# Patient Record
Sex: Female | Born: 1945
Health system: Southern US, Community
[De-identification: ages and names within clinical notes are randomized; demographics above are authoritative.]

## PROBLEM LIST (undated history)

## (undated) DIAGNOSIS — R0600 Dyspnea, unspecified: Secondary | ICD-10-CM

## (undated) DIAGNOSIS — F419 Anxiety disorder, unspecified: Secondary | ICD-10-CM

## (undated) DIAGNOSIS — D680 Von Willebrand's disease: Secondary | ICD-10-CM

## (undated) DIAGNOSIS — J449 Chronic obstructive pulmonary disease, unspecified: Secondary | ICD-10-CM

## (undated) DIAGNOSIS — E7870 Disorder of bile acid and cholesterol metabolism, unspecified: Secondary | ICD-10-CM

## (undated) DIAGNOSIS — J189 Pneumonia, unspecified organism: Secondary | ICD-10-CM

## (undated) DIAGNOSIS — M1990 Unspecified osteoarthritis, unspecified site: Secondary | ICD-10-CM

## (undated) DIAGNOSIS — E559 Vitamin D deficiency, unspecified: Secondary | ICD-10-CM

## (undated) DIAGNOSIS — J45909 Unspecified asthma, uncomplicated: Secondary | ICD-10-CM

## (undated) DIAGNOSIS — E785 Hyperlipidemia, unspecified: Secondary | ICD-10-CM

## (undated) DIAGNOSIS — I10 Essential (primary) hypertension: Secondary | ICD-10-CM

## (undated) DIAGNOSIS — E039 Hypothyroidism, unspecified: Secondary | ICD-10-CM

## (undated) HISTORY — DX: Vitamin D deficiency, unspecified: E55.9

## (undated) HISTORY — DX: Hyperlipidemia, unspecified: E78.5

## (undated) HISTORY — PX: BIOPSY ENDOMETRIAL: PRO11

## (undated) HISTORY — PX: TUBAL LIGATION: SHX77

## (undated) HISTORY — DX: Hypothyroidism, unspecified: E03.9

## (undated) HISTORY — DX: Disorder of bile acid and cholesterol metabolism, unspecified: E78.70

## (undated) HISTORY — DX: Chronic obstructive pulmonary disease, unspecified: J44.9

## (undated) HISTORY — DX: Essential (primary) hypertension: I10

## (undated) HISTORY — DX: Von Willebrand's disease: D68.0

---

## 1976-09-08 HISTORY — PX: APPENDECTOMY: SHX54

## 2016-09-18 DIAGNOSIS — R7989 Other specified abnormal findings of blood chemistry: Secondary | ICD-10-CM

## 2016-09-18 DIAGNOSIS — R778 Other specified abnormalities of plasma proteins: Secondary | ICD-10-CM | POA: Insufficient documentation

## 2017-02-27 LAB — TSH: TSH: 9.45 — AB (ref 0.41–5.90)

## 2017-06-11 ENCOUNTER — Encounter: Payer: Self-pay | Admitting: Physician Assistant

## 2018-06-18 LAB — TSH: TSH: 4.5 (ref 0.41–5.90)

## 2018-06-18 LAB — CBC AND DIFFERENTIAL
HEMATOCRIT: 38 (ref 36–46)
Hemoglobin: 12.6 (ref 12.0–16.0)
PLATELETS: 222 (ref 150–399)
WBC: 7.5

## 2018-06-18 LAB — HEPATIC FUNCTION PANEL
ALT: 11 (ref 7–35)
AST: 19 (ref 13–35)
Alkaline Phosphatase: 78 (ref 25–125)
Bilirubin, Total: 0.5

## 2018-06-18 LAB — BASIC METABOLIC PANEL
BUN: 21 (ref 4–21)
Creatinine: 0.9 (ref 0.5–1.1)
Glucose: 80
Potassium: 4.4 (ref 3.4–5.3)
Sodium: 141 (ref 137–147)

## 2018-06-18 LAB — LIPID PANEL
HDL: 48 (ref 35–70)
LDL Cholesterol: 129
Triglycerides: 127 (ref 40–160)

## 2018-06-18 LAB — VITAMIN D 25 HYDROXY (VIT D DEFICIENCY, FRACTURES): Vit D, 25-Hydroxy: 36.9

## 2018-11-03 ENCOUNTER — Encounter: Payer: Self-pay | Admitting: Family Medicine

## 2018-11-03 ENCOUNTER — Ambulatory Visit (INDEPENDENT_AMBULATORY_CARE_PROVIDER_SITE_OTHER): Payer: Medicare HMO

## 2018-11-03 ENCOUNTER — Ambulatory Visit (INDEPENDENT_AMBULATORY_CARE_PROVIDER_SITE_OTHER): Payer: Medicare HMO | Admitting: Family Medicine

## 2018-11-03 ENCOUNTER — Other Ambulatory Visit: Payer: Self-pay | Admitting: Family Medicine

## 2018-11-03 VITALS — BP 115/80 | HR 90 | Wt 199.0 lb

## 2018-11-03 DIAGNOSIS — E039 Hypothyroidism, unspecified: Secondary | ICD-10-CM | POA: Diagnosis not present

## 2018-11-03 DIAGNOSIS — G8929 Other chronic pain: Secondary | ICD-10-CM

## 2018-11-03 DIAGNOSIS — D68318 Other hemorrhagic disorder due to intrinsic circulating anticoagulants, antibodies, or inhibitors: Secondary | ICD-10-CM | POA: Insufficient documentation

## 2018-11-03 DIAGNOSIS — E7870 Disorder of bile acid and cholesterol metabolism, unspecified: Secondary | ICD-10-CM | POA: Diagnosis not present

## 2018-11-03 DIAGNOSIS — J432 Centrilobular emphysema: Secondary | ICD-10-CM | POA: Insufficient documentation

## 2018-11-03 DIAGNOSIS — M160 Bilateral primary osteoarthritis of hip: Secondary | ICD-10-CM | POA: Diagnosis not present

## 2018-11-03 DIAGNOSIS — I10 Essential (primary) hypertension: Secondary | ICD-10-CM | POA: Diagnosis not present

## 2018-11-03 DIAGNOSIS — J449 Chronic obstructive pulmonary disease, unspecified: Secondary | ICD-10-CM | POA: Diagnosis not present

## 2018-11-03 DIAGNOSIS — Z1159 Encounter for screening for other viral diseases: Secondary | ICD-10-CM

## 2018-11-03 DIAGNOSIS — E790 Hyperuricemia without signs of inflammatory arthritis and tophaceous disease: Secondary | ICD-10-CM | POA: Insufficient documentation

## 2018-11-03 DIAGNOSIS — D6800 Von Willebrand disease, unspecified: Secondary | ICD-10-CM

## 2018-11-03 DIAGNOSIS — E785 Hyperlipidemia, unspecified: Secondary | ICD-10-CM

## 2018-11-03 DIAGNOSIS — M25551 Pain in right hip: Secondary | ICD-10-CM | POA: Diagnosis not present

## 2018-11-03 DIAGNOSIS — R0902 Hypoxemia: Secondary | ICD-10-CM | POA: Insufficient documentation

## 2018-11-03 DIAGNOSIS — E559 Vitamin D deficiency, unspecified: Secondary | ICD-10-CM | POA: Diagnosis not present

## 2018-11-03 DIAGNOSIS — M25552 Pain in left hip: Principal | ICD-10-CM

## 2018-11-03 DIAGNOSIS — E7879 Other disorders of bile acid and cholesterol metabolism: Secondary | ICD-10-CM

## 2018-11-03 HISTORY — DX: Hyperlipidemia, unspecified: E78.5

## 2018-11-03 HISTORY — DX: Hypothyroidism, unspecified: E03.9

## 2018-11-03 HISTORY — DX: Von Willebrand's disease: D68.0

## 2018-11-03 HISTORY — DX: Vitamin D deficiency, unspecified: E55.9

## 2018-11-03 HISTORY — DX: Essential (primary) hypertension: I10

## 2018-11-03 HISTORY — DX: Von Willebrand disease, unspecified: D68.00

## 2018-11-03 HISTORY — DX: Other disorders of bile acid and cholesterol metabolism: E78.79

## 2018-11-03 HISTORY — DX: Chronic obstructive pulmonary disease, unspecified: J44.9

## 2018-11-03 LAB — URIC ACID: Uric Acid: 8.5

## 2018-11-03 MED ORDER — COLCHICINE 0.6 MG PO TABS: 0.6000 mg | ORAL_TABLET | Freq: Every day | ORAL | 1 refills | Status: DC

## 2018-11-03 NOTE — Progress Notes (Signed)
Subjective:    CC: Bilateral hip pain  HPI: Jennifer [name] presents to clinic for first visit for hip pain.  She recently moved to the area from the [place] area.  She has an extensive medical history significant for COPD hypertension and hypothyroidism.  She has been having hip pain since about November.  She is not had much evaluation for it and has tried using Tylenol ibuprofen and Aleve which helps only a little.  She notes anterior hip pain worse when she stands from a seated position.  She notes the pain is mostly in the anterior hip.  She denies any radiating pain below the level of her knee or distal weakness or numbness.  She is not had evaluation for this problem yet.  She has scheduled her first visit with my partner Rosita Kea PA on March 6. Jennifer [name] brings a printout of the last several years of her primary care office visits.  Past medical history, Surgical history, Family history not pertinant except as noted below, Social history, Allergies, and medications have been entered into the medical record, reviewed, and no changes needed.   Review of Systems: No headache, visual changes, nausea, vomiting, diarrhea, constipation, dizziness, abdominal pain, skin rash, fevers, chills, night sweats, weight loss, swollen lymph nodes, body aches, joint swelling, muscle aches, chest pain, shortness of breath, mood changes, visual or auditory hallucinations.   Objective:    Vitals:   11/03/18 1056 11/03/18 1129  BP: 115/80   Pulse: (!) 139 90   General: Well Developed, well nourished, and in no acute distress.  Neuro/Psych: Alert and oriented x3, extra-ocular muscles intact, able to move all 4 extremities, sensation grossly intact. Skin: Warm and dry, no rashes noted.  Respiratory: Not using accessory muscles, speaking in full sentences, trachea midline.  Cardiovascular: Pulses palpable, no extremity edema. Abdomen: Does not appear distended. MSK:  Hips bilaterally  normal-appearing. Right hip: Significant limited rotational range of motion with mild. Decreased flexion range of motion with mild pain.  Left hip: Significant limited rotational range of motion and decreased hip flexion range of motion.  Motion produces pain as well.  Pain with strength testing.  Patient ambulates with a cane with a significant antalgic gait.  Lab and Radiology Results X-ray images hips bilaterally personally independently reviewed  Right hip: Degenerative changes with dysplasia appearance.  Possible AVN with partial collapse appearance as well.  Left hip: Degenerative changes without significant dysplasia or AVN appearance.  No acute fractures.  Await formal radiology review  Impression and Recommendations:    Assessment and Plan: 73 y.o. female with  Hip pain bilateral: Based on physical exam and history DJD is very likely.  She does have some evidence of hip dysplasia on x-ray per my read and possible AVN per my read.  Await formal radiology review.  Additionally she has history of elevated uric acid and this could be gout.  Gout is less likely in the hip however.  Reasonable to try treatment with colchicine.  Likely will proceed with physical therapy as well.  Recheck in 1 month.  Additionally will check basic fasting labs listed below to follow-up hypothyroidism gout hyperlipidemia etc.  She has a follow-up appointment with her new primary care provider sixth..   Orders Placed This Encounter  Procedures  . DG HIPS BILAT WITH PELVIS 3-4 VIEWS    Standing Status:   Future    Number of Occurrences:   1    Standing Expiration Date:   01/02/2020  Order Specific Question:   Reason for Exam (SYMPTOM  OR DIAGNOSIS REQUIRED)    Answer:   eval pain hips bilateral    Order Specific Question:   Preferred imaging location?    Answer:   [facility]    Order Specific Question:   Radiology Contrast Protocol - do NOT remove file path    Answer:    [email or web address]  . CBC and differential    This external order was created through the Results Console.  Marland Kitchen VITAMIN D 25 Hydroxy (Vit-D Deficiency, Fractures)    This external order was created through the Results Console.  . Basic metabolic panel    This external order was created through the Results Console.  . Lipid panel    This external order was created through the Results Console.  . Hepatic function panel    This external order was created through the Results Console.  . TSH    This external order was created through the Results Console.  . TSH    This external order was created through the Results Console.  Marland Kitchen Uric acid    This order was created through External Result Entry  . CBC  . COMPLETE METABOLIC PANEL WITH GFR  . Lipid Panel w/reflex Direct LDL  . TSH  . T4, free  . T3, free  . Hepatitis C antibody  . Uric acid   Meds ordered this encounter  Medications  . colchicine 0.6 MG tablet    Sig: Take 1 tablet (0.6 mg total) by mouth daily.    Dispense:  30 tablet    Refill:  1    Discussed warning signs or symptoms. Please see discharge instructions. Patient expresses understanding.

## 2018-11-03 NOTE — Patient Instructions (Addendum)
Thank you for coming in today. Start colchicine daily for pain,  I will get xray results to you ASAP.  If not controlled we can do an injection in the hip here.   Get labs and follow up with [facility] as scheduled.   Recheck with me in 4 weeks or sooner if needed.    Avascular Necrosis Avascular necrosis is death of bone tissue due to lack of blood supply. This condition may also be called:  Osteonecrosis.  Aseptic necrosis.  Ischemic bone necrosis. Without proper blood supply, the internal layer of the affected bone dies. Over time, small breaks form in the outer layer of the bone. If this process affects a bone near a joint, the joint may collapse or move out of place (become dislocated). Joints that may be affected include the jaw, wrist, fingers, knee, and foot. Avascular necrosis most commonly affects:  The hip joint, especially the top of the thigh bone (femoral head).  The top of the upper arm bone (humeral head). What are the causes? This condition may be caused by:  Damage or injury to a bone or joint.  Using steroid medicine such as prednisone for a long time.  Changes in the body's disease-fighting system (immune system).  Changes in the body's system of chemicals that regulate body processes (hormones).  A lot of exposure to radiation. What increases the risk? The following factors may make you more likely to develop this condition:  Alcohol abuse.  A history of joint injury.  Long-term or frequent use of steroid medicines.  Having a medical condition such as: ? HIV or AIDS. ? Diabetes. ? Sickle cell disease. ? A disease in which your body's immune system attacks your body's own healthy tissues (autoimmune disease). What are the signs or symptoms? The main symptoms of avascular necrosis are:  Pain. If an affected joint collapses, the pain may suddenly get severe.  Decreased ability to move the affected bone or joint. How is this diagnosed? Avascular  necrosis may be diagnosed based on:  Your symptoms and medical history.  A physical exam.  Imaging tests, such as X-rays, bone scans, and an MRI. How is this treated? Treatment for this condition may include:  Pain medicine, such as NSAIDs.  Medicine to improve bone growth.  Avoiding placing any pressure or weight on the affected area. If avascular necrosis occurs in your hip, ankle, or foot, you may need to use a device to help you move around (assistive device), such as crutches or a rolling scooter.  Physical therapy to help regain strength and motion in the affected area.  Surgery, such as: ? Core decompression. In this surgery, one or more holes are placed in the bone for new blood vessels to grow into. This provides a renewed blood supply to the bone. This surgery may reduce pain and pressure in the affected bone and slow the destruction of bones and joints. ? Osteotomy. In this surgery, the bone is reshaped to reduce stress on the affected area of the joint. ? Bone grafting. In this surgery, healthy bone from a different part of your body is used to replace damaged bone. ? Total joint replacement (arthroplasty). In this surgery, the affected surfaces of bone on one or both sides of a joint are replaced with artificial parts (prostheses).  Electrical stimulation (also called e-stim). During this procedure, a probe over the skin sends shock waves into the body. This may help to encourage new bone growth.  High-pressure oxygen therapy (hyperbaric  oxygen). This is rarely used. Follow these instructions at home: Activity  Ask your health care provider what activities are safe for you.  If physical therapy was prescribed, do exercises as told by your health care provider.  Avoid placing any pressure or weight on the affected area, as told by your health care provider. Use assistive devices as instructed. Managing pain, stiffness, and swelling  If directed, apply heat to the  affected area as often as told by your health care provider. Heat can reduce the stiffness of your muscles and joints. Use the heat source that your health care provider recommends, such as a moist heat pack or a heating pad. ? Place a towel between your skin and the heat source. ? Leave the heat on for 20-30 minutes. ? Remove the heat if your skin turns bright red. This is especially important if you are unable to feel pain, heat, or cold. You may have a greater risk of getting burned.  If directed, put ice on affected areas. Icing can help to relieve joint pain and swelling. ? Put ice in a plastic bag. ? Place a towel between your skin and the bag. ? Leave the ice on for 20 minutes, 2-3 times a day. General instructions   Take over-the-counter and prescription medicines only as told by your health care provider.  Do not drive or use heavy machinery while taking prescription pain medicine.  If a bone in your arm or leg is affected, ask your health care provider if it is safe for you to drive.  Do not use any products that contain nicotine or tobacco, such as cigarettes and e-cigarettes. These can delay bone healing. If you need help quitting, ask your health care provider.  Do not drink alcohol.  Keep all follow-up visits as told by your health care provider. This is important. Contact a health care provider if:  You have pain that gets worse or does not get better with medicine.  Your ability to move your joint gets worse. Get help right away if:  Your pain suddenly becomes severe. Summary  Avascular necrosis is death of bone tissue due to a lack of blood supply.  Without proper blood supply, the internal layer of the affected bone dies. Over time, small breaks form in the outer layer of the bone.  Avoid putting any pressure or weight on the affected area. You may need to use a device to help you move around (assistive device), such as crutches or a rolling scooter. This  information is not intended to replace advice given to you by your health care provider. Make sure you discuss any questions you have with your health care provider. Document Released: 02/14/2002 Document Revised: 08/04/2017 Document Reviewed: 08/04/2017 Elsevier Interactive Patient Education  2019 [employer]  Gout  Gout is a condition that causes painful swelling of the joints. Gout is a type of inflammation of the joints (arthritis). This condition is caused by having too much uric acid in the body. Uric acid is a chemical that forms when the body breaks down substances called purines. Purines are important for building body proteins. When the body has too much uric acid, sharp crystals can form and build up inside the joints. This causes pain and swelling. Gout attacks can happen quickly and may be very painful (acute gout). Over time, the attacks can affect more joints and become more frequent (chronic gout). Gout can also cause uric acid to build up under the skin and inside  the kidneys. What are the causes? This condition is caused by too much uric acid in your blood. This can happen because:  Your kidneys do not remove enough uric acid from your blood. This is the most common cause.  Your body makes too much uric acid. This can happen with some cancers and cancer treatments. It can also occur if your body is breaking down too many red blood cells (hemolytic anemia).  You eat too many foods that are high in purines. These foods include organ meats and some seafood. Alcohol, especially beer, is also high in purines. A gout attack may be triggered by trauma or stress. What increases the risk? You are more likely to develop this condition if you:  Have a family history of gout.  Are female and middle-aged.  Are female and have gone through menopause.  Are obese.  Frequently drink alcohol, especially beer.  Are dehydrated.  Lose weight too quickly.  Have an organ  transplant.  Have lead poisoning.  Take certain medicines, including aspirin, cyclosporine, diuretics, levodopa, and niacin.  Have kidney disease.  Have a skin condition called psoriasis. What are the signs or symptoms? An attack of acute gout happens quickly. It usually occurs in just one joint. The most common place is the big toe. Attacks often start at night. Other joints that may be affected include joints of the feet, ankle, knee, fingers, wrist, or elbow. Symptoms of this condition may include:  Severe pain.  Warmth.  Swelling.  Stiffness.  Tenderness. The affected joint may be very painful to touch.  Shiny, red, or purple skin.  Chills and fever. Chronic gout may cause symptoms more frequently. More joints may be involved. You may also have white or yellow lumps (tophi) on your hands or feet or in other areas near your joints. How is this diagnosed? This condition is diagnosed based on your symptoms, medical history, and physical exam. You may have tests, such as:  Blood tests to measure uric acid levels.  Removal of joint fluid with a thin needle (aspiration) to look for uric acid crystals.  X-rays to look for joint damage. How is this treated? Treatment for this condition has two phases: treating an acute attack and preventing future attacks. Acute gout treatment may include medicines to reduce pain and swelling, including:  NSAIDs.  Steroids. These are strong anti-inflammatory medicines that can be taken by mouth (orally) or injected into a joint.  Colchicine. This medicine relieves pain and swelling when it is taken soon after an attack. It can be given by mouth or through an IV. Preventive treatment may include:  Daily use of smaller doses of NSAIDs or colchicine.  Use of a medicine that reduces uric acid levels in your blood.  Changes to your diet. You may need to see a dietitian about what to eat and drink to prevent gout. Follow these instructions at  home: During a gout attack   If directed, put ice on the affected area: ? Put ice in a plastic bag. ? Place a towel between your skin and the bag. ? Leave the ice on for 20 minutes, 2-3 times a day.  Raise (elevate) the affected joint above the level of your heart as often as possible.  Rest the joint as much as possible. If the affected joint is in your leg, you may be given crutches to use.  Follow instructions from your health care provider about eating or drinking restrictions. Avoiding future gout attacks  Follow  a low-purine diet as told by your dietitian or health care provider. Avoid foods and drinks that are high in purines, including liver, kidney, anchovies, asparagus, herring, mushrooms, mussels, and beer.  Maintain a healthy weight or lose weight if you are overweight. If you want to lose weight, talk with your health care provider. It is important that you do not lose weight too quickly.  Start or maintain an exercise program as told by your health care provider. Eating and drinking  Drink enough fluids to keep your urine pale yellow.  If you drink alcohol: ? Limit how much you use to:  0-1 drink a day for women.  0-2 drinks a day for men. ? Be aware of how much alcohol is in your drink. In the [place] one drink equals one 12 oz bottle of beer (355 mL) one 5 oz glass of wine (148 mL), or one 1 oz glass of hard liquor (44 mL). General instructions  Take over-the-counter and prescription medicines only as told by your health care provider.  Do not drive or use heavy machinery while taking prescription pain medicine.  Return to your normal activities as told by your health care provider. Ask your health care provider what activities are safe for you.  Keep all follow-up visits as told by your health care provider. This is important. Contact a health care provider if you have:  Another gout attack.  Continuing symptoms of a gout attack after 10 days of  treatment.  Side effects from your medicines.  Chills or a fever.  Burning pain when you urinate.  Pain in your lower back or belly. Get help right away if you:  Have severe or uncontrolled pain.  Cannot urinate. Summary  Gout is painful swelling of the joints caused by inflammation.  The most common site of pain is the big toe, but it can affect other joints in the body.  Medicines and dietary changes can help to prevent and treat gout attacks. This information is not intended to replace advice given to you by your health care provider. Make sure you discuss any questions you have with your health care provider. Document Released: 08/22/2000 Document Revised: 03/17/2018 Document Reviewed: 03/17/2018 Elsevier Interactive Patient Education  2019 [employer].

## 2018-11-04 ENCOUNTER — Telehealth: Payer: Self-pay | Admitting: Family Medicine

## 2018-11-04 DIAGNOSIS — M25551 Pain in right hip: Secondary | ICD-10-CM

## 2018-11-04 DIAGNOSIS — M25552 Pain in left hip: Principal | ICD-10-CM

## 2018-11-04 DIAGNOSIS — M160 Bilateral primary osteoarthritis of hip: Secondary | ICD-10-CM

## 2018-11-04 LAB — COMPLETE METABOLIC PANEL WITH GFR
AG RATIO: 1.6 (calc) (ref 1.0–2.5)
ALT: 15 U/L (ref 6–29)
AST: 16 U/L (ref 10–35)
Albumin: 4.4 g/dL (ref 3.6–5.1)
Alkaline phosphatase (APISO): 95 U/L (ref 37–153)
BUN/Creatinine Ratio: 30 (calc) — ABNORMAL HIGH (ref 6–22)
BUN: 27 mg/dL — ABNORMAL HIGH (ref 7–25)
CO2: 25 mmol/L (ref 20–32)
Calcium: 10.7 mg/dL — ABNORMAL HIGH (ref 8.6–10.4)
Chloride: 100 mmol/L (ref 98–110)
Creat: 0.9 mg/dL (ref 0.60–0.93)
GFR, Est African American: 75 mL/min/{1.73_m2} (ref >=60)
GFR, Est Non African American: 65 mL/min/{1.73_m2} (ref >=60)
Globulin: 2.8 g/dL (calc) (ref 1.9–3.7)
Glucose, Bld: 91 mg/dL (ref 65–99)
POTASSIUM: 3.9 mmol/L (ref 3.5–5.3)
Sodium: 138 mmol/L (ref 135–146)
Total Bilirubin: 0.7 mg/dL (ref 0.2–1.2)
Total Protein: 7.2 g/dL (ref 6.1–8.1)

## 2018-11-04 LAB — T4, FREE: Free T4: 1.4 ng/dL (ref 0.8–1.8)

## 2018-11-04 LAB — CBC
HCT: 37.3 % (ref 35.0–45.0)
Hemoglobin: 13.1 g/dL (ref 11.7–15.5)
MCH: 32.2 pg (ref 27.0–33.0)
MCHC: 35.1 g/dL (ref 32.0–36.0)
MCV: 91.6 fL (ref 80.0–100.0)
MPV: 9.8 fL (ref 7.5–12.5)
Platelets: 241 10*3/uL (ref 140–400)
RBC: 4.07 10*6/uL (ref 3.80–5.10)
RDW: 12 % (ref 11.0–15.0)
WBC: 9.8 10*3/uL (ref 3.8–10.8)

## 2018-11-04 LAB — LIPID PANEL W/REFLEX DIRECT LDL
Cholesterol: 213 mg/dL — ABNORMAL HIGH (ref <200)
HDL: 53 mg/dL (ref >=50)
LDL Cholesterol (Calc): 137 mg/dL (calc) — ABNORMAL HIGH
Non-HDL Cholesterol (Calc): 160 mg/dL (calc) — ABNORMAL HIGH (ref <130)
Total CHOL/HDL Ratio: 4 (calc) (ref <5.0)
Triglycerides: 113 mg/dL (ref <150)

## 2018-11-04 LAB — TSH: TSH: 4.2 mIU/L (ref 0.40–4.50)

## 2018-11-04 LAB — URIC ACID: Uric Acid, Serum: 6.3 mg/dL (ref 2.5–7.0)

## 2018-11-04 LAB — HEPATITIS C ANTIBODY
Hepatitis C Ab: NONREACTIVE
SIGNAL TO CUT-OFF: 0.01 (ref <1.00)

## 2018-11-04 LAB — T3, FREE: T3, Free: 2.9 pg/mL (ref 2.3–4.2)

## 2018-11-04 NOTE — Telephone Encounter (Signed)
Referral to physical therapy placed today.

## 2018-11-12 ENCOUNTER — Encounter: Payer: Self-pay | Admitting: Physician Assistant

## 2018-11-12 ENCOUNTER — Ambulatory Visit (INDEPENDENT_AMBULATORY_CARE_PROVIDER_SITE_OTHER): Payer: Medicare HMO | Admitting: Physician Assistant

## 2018-11-12 VITALS — BP 129/82 | HR 92 | Resp 14 | Ht 66.0 in | Wt 198.0 lb

## 2018-11-12 DIAGNOSIS — Z9981 Dependence on supplemental oxygen: Secondary | ICD-10-CM

## 2018-11-12 DIAGNOSIS — Z7689 Persons encountering health services in other specified circumstances: Secondary | ICD-10-CM

## 2018-11-12 DIAGNOSIS — J432 Centrilobular emphysema: Secondary | ICD-10-CM | POA: Diagnosis not present

## 2018-11-12 DIAGNOSIS — I10 Essential (primary) hypertension: Secondary | ICD-10-CM | POA: Diagnosis not present

## 2018-11-12 DIAGNOSIS — Z9109 Other allergy status, other than to drugs and biological substances: Secondary | ICD-10-CM | POA: Diagnosis not present

## 2018-11-12 MED ORDER — LISINOPRIL-HYDROCHLOROTHIAZIDE 20-25 MG PO TABS: 1.0000 | ORAL_TABLET | Freq: Every day | ORAL | 1 refills | Status: DC

## 2018-11-12 MED ORDER — MONTELUKAST SODIUM 10 MG PO TABS: 10.0000 mg | ORAL_TABLET | Freq: Every day | ORAL | 2 refills | Status: DC

## 2018-11-12 MED ORDER — ALBUTEROL SULFATE HFA 108 (90 BASE) MCG/ACT IN AERS: 1.0000 | INHALATION_SPRAY | RESPIRATORY_TRACT | 5 refills | Status: DC | PRN

## 2018-11-12 MED ORDER — SYMBICORT 160-4.5 MCG/ACT IN AERO: 2.0000 | INHALATION_SPRAY | Freq: Two times a day (BID) | RESPIRATORY_TRACT | 6 refills | Status: DC

## 2018-11-12 NOTE — Progress Notes (Signed)
HPI:                                                                Jennifer [name] is a 73 y.o. female who presents to [facility]: Primary Care Sports Medicine today to establish care   History of Thyroid Disease: reports she has taken Levothyroxine in the past, but has not taken thyroid replacement since 2018 when TSH was 9.45. Denies chest pain, heart palpitations, tremor, GI symptoms, or skin changes. No family hx of thyroid disease.  COPD Reports last exacerbation in January 2018 - she was admitted for 17 days CT chest showed moderate centrilobular emphysematous change She uses home oxygen as needed, mainly when she is cleaning her house. Usually needs 3L Used to be on Spiriva but she was switched to Symbicort due to cost.  States she does not want a Pulmonologist She does not have any wheezing Needs a refill of Ventolin. On average uses 1 inhaler every 3 months  HTN: taking Lisinopril-HCTZ daily. Compliant with medications. Endorses history of white coat hypertension. Denies vision change, headache, chest pain with exertion, orthopnea, lightheadedness, syncope and edema. Risk factors include: postmenopausal, obesity, HLD  Depression screen [facility] 2/9 11/12/2018  Decreased Interest 0  Down, Depressed, Hopeless 0  PHQ - 2 Score 0    No flowsheet data found.    Past Medical History:  Diagnosis Date  . COPD (chronic obstructive pulmonary disease) (HCC) 11/03/2018  . Familial hypercholanemia 11/03/2018  . HLD (hyperlipidemia) 11/03/2018  . HTN (hypertension) 11/03/2018  . Hypothyroid 11/03/2018  . Vitamin D deficiency 11/03/2018  . Von Willebrand disease (HCC) possible 11/03/2018   Seen in documentation from prior provider   Past Surgical History:  Procedure Laterality Date  . APPENDECTOMY  1978  . BIOPSY ENDOMETRIAL    . CESAREAN SECTION     triplets  . TUBAL LIGATION     Social History   Tobacco Use  . Smoking status: Former Smoker    Packs/day: 0.50     Years: 15.00    Pack years: 7.50    Types: Cigarettes    Last attempt to quit: 07/22/2014    Years since quitting: 4.3  . Smokeless tobacco: Never Used  Substance Use Topics  . Alcohol use: Not on file   family history includes Asthma in her brother; Cancer in her father; Parkinson's disease in her mother.   Review of Systems  Respiratory: Positive for shortness of breath and wheezing (asthma).   Musculoskeletal: Positive for arthralgias.  All other systems reviewed and are negative.    Medications: Current Outpatient Medications  Medication Sig Dispense Refill  . acetaminophen (TYLENOL) 650 MG CR tablet Take 650 mg by mouth every 8 (eight) hours as needed.    . naproxen sodium (ALEVE) 220 MG tablet Take 220 mg by mouth at bedtime.    Marland Kitchen albuterol (PROVENTIL HFA;VENTOLIN HFA) 108 (90 Base) MCG/ACT inhaler Inhale 1-2 puffs into the lungs every 4 (four) hours as needed for wheezing or shortness of breath. 1 Inhaler 5  . ALPRAZolam (XANAX) 0.5 MG tablet Take by mouth.    . colchicine 0.6 MG tablet Take 1 tablet (0.6 mg total) by mouth daily. 30 tablet 1  . lisinopril-hydrochlorothiazide (PRINZIDE,ZESTORETIC) 20-25 MG tablet Take 1 tablet  by mouth daily. 90 tablet 1  . montelukast (SINGULAIR) 10 MG tablet Take 1 tablet (10 mg total) by mouth at bedtime. 90 tablet 2  . SYMBICORT 160-4.5 MCG/ACT inhaler Inhale 2 puffs into the lungs 2 (two) times daily. 1 Inhaler 6   No current facility-administered medications for this visit.    Allergies  Allergen Reactions  . Prednisone Other (See Comments)    Other reaction(s): psychological reaction, hallucinations  . Statins Other (See Comments)    Other reaction(s): muscle/joint pain Weakness and tiredness       Objective:  BP 129/82   Pulse 92   Resp 14   Ht  (1.676 m)   Wt 198 lb (89.8 kg)   SpO2 95%   BMI 31.96 kg/m  Gen:  alert, not ill-appearing, no distress, appropriate for age, obese female HEENT: head  normocephalic without obvious abnormality, conjunctiva and cornea clear, wearing glasses, trachea midline Pulm: Normal work of breathing, normal phonation, clear to auscultation bilaterally, no wheezes, rales or rhonchi CV: Normal rate, regular rhythm, s1 and s2 distinct, no murmurs, clicks or rubs  Neuro: alert and oriented x 3, no tremor MSK: extremities atraumatic, normal gait and station, no peripheral edema Skin: intact, no rashes on exposed skin, no jaundice, no cyanosis Psych: well-groomed, cooperative, good eye contact, euthymic mood, affect mood-congruent, speech is articulate, and thought processes clear and goal-directed  Lab Results  Component Value Date   CREATININE 0.90 11/03/2018   BUN 27 (H) 11/03/2018   NA 138 11/03/2018   K 3.9 11/03/2018   CL 100 11/03/2018   CO2 25 11/03/2018   Lab Results  Component Value Date   ALT 15 11/03/2018   AST 16 11/03/2018   ALKPHOS 78 06/18/2018   BILITOT 0.7 11/03/2018   Lab Results  Component Value Date   TSH 4.20 11/03/2018   Lab Results  Component Value Date   LABURIC 6.3 11/03/2018     No results found for this or any previous visit (from the past 72 hour(s)). No results found.    Assessment and Plan: 73 y.o. female with   .[name] was seen today for establish care.  Diagnoses and all orders for this visit:  Encounter to establish care  White coat syndrome with diagnosis of hypertension  Centrilobular emphysema (HCC) -     albuterol (PROVENTIL HFA;VENTOLIN HFA) 108 (90 Base) MCG/ACT inhaler; Inhale 1-2 puffs into the lungs every 4 (four) hours as needed for wheezing or shortness of breath. -     SYMBICORT 160-4.5 MCG/ACT inhaler; Inhale 2 puffs into the lungs 2 (two) times daily.  Hypercalcemia -     PTH, Intact and Calcium  Hypertension goal BP (blood pressure) < 140/90 -     lisinopril-hydrochlorothiazide (PRINZIDE,ZESTORETIC) 20-25 MG tablet; Take 1 tablet by mouth daily.  Environmental allergies -      montelukast (SINGULAIR) 10 MG tablet; Take 1 tablet (10 mg total) by mouth at bedtime.  On home oxygen therapy Comments: 3L prn   - Personally reviewed PMH, PSH, PFH, medications, allergies, HM - Age-appropriate cancer screening: overdue for mammogram and colon cancer screening; patient states she does not wish to do any cancer screening because she is very aware of her body. Explained that many early cancers, which are treatable, are asymptomatic. Risks and benefits of screening explained - Tdap UTD - Declines influenza and pneumonia vaccines - PHQ2 negative - Personally reviewed lab results from 11/03/2018. Explained mild elevation in calcium and recommended f/u PTH and  intact calcium test. Patient deferred today - Refills provided as above    Patient education and anticipatory guidance given Patient agrees with treatment plan Follow-up in 6 months for med mgmt or sooner as needed if symptoms worsen or fail to improve  Levonne Hubert PA-C

## 2018-11-12 NOTE — Patient Instructions (Addendum)
For your blood pressure: - Goal <140/90 (Ideally 120's/70's) - take your blood pressure medication in the morning (unless instructed differently) - monitor and log blood pressures at home - check around the same time each day in a relaxed setting - Limit salt to <2500 mg/day - Follow DASH (Dietary Approach to Stopping Hypertension) eating plan - Try to get at least 150 minutes of aerobic exercise per week - Aim to go on a brisk walk 30 minutes per day at least 5 days per week. If you're not active, gradually increase how long you walk by 5 minutes each week - limit alcohol: 2 standard drinks per day for men and 1 per day for women - avoid tobacco/nicotine products. Consider smoking cessation if you smoke - weight loss: 7% of current body weight can reduce your blood pressure by 5-10 points - follow-up at least every 6 months for your blood pressure. Follow-up sooner if your BP is not controlled   DASH Eating Plan DASH stands for "Dietary Approaches to Stop Hypertension." The DASH eating plan is a healthy eating plan that has been shown to reduce high blood pressure (hypertension). It may also reduce your risk for type 2 diabetes, heart disease, and stroke. The DASH eating plan may also help with weight loss. What are tips for following this plan?  General guidelines  Avoid eating more than 2,300 mg (milligrams) of salt (sodium) a day. If you have hypertension, you may need to reduce your sodium intake to 1,500 mg a day.  Limit alcohol intake to no more than 1 drink a day for nonpregnant women and 2 drinks a day for men. One drink equals 12 oz of beer, 5 oz of wine, or 1 oz of hard liquor.  Work with your health care provider to maintain a healthy body weight or to lose weight. Ask what an ideal weight is for you.  Get at least 30 minutes of exercise that causes your heart to beat faster (aerobic exercise) most days of the week. Activities may include walking, swimming, or biking.  Work  with your health care provider or diet and nutrition specialist (dietitian) to adjust your eating plan to your individual calorie needs. Reading food labels   Check food labels for the amount of sodium per serving. Choose foods with less than 5 percent of the Daily Value of sodium. Generally, foods with less than 300 mg of sodium per serving fit into this eating plan.  To find whole grains, look for the word "whole" as the first word in the ingredient list. Shopping  Buy products labeled as "low-sodium" or "no salt added."  Buy fresh foods. Avoid canned foods and premade or frozen meals. Cooking  Avoid adding salt when cooking. Use salt-free seasonings or herbs instead of table salt or sea salt. Check with your health care provider or pharmacist before using salt substitutes.  Do not fry foods. Cook foods using healthy methods such as baking, boiling, grilling, and broiling instead.  Cook with heart-healthy oils, such as olive, canola, soybean, or sunflower oil. Meal planning  Eat a balanced diet that includes: ? 5 or more servings of fruits and vegetables each day. At each meal, try to fill half of your plate with fruits and vegetables. ? Up to 6-8 servings of whole grains each day. ? Less than 6 oz of lean meat, poultry, or fish each day. A 3-oz serving of meat is about the same size as a deck of cards. One egg equals 1  oz. ? 2 servings of low-fat dairy each day. ? A serving of nuts, seeds, or beans 5 times each week. ? Heart-healthy fats. Healthy fats called Omega-3 fatty acids are found in foods such as flaxseeds and coldwater fish, like sardines, salmon, and mackerel.  Limit how much you eat of the following: ? Canned or prepackaged foods. ? Food that is high in trans fat, such as fried foods. ? Food that is high in saturated fat, such as fatty meat. ? Sweets, desserts, sugary drinks, and other foods with added sugar. ? Full-fat dairy products.  Do not salt foods before  eating.  Try to eat at least 2 vegetarian meals each week.  Eat more home-cooked food and less restaurant, buffet, and fast food.  When eating at a restaurant, ask that your food be prepared with less salt or no salt, if possible. What foods are recommended? The items listed may not be a complete list. Talk with your dietitian about what dietary choices are best for you. Grains Whole-grain or whole-wheat bread. Whole-grain or whole-wheat pasta. Brown rice. [place]row. Bulgur. Whole-grain and low-sodium cereals. Pita bread. Low-fat, low-sodium crackers. Whole-wheat flour tortillas. Vegetables Fresh or frozen vegetables (raw, steamed, roasted, or grilled). Low-sodium or reduced-sodium tomato and vegetable juice. Low-sodium or reduced-sodium tomato sauce and tomato paste. Low-sodium or reduced-sodium canned vegetables. Fruits All fresh, dried, or frozen fruit. Canned fruit in natural juice (without added sugar). Meat and other protein foods Skinless chicken or [place]. Ground chicken or [place]. Pork with fat trimmed off. Fish and seafood. Egg whites. Dried beans, peas, or lentils. Unsalted nuts, nut butters, and seeds. Unsalted canned beans. Lean cuts of beef with fat trimmed off. Low-sodium, lean deli meat. Dairy Low-fat (1%) or fat-free (skim) milk. Fat-free, low-fat, or reduced-fat cheeses. Nonfat, low-sodium ricotta or cottage cheese. Low-fat or nonfat yogurt. Low-fat, low-sodium cheese. Fats and oils Soft margarine without trans fats. Vegetable oil. Low-fat, reduced-fat, or light mayonnaise and salad dressings (reduced-sodium). Canola, safflower, olive, soybean, and sunflower oils. Avocado. Seasoning and other foods Herbs. Spices. Seasoning mixes without salt. Unsalted popcorn and pretzels. Fat-free sweets. What foods are not recommended? The items listed may not be a complete list. Talk with your dietitian about what dietary choices are best for you. Grains Baked goods made with fat,  such as croissants, muffins, or some breads. Dry pasta or rice meal packs. Vegetables Creamed or fried vegetables. Vegetables in a cheese sauce. Regular canned vegetables (not low-sodium or reduced-sodium). Regular canned tomato sauce and paste (not low-sodium or reduced-sodium). Regular tomato and vegetable juice (not low-sodium or reduced-sodium). Angie Fava. Olives. Fruits Canned fruit in a light or heavy syrup. Fried fruit. Fruit in cream or butter sauce. Meat and other protein foods Fatty cuts of meat. Ribs. Fried meat. Berniece Salines. Sausage. Bologna and other processed lunch meats. Salami. Fatback. Hotdogs. Bratwurst. Salted nuts and seeds. Canned beans with added salt. Canned or smoked fish. Whole eggs or egg yolks. Chicken or [place] with skin. Dairy Whole or 2% milk, cream, and half-and-half. Whole or full-fat cream cheese. Whole-fat or sweetened yogurt. Full-fat cheese. Nondairy creamers. Whipped toppings. Processed cheese and cheese spreads. Fats and oils Butter. Stick margarine. Lard. Shortening. Ghee. Bacon fat. Tropical oils, such as coconut, palm kernel, or palm oil. Seasoning and other foods Salted popcorn and pretzels. Onion salt, garlic salt, seasoned salt, table salt, and sea salt. Worcestershire sauce. Tartar sauce. Barbecue sauce. Teriyaki sauce. Soy sauce, including reduced-sodium. Steak sauce. Canned and packaged gravies. Fish sauce. Oyster sauce.  Cocktail sauce. Horseradish that you find on the shelf. Ketchup. Mustard. Meat flavorings and tenderizers. Bouillon cubes. Hot sauce and Tabasco sauce. Premade or packaged marinades. Premade or packaged taco seasonings. Relishes. Regular salad dressings. Where to find more information:  National Heart, Lung, and Blood Institute: PopSteam.is  American Heart Association: www.heart.org Summary  The DASH eating plan is a healthy eating plan that has been shown to reduce high blood pressure (hypertension). It may also reduce your risk for  type 2 diabetes, heart disease, and stroke.  With the DASH eating plan, you should limit salt (sodium) intake to 2,300 mg a day. If you have hypertension, you may need to reduce your sodium intake to 1,500 mg a day.  When on the DASH eating plan, aim to eat more fresh fruits and vegetables, whole grains, lean proteins, low-fat dairy, and heart-healthy fats.  Work with your health care provider or diet and nutrition specialist (dietitian) to adjust your eating plan to your individual calorie needs. This information is not intended to replace advice given to you by your health care provider. Make sure you discuss any questions you have with your health care provider. Document Released: 08/14/2011 Document Revised: 08/18/2016 Document Reviewed: 08/18/2016 Elsevier Interactive Patient Education  2019 [employer].

## 2018-11-18 ENCOUNTER — Encounter: Payer: Self-pay | Admitting: Rehabilitative and Restorative Service Providers"

## 2018-11-18 ENCOUNTER — Other Ambulatory Visit: Payer: Self-pay

## 2018-11-18 ENCOUNTER — Ambulatory Visit: Payer: Medicare HMO | Admitting: Rehabilitative and Restorative Service Providers"

## 2018-11-18 DIAGNOSIS — M25552 Pain in left hip: Secondary | ICD-10-CM

## 2018-11-18 DIAGNOSIS — R29898 Other symptoms and signs involving the musculoskeletal system: Secondary | ICD-10-CM

## 2018-11-18 DIAGNOSIS — R2689 Other abnormalities of gait and mobility: Secondary | ICD-10-CM

## 2018-11-18 DIAGNOSIS — M25551 Pain in right hip: Secondary | ICD-10-CM | POA: Diagnosis not present

## 2018-11-18 NOTE — Patient Instructions (Signed)

## 2018-11-18 NOTE — Therapy (Signed)
[facility] [address] [place], [place], [address] Phone: [phone]   Fax:  [phone]  Physical Therapy Evaluation  Patient Details  Name: Jennifer [name] MRN: [id number] Date of Birth: 10-15-1945 Referring Provider (PT): Dr Denyse Amass    Encounter Date: 11/18/2018  PT End of Session - 11/18/18 1246    Visit Number  1    Number of Visits  12    Date for PT Re-Evaluation  12/30/18    PT Start Time  1145    PT Stop Time  1248    PT Time Calculation (min)  63 min    Activity Tolerance  Patient tolerated treatment well       Past Medical History:  Diagnosis Date  . COPD (chronic obstructive pulmonary disease) (HCC) 11/03/2018  . Familial hypercholanemia 11/03/2018  . HLD (hyperlipidemia) 11/03/2018  . HTN (hypertension) 11/03/2018  . Hypothyroid 11/03/2018  . Vitamin D deficiency 11/03/2018  . Von Willebrand disease (HCC) possible 11/03/2018   Seen in documentation from prior provider    Past Surgical History:  Procedure Laterality Date  . APPENDECTOMY  1978  . BIOPSY ENDOMETRIAL    . CESAREAN SECTION     triplets  . TUBAL LIGATION      There were no vitals filed for this visit.   Subjective Assessment - 11/18/18 1152    Subjective  Gradual onset of bilat hip pain over the past 6 months with improvement in the past three wekks with medications.     Pertinent History  asthma; copd; HTN    Patient Stated Goals  get off the cane; get in and out of the bed more easily; get up and down easily     Currently in Pain?  Yes    Pain Score  3     Pain Location  Hip    Pain Orientation  Left    Pain Descriptors / Indicators  Dull;Burning;Aching    Pain Type  Chronic pain    Pain Radiating Towards  buring down the front of the leg     Pain Onset  More than a month ago    Pain Frequency  Intermittent    Aggravating Factors   sitting on bar stool; sit to standing; getting in and out of the bed     Pain Relieving Factors   sitting     Pain Score  2    Pain Location  Hip    Pain Orientation  Right    Pain Descriptors / Indicators  Aching;Burning;Dull    Pain Type  Chronic pain    Pain Radiating Towards  burning down the front of the thigh     Aggravating Factors   see above     Pain Relieving Factors  see above          [facility] PT Assessment - 11/18/18 0001      Assessment   Medical Diagnosis  Bilat hip pain Lt > Rt     Referring Provider (PT)  Dr Denyse Amass     Onset Date/Surgical Date  06/08/18    Hand Dominance  Right    Next MD Visit  12/01/2018    Prior Therapy  none       Precautions   Precautions  None      Restrictions   Weight Bearing Restrictions  No      Balance Screen   Has the patient fallen in the past 6 months  No    Has  the patient had a decrease in activity level because of a fear of falling?   No    Is the patient reluctant to leave their home because of a fear of falling?   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 worked until ~ 1 yr ago     Leisure  household chores; some stretching       Observation/Other Assessments   Focus on Therapeutic Outcomes (FOTO)   63% limitation       Sensation   Additional Comments  WFL's per pt report       Posture/Postural Control   Posture Comments  head forward; shoulders rounded; flexed forward at hips       AROM   Right/Left Hip  --   limited ROM throughout Lt > Rt    Right Hip Extension  -10    Left Hip Extension  -10    Right/Left Knee  --   WFL's bilat    Right/Left Ankle  --   WFL's bilat      Strength   Overall Strength Comments  moves LE's well against gravity - not tested resistively due to pain and pt limitations       Flexibility   Hamstrings  tight   Rt ~ 80 deg; Lt 75 deg      ITB  tight bilat Lt > Rt     Piriformis  extremely tight Lt > Rt       Palpation   Palpation comment  muscular tightness Lt > Rt hips anterior/lateral hips       Special Tests   Other special tests  pain and limitation with FABER and FADIR Lt > Rt       Transfers   Comments  difficulty with sit to stand - requiring time to reach extended position; difficulty with sit to supine pt sitting straight back lifting LE with hand       Ambulation/Gait   Ambulation/Gait  Yes    Ambulation/Gait Assistance  5: Supervision    Ambulation Distance (Feet)  40 Feet    Assistive device  Small based quad cane    Gait Pattern  Step-to pattern;Decreased step length - left    Ambulation Surface  Level    Gait Comments  ambulates with flip-flops and cane today                 Objective measurements completed on examination: See above findings.      [facility] Adult PT Treatment/Exercise - 11/18/18 0001      Knee/Hip Exercises: Stretches   Hip Flexor Stretch  Right;Left;3 reps;30 seconds   seated      Moist Heat Therapy   Number Minutes Moist Heat  15 Minutes    Moist Heat Location  Hip   anterior hips bilat      Electrical Stimulation   Electrical Stimulation Location  anterior hip to proximal quad     Electrical Stimulation Action  pre-mod    Electrical Stimulation Parameters  to tolerance    Electrical Stimulation Goals  Pain;Tone  PT Education - 11/18/18 1225    Education Details  HEP TENS - recommended patient avoid open toe shoes or flip flops     Person(s) Educated  Patient    Methods  Explanation;Demonstration;Tactile cues;Verbal cues;Handout    Comprehension  Verbalized understanding;Returned demonstration;Verbal cues required;Tactile cues required          PT Long Term Goals - 11/18/18 1259      PT LONG TERM GOAL #1   Title  Improve standing posture and alignment with patient to demonstrate neutral hip extension bilat  12/29/17    Time  6    Period  Weeks    Status  New      PT LONG TERM GOAL #2   Title  Patient demonstrate improved gait pattern with least assistive device and safe gait pattern and minimal to no pain 12/30/2018    Time  6    Period  Weeks    Status  New      PT LONG TERM GOAL #3   Title  Independent in all transfers with good body mechanics 12/30/2018    Time  6    Period  Weeks    Status  New      PT LONG TERM GOAL #4   Title  Independent in HEP 12/30/2018    Time  6    Status  New      PT LONG TERM GOAL #5   Title  Improve FOTO to </= 37% limitation 12/30/2018    Time  6    Period  Weeks    Status  New             Plan - 11/18/18 1246    Clinical Impression Statement  Patient presents with ~ 6 month history of bilat hip pain Lt > Rt. She reports increase uric acid levels which may have contributed to the hip pain. Uric acid levels are now normal and patient has been taking medication from Dr Denyse Amass with some improvement with meds. Patient has poor posture and alignment; limited hip ROM; tenderness to palpation bilat hips; limited functional activities including transfers; abnormal gait pattern; limited endurance. She will benefit from PT to address problems identified.     Examination-Activity Limitations  Squat;Bed Mobility;Stand;Locomotion Level    Examination-Participation Restrictions  Community Activity    Clinical Decision Making  Low    Rehab Potential  Good    PT Frequency  2x / week    PT Duration  6 weeks    PT Treatment/Interventions  Patient/family education;ADLs/Self Care Home Management;Cryotherapy;Electrical Stimulation;Iontophoresis /ml Dexamethasone;Moist Heat;Ultrasound;Therapeutic activities;Therapeutic exercise;Balance training;Neuromuscular re-education;Gait training;Manual techniques;Dry needling    PT Next Visit Plan  review HEP; progress with stretching and ROM; trial of manual traction through LE's; build the pump; ROM; gait; manual work and  modalities as indicated     Financial planner with Plan of Care  Patient       Patient will benefit from skilled therapeutic intervention in order to improve the following deficits and impairments:  Abnormal gait, Improper body mechanics, Postural dysfunction, Pain, Increased fascial restricitons, Increased muscle spasms, Hypomobility, Decreased mobility, Decreased range of motion, Decreased activity tolerance, Decreased endurance  Visit Diagnosis: Pain in left hip - Plan: PT plan of care cert/re-cert  Pain in right hip - Plan: PT plan of care cert/re-cert  Other symptoms and signs involving the musculoskeletal system - Plan: PT plan of care cert/re-cert  Other abnormalities of gait and mobility - Plan: PT plan of  care cert/re-cert     Problem List Patient Active Problem List   Diagnosis Date Noted  . COPD (chronic obstructive pulmonary disease) (HCC) 11/03/2018  . HTN (hypertension) 11/03/2018  . Familial hypercholanemia 11/03/2018  . HLD (hyperlipidemia) 11/03/2018  . Vitamin D deficiency 11/03/2018  . Hypothyroid 11/03/2018  . Von Willebrand disease (HCC) possible 11/03/2018  . Elevated uric acid in blood 11/03/2018  . Centrilobular emphysema (HCC) 11/03/2018  . Hypoxia 11/03/2018  . Elevated troponin 09/18/2016    Saide Lanuza Rober Minion PT, MPH  11/18/2018, 1:05 PM  [facility] [address] [place], [place], [address] Phone: [phone]   Fax:  [phone]  Name: Jennifer [name] MRN: [id number] Date of Birth: 1946/06/05

## 2018-11-22 ENCOUNTER — Encounter: Payer: Medicare HMO | Admitting: Physical Therapy

## 2018-11-23 ENCOUNTER — Encounter: Payer: Self-pay | Admitting: Physician Assistant

## 2018-11-23 DIAGNOSIS — Z9109 Other allergy status, other than to drugs and biological substances: Secondary | ICD-10-CM | POA: Insufficient documentation

## 2018-11-23 DIAGNOSIS — Z9981 Dependence on supplemental oxygen: Secondary | ICD-10-CM | POA: Insufficient documentation

## 2018-11-23 DIAGNOSIS — I10 Essential (primary) hypertension: Secondary | ICD-10-CM | POA: Insufficient documentation

## 2018-11-24 ENCOUNTER — Encounter: Payer: Medicare HMO | Admitting: Physical Therapy

## 2018-11-26 ENCOUNTER — Telehealth (INDEPENDENT_AMBULATORY_CARE_PROVIDER_SITE_OTHER): Payer: Medicare HMO | Admitting: Physician Assistant

## 2018-11-26 ENCOUNTER — Telehealth: Payer: Self-pay | Admitting: Rehabilitative and Restorative Service Providers"

## 2018-11-26 DIAGNOSIS — M25552 Pain in left hip: Secondary | ICD-10-CM | POA: Diagnosis not present

## 2018-11-26 DIAGNOSIS — M25551 Pain in right hip: Secondary | ICD-10-CM | POA: Diagnosis not present

## 2018-11-26 NOTE — Telephone Encounter (Signed)
Contacted Cher to inform her that all [facility] outpatient rehab clinics will be closed until December 13, 2018 due to COVID19 virus. We will followup with patient's HEP and answer questions in the next two weeks by phone and/or email. We are working on evisits if patient is interested in this type visit. Thank you for your referral. Please let us know if we can be of further assistance. Thank you, Kaylah Chiasson P. Leonor Liv PT, MPH 11/26/18 12:13 PM

## 2018-11-26 NOTE — Telephone Encounter (Signed)
Patient was calling in stating that she only has 3 days left of exercise per her Physical therapist. States that she is really tight. Her appointments have been moved to April 7th due to physical therapy closing their office. Would like to know if she can get muscle relaxers called in to see if this will help or she wanted to know what Dr.Corey thought. IF she can get it called it, please call in to [facility] [phone] - [place], [place] - [address]. Please advise.

## 2018-11-29 MED ORDER — CYCLOBENZAPRINE HCL 5 MG PO TABS: 5.0000 mg | ORAL_TABLET | Freq: Three times a day (TID) | ORAL | 0 refills | Status: DC | PRN

## 2018-11-29 NOTE — Telephone Encounter (Signed)
Advised patient of information below. Patient states that she will be very careful and if she has any symptoms she will call us immediately. Patient only have about 4 pills left of the colchicine 0.6 MG tablet [[id number]]. Would like to know if she needs another refill on this and if she needed to even come in on 12/01/2018 to see Dr.Corey or if she can push the appointment out. Please advise.

## 2018-11-29 NOTE — Telephone Encounter (Signed)
Called patient and notified of MD instructions. Asked to call back with what she would like to do. KG LPN

## 2018-11-29 NOTE — Telephone Encounter (Signed)
We will send in low-dose Flexeril for muscle relaxer.  I am not confident this will help very much.  I called and left a message to warn patient against taking it with Xanax which she does not take frequently.  Warned additionally about fatigue confusion and increased risk of falls. Recheck as needed.  Total time 5 mins.

## 2018-11-29 NOTE — Telephone Encounter (Signed)
If patient thinks that the colchicine helped I will refill it if she does not think it helps then there is no point keeping taking it.

## 2018-11-30 ENCOUNTER — Telehealth: Payer: Self-pay | Admitting: Physician Assistant

## 2018-11-30 ENCOUNTER — Encounter: Payer: Medicare HMO | Admitting: Rehabilitative and Restorative Service Providers"

## 2018-11-30 NOTE — Telephone Encounter (Signed)
Pt called and left message to see if Dr.Corey wanted pt to come in for her appt. Wednesday. I called patient and advised I would check with her provider to see if telephone visit would be appropriate.

## 2018-11-30 NOTE — Telephone Encounter (Signed)
Spoke with patient again, and patient decided to try the muscle relaxer first and give it some more time. She cancelled her Wednesday appt. And will call if there is anything she needs.

## 2018-12-01 ENCOUNTER — Ambulatory Visit: Payer: Medicare HMO | Admitting: Family Medicine

## 2018-12-02 ENCOUNTER — Encounter: Payer: Medicare HMO | Admitting: Rehabilitative and Restorative Service Providers"

## 2018-12-02 ENCOUNTER — Telehealth: Payer: Self-pay | Admitting: Rehabilitative and Restorative Service Providers"

## 2018-12-02 NOTE — Telephone Encounter (Signed)
Spoke with patient re- status and progress with rehab for hip pain. Discussed exercise program and encouraged patient to stand and walk frequently to work on hip extension. Patient will call with questions. She does not want to schedule at this time but would like to continue rehab as covid19 virus precautions allow. Tanvir Hipple P. Leonor Liv PT, MPH 12/02/18 11:10 AM

## 2018-12-07 ENCOUNTER — Encounter: Payer: Medicare HMO | Admitting: Physical Therapy

## 2018-12-09 ENCOUNTER — Encounter: Payer: Medicare HMO | Admitting: Rehabilitative and Restorative Service Providers"

## 2018-12-14 ENCOUNTER — Encounter: Payer: Medicare HMO | Admitting: Rehabilitative and Restorative Service Providers"

## 2018-12-14 ENCOUNTER — Telehealth: Payer: Self-pay | Admitting: Rehabilitative and Restorative Service Providers"

## 2018-12-14 NOTE — Telephone Encounter (Signed)
TC to patient to check on progress. She has started using a muscle relaxer which has helped with the muscle pain. She continues to have arthritic pain. She is working on her exercises. Offered suggestions for continued exercise - patient will start a walking program 2-5 min 1-2 times/day gradually increasing time to tolerance working toward 15-20 min. Level surfaces. Patient does want to continue PT when safe for her to get out. PT will call to check on patient in ~ 1 week.  Val Riles, PT, MPH  Acute Rehab [phone]

## 2018-12-16 ENCOUNTER — Encounter: Payer: Medicare HMO | Admitting: Physical Therapy

## 2018-12-21 ENCOUNTER — Encounter: Payer: Medicare HMO | Admitting: Physical Therapy

## 2018-12-22 ENCOUNTER — Telehealth: Payer: Self-pay | Admitting: Rehabilitative and Restorative Service Providers"

## 2018-12-22 NOTE — Telephone Encounter (Signed)
TC to patient to discuss status.She reports that she has continued severe pain in the hip and at times into the anterior thigh. She states that she plans to call Dr Denyse Amass and discuss the continued pain. She did not want to schedule PT visit due to her concerns about COVID19. We will be happy to schedule return visits as indicated and [facility] will call to schedule if she decides to return to PT after her call with Dr Denyse Amass.   Jarae Panas P. Leonor Liv PT, MPH 12/22/18 9:26 AM

## 2018-12-23 ENCOUNTER — Encounter: Payer: Medicare HMO | Admitting: Physical Therapy

## 2018-12-28 ENCOUNTER — Encounter: Payer: Medicare HMO | Admitting: Physical Therapy

## 2018-12-30 ENCOUNTER — Encounter: Payer: Medicare HMO | Admitting: Physical Therapy

## 2019-01-03 ENCOUNTER — Encounter: Payer: Self-pay | Admitting: Family Medicine

## 2019-01-03 ENCOUNTER — Ambulatory Visit (INDEPENDENT_AMBULATORY_CARE_PROVIDER_SITE_OTHER): Payer: Medicare HMO | Admitting: Family Medicine

## 2019-01-03 VITALS — BP 101/74 | HR 74 | Temp 97.3°F | Wt 193.0 lb

## 2019-01-03 DIAGNOSIS — M25552 Pain in left hip: Secondary | ICD-10-CM | POA: Diagnosis not present

## 2019-01-03 DIAGNOSIS — M25551 Pain in right hip: Secondary | ICD-10-CM | POA: Diagnosis not present

## 2019-01-03 MED ORDER — CELECOXIB 100 MG PO CAPS: 100.0000 mg | ORAL_CAPSULE | Freq: Two times a day (BID) | ORAL | 3 refills | Status: DC | PRN

## 2019-01-03 NOTE — Patient Instructions (Addendum)
Thank you for coming in today. STOP aleve Start celebrex. Ok to take 1 pill twice daily.  Ok to take with tylenol arthritis.   Check back in about 3 months. We will want to recheck kidney function then.   Let me know if things change.   Celecoxib capsules What is this medicine? CELECOXIB (sell a KOX ib) is a non-steroidal anti-inflammatory drug (NSAID). This medicine is used to treat arthritis and ankylosing spondylitis. It may be also used for pain or painful monthly periods. This medicine may be used for other purposes; ask your health care provider or pharmacist if you have questions. COMMON BRAND NAME(S): Celebrex What should I tell my health care provider before I take this medicine? They need to know if you have any of these conditions: -asthma -coronary artery bypass graft (CABG) surgery within the past 2 weeks -drink more than 3 alcohol-containing drinks a day -heart disease or circulation problems like heart failure or leg edema (fluid retention) -high blood pressure -kidney disease -liver disease -stomach bleeding or ulcers -an unusual or allergic reaction to celecoxib, sulfa drugs, aspirin, other NSAIDs, other medicines, foods, dyes, or preservatives -pregnant or trying to get pregnant -breast-feeding How should I use this medicine? Take this medicine by mouth with a full glass of water. Follow the directions on the prescription label. Take it with food if it upsets your stomach or if you take 400 mg at one time. Try to not lie down for at least 10 minutes after you take the medicine. Take the medicine at the same time each day. Do not take more medicine than you are told to take. Long-term, continuous use may increase the risk of heart attack or stroke. A special MedGuide will be given to you by the pharmacist with each prescription and refill. Be sure to read this information carefully each time. Talk to your pediatrician regarding the use of this medicine in children.  Special care may be needed. Overdosage: If you think you have taken too much of this medicine contact a poison control center or emergency room at once. NOTE: This medicine is only for you. Do not share this medicine with others. What if I miss a dose? If you miss a dose, take it as soon as you can. If it is almost time for your next dose, take only that dose. Do not take double or extra doses. What may interact with this medicine? Do not take this medicine with any of the following medications: -cidofovir -methotrexate -other NSAIDs, medicines for pain and inflammation, like ibuprofen or naproxen -pemetrexed This medicine may also interact with the following medications: -alcohol -aspirin and aspirin-like drugs -diuretics -fluconazole -lithium -medicines for high blood pressure -steroid medicines like prednisone or cortisone -warfarin This list may not describe all possible interactions. Give your health care provider a list of all the medicines, herbs, non-prescription drugs, or dietary supplements you use. Also tell them if you smoke, drink alcohol, or use illegal drugs. Some items may interact with your medicine. What should I watch for while using this medicine? Tell your doctor or health care professional if your pain does not get better. Talk to your doctor before taking another medicine for pain. Do not treat yourself. This medicine does not prevent heart attack or stroke. In fact, this medicine may increase the chance of a heart attack or stroke. The chance may increase with longer use of this medicine and in people who have heart disease. If you take aspirin to prevent heart  attack or stroke, talk with your doctor or health care professional. Do not take medicines such as ibuprofen and naproxen with this medicine. Side effects such as stomach upset, nausea, or ulcers may be more likely to occur. Many medicines available without a prescription should not be taken with this  medicine. This medicine can cause ulcers and bleeding in the stomach and intestines at any time during treatment. Ulcers and bleeding can happen without warning symptoms and can cause death. What side effects may I notice from receiving this medicine? Side effects that you should report to your doctor or health care professional as soon as possible: -allergic reactions like skin rash, itching or hives, swelling of the face, lips, or tongue -black or bloody stools, blood in the urine or vomit -blurred vision -breathing problems -chest pain -nausea, vomiting -problems with balance, talking, walking -redness, blistering, peeling or loosening of the skin, including inside the mouth -unexplained weight gain or swelling -unusually weak or tired -yellowing of eyes, skin Side effects that usually do not require medical attention (report to your doctor or health care professional if they continue or are bothersome): -constipation or diarrhea -dizziness -gas or heartburn -upset stomach This list may not describe all possible side effects. Call your doctor for medical advice about side effects. You may report side effects to FDA at 1-800-FDA-1088. Where should I keep my medicine? Keep out of the reach of children. Store at room temperature between 15 and 30 degrees C (59 and 86 degrees F). Keep container tightly closed. Throw away any unused medicine after the expiration date. NOTE: This sheet is a summary. It may not cover all possible information. If you have questions about this medicine, talk to your doctor, pharmacist, or health care provider.  2019 Elsevier/Gold Standard (2009-10-24 10:54:17)

## 2019-01-03 NOTE — Progress Notes (Signed)
Virtual Visit  via Video Note  I connected with      Jennifer [name] by a video enabled telemedicine application and verified that I am speaking with the correct person using two identifiers.   I discussed the limitations of evaluation and management by telemedicine and the availability of in person appointments. The patient expressed understanding and agreed to proceed.  History of Present Illness: Jennifer [name] is a 73 y.o. female who would like to discuss hip pain.   Jennifer [name] originally saw me in February for hip pain.  At that time her PIP pain was thought to be mostly due to DJD seen on x-ray but also tendinitis and muscle dysfunction.  She is had some physical therapy with moderate to mild benefit.  She continues to have some pain and would like other options of possible if she would like to delay or put off hip replacement into the future.  She notes that she has had some benefit with naproxen but is worried about developing a stomach ulcer and is interested in trying to switch to Celebrex.  She also notes that prescription of cyclobenzaprine that she takes at bedtime has been somewhat helpful as well and she like to continue that additionally.   Observations/Objective: BP 101/74   Pulse 74   Temp (!) 97.3 F (36.3 C) (Oral)   Wt 193 lb (87.5 kg)   SpO2 96%   BMI 31.15 kg/m  Wt Readings from Last 5 Encounters:  01/03/19 193 lb (87.5 kg)  11/12/18 198 lb (89.8 kg)  11/03/18 199 lb (90.3 kg)   Exam: Appearance nontoxic no acute distress Normal Speech.    Lab and Radiology Results Dg Hips Bilat With Pelvis 3-4 Views  Result Date: 11/03/2018 CLINICAL DATA:  Bilateral hip and groin pain. EXAM: DG HIP (WITH OR WITHOUT PELVIS) 3-4V BILAT COMPARISON:  None. FINDINGS: Hips are located. No evidence of pelvic fracture or sacral fracture. Dedicated view of the LEFT and RIGHT hips demonstrates no femoral neck fracture. There is joint space narrowing superiorly in the LEFT andRIGHT  hip joint. Mild sclerosis of the femoral heads and mild remodeling. IMPRESSION: 1. No pelvic fracture. 2. No femoral neck fracture. 3. Bilateral moderate osteoarthritis of the hip joints with joint space narrowing and mild remodeling of the femoral heads. Electronically Signed   By: Genevive BiStewart  Edmunds M.D.   On: 11/03/2018 17:06   I personally (independently) visualized and performed the interpretation of the images attached in this note.   Assessment and Plan: 73 y.o. female with hip pain: Due to DJD hips bilaterally as well as some tendinitis.  Plan to continue home exercise program and remote physical therapy.  We will switch from naproxen to Celebrex as that may help a little bit better.  Consider recheck kidney function in about 3 months.  Return sooner if needed.  PDMP reviewed during this encounter. No orders of the defined types were placed in this encounter.  Meds ordered this encounter  Medications  . celecoxib (CELEBREX) 100 MG capsule    Sig: Take 1 capsule (100 mg total) by mouth 2 (two) times daily as needed for moderate pain.    Dispense:  60 capsule    Refill:  3    Follow Up Instructions:    I discussed the assessment and treatment plan with the patient. The patient was provided an opportunity to ask questions and all were answered. The patient agreed with the plan and demonstrated an understanding of the instructions.  The patient was advised to call back or seek an in-person evaluation if the symptoms worsen or if the condition fails to improve as anticipated.  Time: 15 minutes of intraservice time, with >22 minutes of total time during today's visit.      Historical information moved to improve visibility of documentation.  Past Medical History:  Diagnosis Date  . COPD (chronic obstructive pulmonary disease) (HCC) 11/03/2018  . Familial hypercholanemia 11/03/2018  . HLD (hyperlipidemia) 11/03/2018  . HTN (hypertension) 11/03/2018  . Hypothyroid 11/03/2018  .  Vitamin D deficiency 11/03/2018  . Von Willebrand disease (HCC) possible 11/03/2018   Seen in documentation from prior provider   Past Surgical History:  Procedure Laterality Date  . APPENDECTOMY  1978  . BIOPSY ENDOMETRIAL    . CESAREAN SECTION     triplets  . TUBAL LIGATION     Social History   Tobacco Use  . Smoking status: Former Smoker    Packs/day: 0.50    Years: 15.00    Pack years: 7.50    Types: Cigarettes    Last attempt to quit: 07/22/2014    Years since quitting: 4.4  . Smokeless tobacco: Never Used  Substance Use Topics  . Alcohol use: Not on file   family history includes Asthma in her brother; Cancer in her father; Parkinson's disease in her mother.  Medications: Current Outpatient Medications  Medication Sig Dispense Refill  . acetaminophen (TYLENOL) 650 MG CR tablet Take 650 mg by mouth every 8 (eight) hours as needed.    Marland Kitchen albuterol (PROVENTIL HFA;VENTOLIN HFA) 108 (90 Base) MCG/ACT inhaler Inhale 1-2 puffs into the lungs every 4 (four) hours as needed for wheezing or shortness of breath. 1 Inhaler 5  . ALPRAZolam (XANAX) 0.5 MG tablet Take by mouth.    . colchicine 0.6 MG tablet Take 1 tablet (0.6 mg total) by mouth daily. 30 tablet 1  . cyclobenzaprine (FLEXERIL) 5 MG tablet Take 1 tablet (5 mg total) by mouth 3 (three) times daily as needed for muscle spasms. 30 tablet 0  . lisinopril-hydrochlorothiazide (PRINZIDE,ZESTORETIC) 20-25 MG tablet Take 1 tablet by mouth daily. 90 tablet 1  . montelukast (SINGULAIR) 10 MG tablet Take 1 tablet (10 mg total) by mouth at bedtime. 90 tablet 2  . SYMBICORT 160-4.5 MCG/ACT inhaler Inhale 2 puffs into the lungs 2 (two) times daily. 1 Inhaler 6  . celecoxib (CELEBREX) 100 MG capsule Take 1 capsule (100 mg total) by mouth 2 (two) times daily as needed for moderate pain. 60 capsule 3   No current facility-administered medications for this visit.    Allergies  Allergen Reactions  . Prednisone Other (See Comments)     Other reaction(s): psychological reaction, hallucinations  . Statins Other (See Comments)    Other reaction(s): muscle/joint pain Weakness and tiredness

## 2019-01-13 ENCOUNTER — Telehealth: Payer: Self-pay | Admitting: Rehabilitative and Restorative Service Providers"

## 2019-01-13 NOTE — Telephone Encounter (Signed)
TC to patient to check on her status. She reports that Dr Denyse Amass started her on celebrex which has helped some. Patient is interested in returning to PT in the next few weeks. She will call to schedule.   Ari Bernabei P. Leonor Liv PT, MPH 01/13/19 12:52 PM

## 2019-02-08 ENCOUNTER — Ambulatory Visit (INDEPENDENT_AMBULATORY_CARE_PROVIDER_SITE_OTHER): Payer: Medicare HMO | Admitting: Rehabilitative and Restorative Service Providers"

## 2019-02-08 ENCOUNTER — Encounter: Payer: Self-pay | Admitting: Rehabilitative and Restorative Service Providers"

## 2019-02-08 ENCOUNTER — Other Ambulatory Visit: Payer: Self-pay

## 2019-02-08 DIAGNOSIS — M25552 Pain in left hip: Secondary | ICD-10-CM

## 2019-02-08 DIAGNOSIS — R2689 Other abnormalities of gait and mobility: Secondary | ICD-10-CM

## 2019-02-08 DIAGNOSIS — R29898 Other symptoms and signs involving the musculoskeletal system: Secondary | ICD-10-CM | POA: Diagnosis not present

## 2019-02-08 DIAGNOSIS — M25551 Pain in right hip: Secondary | ICD-10-CM

## 2019-02-08 NOTE — Patient Instructions (Signed)
Trigger Point Dry Needling  . What is Trigger Point Dry Needling (DN)? o DN is a physical therapy technique used to treat muscle pain and dysfunction. Specifically, DN helps deactivate muscle trigger points (muscle knots).  o A thin filiform needle is used to penetrate the skin and stimulate the underlying trigger point. The goal is for a local twitch response (LTR) to occur and for the trigger point to relax. No medication of any kind is injected during the procedure.   . What Does Trigger Point Dry Needling Feel Like?  o The procedure feels different for each individual patient. Some patients report that they do not actually feel the needle enter the skin and overall the process is not painful. Very mild bleeding may occur. However, many patients feel a deep cramping in the muscle in which the needle was inserted. This is the local twitch response.   Marland Kitchen How Will I feel after the treatment? o Soreness is normal, and the onset of soreness may not occur for a few hours. Typically this soreness does not last longer than two days.  o Bruising is uncommon, however; ice can be used to decrease any possible bruising.  o In rare cases feeling tired or nauseous after the treatment is normal. In addition, your symptoms may get worse before they get better, this period will typically not last longer than 24 hours.   . What Can I do After My Treatment? o Increase your hydration by drinking more water for the next 24 hours. o You may place ice or heat on the areas treated that have become sore, however, do not use heat on inflamed or bruised areas. Heat often brings more relief post needling. o You can continue your regular activities, but vigorous activity is not recommended initially after the treatment for 24 hours. o DN is best combined with other physical therapy such as strengthening, stretching, and other therapies.  Access Code: [email or web address]  URL: [email or web address]  Date: 02/08/2019   Prepared by: Mayer Camel   Exercises  Supine Bridge - 5 reps - 3 sets - 1x daily - 7x weekly  Supine Quad Set - 5 reps - 2 sets - 5-10 hold - 1x daily - 7x weekly  Hooklying Clamshell with Resistance - 5 reps - 3 sets - 1x daily - 7x weekly

## 2019-02-08 NOTE — Therapy (Signed)
[facility] [address] [place], [place], [address] Phone: [phone]   Fax:  [phone]  Physical Therapy Treatment  Patient Details  Name: Jennifer [name] MRN: [id number] Date of Birth: Feb 02, 1946 Referring Provider (PT): Dr Denyse Amass    Encounter Date: 02/08/2019  PT End of Session - 02/08/19 1105    Visit Number  2    Number of Visits  12    Date for PT Re-Evaluation  03/22/19    PT Start Time  1105    PT Stop Time  1205   Simultaneous filing. User may not have seen previous data.   PT Time Calculation (min)  60 min   Simultaneous filing. User may not have seen previous data.   Activity Tolerance  Patient tolerated treatment well       Past Medical History:  Diagnosis Date  . COPD (chronic obstructive pulmonary disease) (HCC) 11/03/2018  . Familial hypercholanemia 11/03/2018  . HLD (hyperlipidemia) 11/03/2018  . HTN (hypertension) 11/03/2018  . Hypothyroid 11/03/2018  . Vitamin D deficiency 11/03/2018  . Von Willebrand disease (HCC) possible 11/03/2018   Seen in documentation from prior provider    Past Surgical History:  Procedure Laterality Date  . APPENDECTOMY  1978  . BIOPSY ENDOMETRIAL    . CESAREAN SECTION     triplets  . TUBAL LIGATION      There were no vitals filed for this visit.  Subjective Assessment - 02/08/19 1112    Subjective  Continued pain in th bilat hips Lt > Rt. She has added celebrex with some improvement. She is now moving without as much pain. She has notices some swelling in her thighs for knees which she feels is related to the celebrex. She does not feel confident with walking without her cane. She feels weak in her hips. She knows she needs to stretch but it hurts too much. She does not want to stretch in the clinic. Wants to work on strength.     Currently in Pain?  Yes    Pain Score  2     Pain Location  Hip    Pain Orientation  Left    Pain Descriptors / Indicators   Dull;Aching;Burning    Pain Type  Chronic pain    Pain Radiating Towards  no radiating pain     Pain Onset  More than a month ago    Pain Frequency  Intermittent    Aggravating Factors   worse in the mornings - sleeping in the recliner     Pain Relieving Factors  meds     Multiple Pain Sites  No    Pain Score  0    Pain Location  Hip    Pain Orientation  Right         [facility] PT Assessment - 02/08/19 0001      Assessment   Medical Diagnosis  Bilat hip pain Lt > Rt     Referring Provider (PT)  Dr Denyse Amass     Onset Date/Surgical Date  06/08/18    Hand Dominance  Right    Next MD Visit  12/01/2018    Prior Therapy  none       Observation/Other Assessments   Focus on Therapeutic Outcomes (FOTO)   67% limitation       Sensation   Additional Comments  WFL's per pt report       AROM   Right/Left Hip  --   limited ROM bilat Lt >  Rt hip    Right Hip Extension  --   ~ (-) 10 degrees hip extension at most    Left Hip Extension  --   ~(-)15 hip extensin at most    Right/Left Knee  --   WFL's bilat    Right/Left Ankle  --   WFL's bilat      Strength   Overall Strength Comments  moves LE's to stand and transfer; ambulates with antalgic gait pattern     Right/Left Hip  --   pt unable to assume positions for MMT d/t pain/limited mvt    Right Hip Flexion  4-/5   painful - gives d/t pain    Left Hip Flexion  3+/5   painful - gives d/t pain    Right Knee Flexion  5/5    Right Knee Extension  5/5    Left Knee Flexion  5/5    Left Knee Extension  5/5      Flexibility   Hamstrings  tight bilat Lt > Rt     ITB  tight bilat Lt > Rt     Piriformis  extremely tight Lt > Rt       Palpation   Palpation comment  significant muscular tightness Lt > Rt hips anterior/lateral hips       Special Tests   Other special tests  pain and limitation with FABER and FADIR Lt > Rt       Transfers   Comments  difficulty with sit to stand - requiring time to reach extended position; difficulty with  sit to supine pt sitting straight back lifting LE with hand       Ambulation/Gait   Ambulation/Gait  Yes    Ambulation/Gait Assistance  5: Supervision    Ambulation Distance (Feet)  40 Feet    Assistive device  Small based quad cane    Gait Pattern  Step-to pattern;Decreased step length - left    Ambulation Surface  Level    Gait Comments  ambulates with wide based gait pattern; hips and knees flexed; LE's in ER Lt > Rt                    [facility] Adult PT Treatment/Exercise - 02/08/19 0001      Knee/Hip Exercises: Supine   Quad Sets  Strengthening;Right;Left;1 set;5 reps   5 sec holds, with glute set and DF   Bridges  2 sets;5 reps    Other Supine Knee/Hip Exercises  resisted clams with yellow band x 5 reps, 3 sets      Moist Heat Therapy   Number Minutes Moist Heat  12 Minutes    Moist Heat Location  Hip   anterior hips bilat             PT Education - 02/08/19 1256    Education Details  HEP POC     Person(s) Educated  Patient    Methods  Explanation;Demonstration;Tactile cues;Verbal cues;Handout    Comprehension  Verbalized understanding;Returned demonstration;Verbal cues required;Tactile cues required          PT Long Term Goals - 02/08/19 1148      PT LONG TERM GOAL #1   Title  Improve standing posture and alignment with patient to demonstrate neutral hip extension bilat 03/22/2019    Time  6    Period  Weeks    Status  Revised      PT LONG TERM GOAL #2   Title  Patient demonstrate improved  gait pattern with least assistive device and safe gait pattern and minimal to no pain 03/22/2019    Time  6    Period  Weeks    Status  Revised      PT LONG TERM GOAL #3   Title  Independent in all transfers with good body mechanics 03/22/2019    Time  6    Period  Weeks    Status  Revised      PT LONG TERM GOAL #4   Title  Independent in HEP 03/22/2019    Time  6    Period  Weeks    Status  Revised      PT LONG TERM GOAL #5   Title  Improve FOTO to  </= 37% limitation 03/22/2019    Time  6    Period  Weeks    Status  Revised            Plan - 02/08/19 1106    Clinical Impression Statement  Jennifer [name] returns after one previous PT appointment 11/2018. She has less pain with symptoms responding well to medication. Patient has increased hip flexor tightness with significant limitatins in all ranges of hip motion Lt > Rt. She has decreased strength functionally and per her report She is unable to achieve test positins for manual muscle testing. Jennifer [name] has antalgic gait pattern with narrow based quad cane Rt UE. She has poor posture and alignment and reports that she sleeps in her recliner which is easier to get out of than her bed. patient does not want to work on stretching her hip flexors even though she knows they need to be stretched. She states that the stretches hurt too much. Jennifer [name] wants to work on strengthening so she can walk with more confidence and walk without a cane. She will benefit from PT to address problems identified. We will try dry needling and manual work through hips to decrease tightness and gradually encourage patient to stretch as we work on Artist.     Examination-Activity Limitations  Squat;Bed Mobility;Stand;Locomotion Level    Examination-Participation Restrictions  Community Activity    Rehab Potential  Good    PT Frequency  2x / week    PT Duration  6 weeks    PT Treatment/Interventions  Patient/family education;ADLs/Self Care Home Management;Cryotherapy;Electrical Stimulation;Iontophoresis /ml Dexamethasone;Moist Heat;Ultrasound;Therapeutic activities;Therapeutic exercise;Balance training;Neuromuscular re-education;Gait training;Manual techniques;Dry needling    PT Next Visit Plan  review HEP; progress with stretching and ROM as patient allows/tolerates; strengthening as possible; trial of manual traction through LE's; ROM; gait; manual work and modalities as indicated; trial of DN     Consulted  and Agree with Plan of Care  Patient       Patient will benefit from skilled therapeutic intervention in order to improve the following deficits and impairments:  Abnormal gait, Improper body mechanics, Postural dysfunction, Pain, Increased fascial restricitons, Increased muscle spasms, Hypomobility, Decreased mobility, Decreased range of motion, Decreased activity tolerance, Decreased endurance  Visit Diagnosis: Pain in left hip - Plan: PT plan of care cert/re-cert  Pain in right hip - Plan: PT plan of care cert/re-cert  Other symptoms and signs involving the musculoskeletal system - Plan: PT plan of care cert/re-cert  Other abnormalities of gait and mobility - Plan: PT plan of care cert/re-cert     Problem List Patient Active Problem List   Diagnosis Date Noted  . On home oxygen therapy 11/23/2018  . Environmental allergies 11/23/2018  . White coat syndrome  with diagnosis of hypertension 11/23/2018  . COPD (chronic obstructive pulmonary disease) (HCC) 11/03/2018  . Hypertension goal BP (blood pressure) < 140/90 11/03/2018  . Familial hypercholanemia 11/03/2018  . HLD (hyperlipidemia) 11/03/2018  . Vitamin D deficiency 11/03/2018  . Hypothyroid 11/03/2018  . Von Willebrand disease (HCC) possible 11/03/2018  . Elevated uric acid in blood 11/03/2018  . Centrilobular emphysema (HCC) 11/03/2018  . Hypoxia 11/03/2018  . Elevated troponin 09/18/2016    Jennifer [name] Rober Minion PT, MPH  02/08/2019, 1:03 PM  [facility] [address] [place] [address] [place], [place], [address] Phone: [phone]   Fax:  [phone]  Name: Jennifer [name] MRN: [id number] Date of Birth: 05/19/1946

## 2019-02-10 ENCOUNTER — Ambulatory Visit (INDEPENDENT_AMBULATORY_CARE_PROVIDER_SITE_OTHER): Payer: Medicare HMO | Admitting: Physical Therapy

## 2019-02-10 ENCOUNTER — Other Ambulatory Visit: Payer: Self-pay

## 2019-02-10 DIAGNOSIS — R2689 Other abnormalities of gait and mobility: Secondary | ICD-10-CM

## 2019-02-10 DIAGNOSIS — M25551 Pain in right hip: Secondary | ICD-10-CM | POA: Diagnosis not present

## 2019-02-10 DIAGNOSIS — R29898 Other symptoms and signs involving the musculoskeletal system: Secondary | ICD-10-CM | POA: Diagnosis not present

## 2019-02-10 DIAGNOSIS — M25552 Pain in left hip: Secondary | ICD-10-CM | POA: Diagnosis not present

## 2019-02-10 NOTE — Therapy (Signed)
[facility] [address] [place], [place], [address] Phone: [phone]   Fax:  [phone]  Physical Therapy Treatment  Patient Details  Name: Jennifer KernsSheryl [name] MRN: [id number] Date of Birth: [birth year] Referring Provider (PT): Dr Denyse Amassorey    Encounter Date: 02/10/2019  PT End of Session - 02/10/19 1413    Visit Number  3    Number of Visits  12    Date for PT Re-Evaluation  03/22/19    PT Start Time  1407    PT Stop Time  1455    PT Time Calculation (min)  48 min    Behavior During Therapy  [facility] assessed/performed       Past Medical History:  Diagnosis Date  . COPD (chronic obstructive pulmonary disease) (HCC) 11/03/2018  . Familial hypercholanemia 11/03/2018  . HLD (hyperlipidemia) 11/03/2018  . HTN (hypertension) 11/03/2018  . Hypothyroid 11/03/2018  . Vitamin D deficiency 11/03/2018  . Von Willebrand disease (HCC) possible 11/03/2018   Seen in documentation from prior provider    Past Surgical History:  Procedure Laterality Date  . APPENDECTOMY  1978  . BIOPSY ENDOMETRIAL    . CESAREAN SECTION     triplets  . TUBAL LIGATION      There were no vitals filed for this visit.  Subjective Assessment - 02/10/19 1413    Subjective  Pt reports she brought her family with to drive her home, as she would like to have dry needling done today.  She did much better after last session than she did the time before.       Pertinent History  asthma; copd; HTN    Patient Stated Goals  get off the cane; get in and out of the bed more easily; get up and down easily     Currently in Pain?  Yes    Pain Score  3    pt took 3 tylenol prior to session.    Pain Location  Hip    Pain Orientation  Right;Left    Pain Descriptors / Indicators  Tightness;Aching         [facility] - 02/10/19 0001      Assessment   Medical Diagnosis  Bilat hip pain Lt > Rt     Referring Provider (PT)  Dr Denyse Amassorey     Onset  Date/Surgical Date  06/08/18    Hand Dominance  Right    Next MD Visit  not scheduled yet.     Prior Therapy  none                    [facility] Adult PT Treatment/Exercise - 02/10/19 0001      Knee/Hip Exercises: Standing   Gait Training  cues for posture and placement of cane (closer to body) x 50 ft (min improvement/ carry over)    Other Standing Knee Exercises  standing trunk ext with hands on counter (back facing counter) x 3 sec x 5 reps;  then pt performed her own hip ext / countertop push up x 5 reps       Knee/Hip Exercises: Supine   Quad Sets  Strengthening;Right;Left;1 set;5 reps   5 sec holds, with glute set and DF   Short Arc Quad Sets  Right;Left;1 set;5 reps   with initiation of SLR (very challenging on LLE)   Bridges  2 sets;5 reps    Bridges Limitations  initially cramped in BLE, after rest, was able to complete  sets.       Modalities   Modalities  Moist Heat;Electrical Stimulation      Moist Heat Therapy   Number Minutes Moist Heat  10 Minutes    Moist Heat Location  Hip   ant hips bilat     Electrical Stimulation   Electrical Stimulation Location  anterior hip to proximal quad     Electrical Stimulation Action  pre-mod to each area    Electrical Stimulation Parameters  intensity to tolerance     Electrical Stimulation Goals  Pain;Tone      Manual Therapy   Manual Therapy  Passive ROM;Soft tissue mobilization    Manual therapy comments  pt in supine     Soft tissue mobilization  Lt hip flexors and TFL    Passive ROM  Hip flexion (with knee flexion) into outward circumduction of LE x 5-8 reps each leg (very guarded)       Trigger Point Dry Needling - 02/10/19 0001    Consent Given?  Yes    Education Handout Provided  Yes    Muscles Treated Lower Quadrant  Rectus femoris    Muscles Treated Back/Hip  Tensor fascia lata    Dry Needling Comments  Lt side only    Rectus femoris Response  Twitch response elicited;Palpable increased muscle length     Tensor Fascia Lata Response  Twitch response elicited;Palpable increased muscle length                PT Long Term Goals - 02/08/19 1148      PT LONG TERM GOAL #1   Title  Improve standing posture and alignment with patient to demonstrate neutral hip extension bilat 03/22/2019    Time  6    Period  Weeks    Status  Revised      PT LONG TERM GOAL #2   Title  Patient demonstrate improved gait pattern with least assistive device and safe gait pattern and minimal to no pain 03/22/2019    Time  6    Period  Weeks    Status  Revised      PT LONG TERM GOAL #3   Title  Independent in all transfers with good body mechanics 03/22/2019    Time  6    Period  Weeks    Status  Revised      PT LONG TERM GOAL #4   Title  Independent in HEP 03/22/2019    Time  6    Period  Weeks    Status  Revised      PT LONG TERM GOAL #5   Title  Improve FOTO to </= 37% limitation 03/22/2019    Time  6    Period  Weeks    Status  Revised            Plan - 02/10/19 1433    Clinical Impression Statement  Pt reported good tolerance to exercises from HEP. Pt requires increased time transitioning in between exercises due to shortness of breath and cramping in LEs.  Supervising PT, Moshe Cipro, performed DN to LLE for pt today; pt reported improved movement in LLE in gait afterwards. Pt only able to tolerate 10 min of MHP/estim; requested to end session.  Goals are ongoing at this time.     Rehab Potential  Good    PT Frequency  2x / week    PT Duration  6 weeks    PT Treatment/Interventions  Patient/family education;ADLs/Self Care Home Management;Cryotherapy;Lobbyist  Stimulation;Iontophoresis /ml Dexamethasone;Moist Heat;Ultrasound;Therapeutic activities;Therapeutic exercise;Balance training;Neuromuscular re-education;Gait training;Manual techniques;Dry needling    PT Next Visit Plan  assess response to DN.  continue progressing ROM, flexibility and strengthening as tolerated.      Consulted and Agree with Plan of Care  Patient       Patient will benefit from skilled therapeutic intervention in order to improve the following deficits and impairments:  Abnormal gait, Improper body mechanics, Postural dysfunction, Pain, Increased fascial restricitons, Increased muscle spasms, Hypomobility, Decreased mobility, Decreased range of motion, Decreased activity tolerance, Decreased endurance  Visit Diagnosis: Pain in left hip  Pain in right hip  Other symptoms and signs involving the musculoskeletal system  Other abnormalities of gait and mobility     Problem List Patient Active Problem List   Diagnosis Date Noted  . On home oxygen therapy 11/23/2018  . Environmental allergies 11/23/2018  . White coat syndrome with diagnosis of hypertension 11/23/2018  . COPD (chronic obstructive pulmonary disease) (HCC) 11/03/2018  . Hypertension goal BP (blood pressure) < 140/90 11/03/2018  . Familial hypercholanemia 11/03/2018  . HLD (hyperlipidemia) 11/03/2018  . Vitamin D deficiency 11/03/2018  . Hypothyroid 11/03/2018  . Von Willebrand disease (HCC) possible 11/03/2018  . Elevated uric acid in blood 11/03/2018  . Centrilobular emphysema (HCC) 11/03/2018  . Hypoxia 11/03/2018  . Elevated troponin 09/18/2016   Mayer Camel, PTA 02/10/19 3:08 PM   Clarita Crane, PT, DPT 02/10/19 3:08 PM    [facility] [place] [address] [place], [place], [address] Phone: [phone]   Fax:  [phone]  Name: Jennifer [name] MRN: [id number] Date of Birth: 05-Dec-1945

## 2019-02-14 ENCOUNTER — Ambulatory Visit (INDEPENDENT_AMBULATORY_CARE_PROVIDER_SITE_OTHER): Payer: Medicare HMO | Admitting: Physical Therapy

## 2019-02-14 ENCOUNTER — Other Ambulatory Visit: Payer: Self-pay

## 2019-02-14 ENCOUNTER — Encounter: Payer: Self-pay | Admitting: Physical Therapy

## 2019-02-14 DIAGNOSIS — M25551 Pain in right hip: Secondary | ICD-10-CM | POA: Diagnosis not present

## 2019-02-14 DIAGNOSIS — M25552 Pain in left hip: Secondary | ICD-10-CM | POA: Diagnosis not present

## 2019-02-14 DIAGNOSIS — R29898 Other symptoms and signs involving the musculoskeletal system: Secondary | ICD-10-CM

## 2019-02-14 NOTE — Therapy (Signed)
[facility] [address] [place], [place], [address] Phone: [phone]   Fax:  [phone]  Physical Therapy Treatment  Patient Details  Name: Jennifer KernsSheryl Sargent MRN: [id number] Date of Birth: 02-27-1946 Referring Provider (PT): Dr Denyse Amassorey    Encounter Date: 02/14/2019  PT End of Session - 02/14/19 1251    Visit Number  4    Number of Visits  12    Date for PT Re-Evaluation  03/22/19    PT Start Time  1200    PT Stop Time  1252   MHP last 12 min    PT Time Calculation (min)  52 min       Past Medical History:  Diagnosis Date  . COPD (chronic obstructive pulmonary disease) (HCC) 11/03/2018  . Familial hypercholanemia 11/03/2018  . HLD (hyperlipidemia) 11/03/2018  . HTN (hypertension) 11/03/2018  . Hypothyroid 11/03/2018  . Vitamin D deficiency 11/03/2018  . Von Willebrand disease (HCC) possible 11/03/2018   Seen in documentation from prior provider    Past Surgical History:  Procedure Laterality Date  . APPENDECTOMY  1978  . BIOPSY ENDOMETRIAL    . CESAREAN SECTION     triplets  . TUBAL LIGATION      There were no vitals filed for this visit.  Subjective Assessment - 02/14/19 1201    Subjective  "It was a rough couple of days." (re: after last session).  Pt reports she was able to lift Left leg in middle of night, better than she was able to before DN.     Pertinent History  asthma; copd; HTN    Patient Stated Goals  get off the cane; get in and out of the bed more easily; get up and down easily     Currently in Pain?  Yes    Pain Score  2     Pain Location  Hip         [facility] PT Assessment - 02/14/19 0001      Assessment   Medical Diagnosis  Bilat hip pain Lt > Rt     Referring Provider (PT)  Dr Denyse Amassorey     Onset Date/Surgical Date  06/08/18    Hand Dominance  Right    Next MD Visit  not scheduled yet.     Prior Therapy  none        [facility] Adult PT Treatment/Exercise - 02/14/19 0001      Knee/Hip  Exercises: Standing   Side Lunges  Right;Left;1 set;10 reps   gentle rocking back and forth into lunge; UE on counter   Hip Abduction  Stengthening;Right;Left;1 set;5 reps   cues for parallel feet   Other Standing Knee Exercises  standing trunk ext, leaning hips into counter x 8 reps      Knee/Hip Exercises: Seated   Sit to Sand  5 reps;with UE support;2 sets   cues for hip hinge, and control     Moist Heat Therapy   Number Minutes Moist Heat  12 Minutes    Moist Heat Location  Hip   Lt     Manual Therapy   Manual Therapy  Passive ROM;Myofascial release    Manual therapy comments  pt in supported supine     Soft tissue mobilization  TPR to Lt iliopsoas    Myofascial Release  Lt adductors, Lt lateral quad/ITB mid to proximal    Passive ROM  Hip flexion (with knee flexion) into outward circumduction of LE x 5-8 reps each leg (  very guarded)           PT Long Term Goals - 02/08/19 1148      PT LONG TERM GOAL #1   Title  Improve standing posture and alignment with patient to demonstrate neutral hip extension bilat 03/22/2019    Time  6    Period  Weeks    Status  Revised      PT LONG TERM GOAL #2   Title  Patient demonstrate improved gait pattern with least assistive device and safe gait pattern and minimal to no pain 03/22/2019    Time  6    Period  Weeks    Status  Revised      PT LONG TERM GOAL #3   Title  Independent in all transfers with good body mechanics 03/22/2019    Time  6    Period  Weeks    Status  Revised      PT LONG TERM GOAL #4   Title  Independent in HEP 03/22/2019    Time  6    Period  Weeks    Status  Revised      PT LONG TERM GOAL #5   Title  Improve FOTO to </= 37% limitation 03/22/2019    Time  6    Period  Weeks    Status  Revised            Plan - 02/14/19 1256    Clinical Impression Statement  Pt had positive response to DN last session. Pt tolerated gentle standing LE exercises, without report of increase in pain.  She tolerated  MFR to Lt thigh/hip and TPR to Lt iliopsoas with good release of tissue.  Pt demonstrated improved ability to move from supine to sitting (without assistance from therapist).  Pt progressing towards goals.       Rehab Potential  Good    PT Frequency  2x / week    PT Duration  6 weeks    PT Treatment/Interventions  Patient/family education;ADLs/Self Care Home Management;Cryotherapy;Electrical Stimulation;Iontophoresis 4mg /ml Dexamethasone;Moist Heat;Ultrasound;Therapeutic activities;Therapeutic exercise;Balance training;Neuromuscular re-education;Gait training;Manual techniques;Dry needling    PT Next Visit Plan  continue progressing ROM, flexibility and strengthening as tolerated.     Consulted and Agree with Plan of Care  Patient       Patient will benefit from skilled therapeutic intervention in order to improve the following deficits and impairments:  Abnormal gait, Improper body mechanics, Postural dysfunction, Pain, Increased fascial restricitons, Increased muscle spasms, Hypomobility, Decreased mobility, Decreased range of motion, Decreased activity tolerance, Decreased endurance  Visit Diagnosis: Pain in left hip  Pain in right hip  Other symptoms and signs involving the musculoskeletal system     Problem List Patient Active Problem List   Diagnosis Date Noted  . On home oxygen therapy 11/23/2018  . Environmental allergies 11/23/2018  . White coat syndrome with diagnosis of hypertension 11/23/2018  . COPD (chronic obstructive pulmonary disease) ([place]) 11/03/2018  . Hypertension goal BP (blood pressure) < 140/90 11/03/2018  . Familial hypercholanemia 11/03/2018  . HLD (hyperlipidemia) 11/03/2018  . Vitamin D deficiency 11/03/2018  . Hypothyroid 11/03/2018  . Von Willebrand disease ([place]) possible 11/03/2018  . Elevated uric acid in blood 11/03/2018  . Centrilobular emphysema ([place]) 11/03/2018  . Hypoxia 11/03/2018  . Elevated troponin 09/18/2016    Kerin Perna,  PTA 02/14/19 1:17 PM  [facility] [place], [place], [address] Phone: [phone]   Fax:  [phone]  Name: Jennifer KernsSheryl Arenas MRN: [id number] Date of Birth: 1945-12-28

## 2019-02-15 ENCOUNTER — Encounter: Payer: Medicare HMO | Admitting: Physical Therapy

## 2019-02-17 ENCOUNTER — Encounter: Payer: Medicare HMO | Admitting: Physical Therapy

## 2019-02-18 ENCOUNTER — Encounter: Payer: Medicare HMO | Admitting: Physical Therapy

## 2019-02-21 ENCOUNTER — Ambulatory Visit (INDEPENDENT_AMBULATORY_CARE_PROVIDER_SITE_OTHER): Payer: Medicare HMO | Admitting: Physical Therapy

## 2019-02-21 ENCOUNTER — Encounter: Payer: Self-pay | Admitting: Physical Therapy

## 2019-02-21 ENCOUNTER — Other Ambulatory Visit: Payer: Self-pay

## 2019-02-21 DIAGNOSIS — R2689 Other abnormalities of gait and mobility: Secondary | ICD-10-CM | POA: Diagnosis not present

## 2019-02-21 DIAGNOSIS — R29898 Other symptoms and signs involving the musculoskeletal system: Secondary | ICD-10-CM

## 2019-02-21 DIAGNOSIS — M25552 Pain in left hip: Secondary | ICD-10-CM

## 2019-02-21 DIAGNOSIS — M25551 Pain in right hip: Secondary | ICD-10-CM | POA: Diagnosis not present

## 2019-02-21 NOTE — Therapy (Signed)
[place], [place], [address] Phone: [phone]   Fax:  [phone]  Physical Therapy Treatment  Patient Details  Name: Jennifer [name] MRN: [id number] Date of Birth: April 04, 1946 Referring Provider (PT): Dr Georgina Snell    Encounter Date: 02/21/2019  PT End of Session - 02/21/19 1514    Visit Number  5    Number of Visits  12    Date for PT Re-Evaluation  03/22/19    PT Start Time  1430    PT Stop Time  1522    PT Time Calculation (min)  52 min    Activity Tolerance  Patient tolerated treatment well    Behavior During Therapy  [facility] for tasks assessed/performed       Past Medical History:  Diagnosis Date  . COPD (chronic obstructive pulmonary disease) ([place]) 11/03/2018  . Familial hypercholanemia 11/03/2018  . HLD (hyperlipidemia) 11/03/2018  . HTN (hypertension) 11/03/2018  . Hypothyroid 11/03/2018  . Vitamin D deficiency 11/03/2018  . Von Willebrand disease ([place]) possible 11/03/2018   Seen in documentation from prior provider    Past Surgical History:  Procedure Laterality Date  . APPENDECTOMY  1978  . BIOPSY ENDOMETRIAL    . CESAREAN SECTION     triplets  . TUBAL LIGATION      There were no vitals filed for this visit.  Subjective Assessment - 02/21/19 1430    Subjective  doing "okay" compliant with stretches then reports taking tylenol following    Pertinent History  asthma; copd; HTN    Patient Stated Goals  get off the cane; get in and out of the bed more easily; get up and down easily     Currently in Pain?  Yes    Pain Score  2     Pain Location  Hip    Pain Orientation  Left    Pain Descriptors / Indicators  Aching;Tightness    Pain Type  Chronic pain    Pain Onset  More than a month ago    Pain Frequency  Intermittent    Aggravating Factors   worse in the mornings - sleeping in the recliner    Pain Relieving Factors  meds                       [facility] Adult PT  Treatment/Exercise - 02/21/19 1434      Knee/Hip Exercises: Aerobic   Nustep  L5 x 5 min      Knee/Hip Exercises: Standing   Heel Raises  Both;10 reps    Hip Abduction  Stengthening;Right;Left;10 reps;Knee straight    Hip Extension  Right;Left;10 reps      Moist Heat Therapy   Number Minutes Moist Heat  10 Minutes    Moist Heat Location  Hip      Manual Therapy   Manual Therapy  Joint mobilization    Manual therapy comments  pt in supported supine     Soft tissue mobilization  TPR to Lt iliopsoas, STM to lateral quad and TFL on Lt    Passive ROM  Lt hip LAD 3x15 sec       Trigger Point Dry Needling - 02/21/19 1459    Consent Given?  Yes    Education Handout Provided  Previously provided    Muscles Treated Lower Quadrant  Vastus lateralis    Muscles Treated Back/Hip  Tensor fascia lata    Electrical Stimulation Performed with Dry Needling  Yes    E-stim with Dry Needling Details  lateral quad to tolerance (2 sets of leads)    Vastus lateralis Response  Twitch response elicited;Palpable increased muscle length    Tensor Fascia Lata Response  Twitch response elicited;Palpable increased muscle length                PT Long Term Goals - 02/08/19 1148      PT LONG TERM GOAL #1   Title  Improve standing posture and alignment with patient to demonstrate neutral hip extension bilat 03/22/2019    Time  6    Period  Weeks    Status  Revised      PT LONG TERM GOAL #2   Title  Patient demonstrate improved gait pattern with least assistive device and safe gait pattern and minimal to no pain 03/22/2019    Time  6    Period  Weeks    Status  Revised      PT LONG TERM GOAL #3   Title  Independent in all transfers with good body mechanics 03/22/2019    Time  6    Period  Weeks    Status  Revised      PT LONG TERM GOAL #4   Title  Independent in HEP 03/22/2019    Time  6    Period  Weeks    Status  Revised      PT LONG TERM GOAL #5   Title  Improve FOTO to </= 37%  limitation 03/22/2019    Time  6    Period  Weeks    Status  Revised            Plan - 02/21/19 1514    Clinical Impression Statement  Pt with positive response to DN today with twitch responses in all muscles groups, and all needle sticks.  Overall slowly progressing with PT, needs mod cues for technique with exercises.    Rehab Potential  Good    PT Frequency  2x / week    PT Duration  6 weeks    PT Treatment/Interventions  Patient/family education;ADLs/Self Care Home Management;Cryotherapy;Electrical Stimulation;Iontophoresis 4mg /ml Dexamethasone;Moist Heat;Ultrasound;Therapeutic activities;Therapeutic exercise;Balance training;Neuromuscular re-education;Gait training;Manual techniques;Dry needling    PT Next Visit Plan  continue progressing ROM, flexibility and strengthening as tolerated.     Consulted and Agree with Plan of Care  Patient       Patient will benefit from skilled therapeutic intervention in order to improve the following deficits and impairments:  Abnormal gait, Improper body mechanics, Postural dysfunction, Pain, Increased fascial restricitons, Increased muscle spasms, Hypomobility, Decreased mobility, Decreased range of motion, Decreased activity tolerance, Decreased endurance  Visit Diagnosis: Pain in left hip  Pain in right hip  Other symptoms and signs involving the musculoskeletal system  Other abnormalities of gait and mobility     Problem List Patient Active Problem List   Diagnosis Date Noted  . On home oxygen therapy 11/23/2018  . Environmental allergies 11/23/2018  . White coat syndrome with diagnosis of hypertension 11/23/2018  . COPD (chronic obstructive pulmonary disease) (HCC) 11/03/2018  . Hypertension goal BP (blood pressure) < 140/90 11/03/2018  . Familial hypercholanemia 11/03/2018  . HLD (hyperlipidemia) 11/03/2018  . Vitamin D deficiency 11/03/2018  . Hypothyroid 11/03/2018  . Von Willebrand disease (HCC) possible 11/03/2018  .  Elevated uric acid in blood 11/03/2018  . Centrilobular emphysema (HCC) 11/03/2018  . Hypoxia 11/03/2018  . Elevated troponin 09/18/2016      Judeth CornfieldStephanie  Maudry MayhewF Alrick Cubbage, PT, DPT 02/21/19 3:17 PM     [facility] [address] [place], [place], [address] Phone: [phone]   Fax:  [phone]  Name: Jennifer KernsSheryl [name] MRN: [id number] Date of Birth: 15-Oct-1945

## 2019-02-24 ENCOUNTER — Encounter: Payer: Self-pay | Admitting: Physical Therapy

## 2019-02-24 ENCOUNTER — Ambulatory Visit (INDEPENDENT_AMBULATORY_CARE_PROVIDER_SITE_OTHER): Payer: Medicare HMO | Admitting: Physical Therapy

## 2019-02-24 ENCOUNTER — Other Ambulatory Visit: Payer: Self-pay

## 2019-02-24 DIAGNOSIS — M25551 Pain in right hip: Secondary | ICD-10-CM

## 2019-02-24 DIAGNOSIS — R2689 Other abnormalities of gait and mobility: Secondary | ICD-10-CM | POA: Diagnosis not present

## 2019-02-24 DIAGNOSIS — R29898 Other symptoms and signs involving the musculoskeletal system: Secondary | ICD-10-CM | POA: Diagnosis not present

## 2019-02-24 DIAGNOSIS — M25552 Pain in left hip: Secondary | ICD-10-CM

## 2019-02-24 NOTE — Therapy (Signed)
[facility] [address] [place], [place], [address] Phone: [phone]   Fax:  [phone]  Physical Therapy Treatment  Patient Details  Name: Jennifer KernsSheryl [name] MRN: [id number] Date of Birth: 30-Mar-1946 Referring Provider (PT): Dr Denyse Amassorey    Encounter Date: 02/24/2019  PT End of Session - 02/24/19 1518    Visit Number  6    Number of Visits  12    Date for PT Re-Evaluation  03/22/19    PT Start Time  1432    PT Stop Time  1525    PT Time Calculation (min)  53 min    Activity Tolerance  Patient tolerated treatment well    Behavior During Therapy  [facility] for tasks assessed/performed       Past Medical History:  Diagnosis Date  . COPD (chronic obstructive pulmonary disease) (HCC) 11/03/2018  . Familial hypercholanemia 11/03/2018  . HLD (hyperlipidemia) 11/03/2018  . HTN (hypertension) 11/03/2018  . Hypothyroid 11/03/2018  . Vitamin D deficiency 11/03/2018  . Von Willebrand disease (HCC) possible 11/03/2018   Seen in documentation from prior provider    Past Surgical History:  Procedure Laterality Date  . APPENDECTOMY  1978  . BIOPSY ENDOMETRIAL    . CESAREAN SECTION     triplets  . TUBAL LIGATION      There were no vitals filed for this visit.  Subjective Assessment - 02/24/19 1433    Subjective  "I'm tired of hurting."  Pt tearful today.  After Monday session, woke up on Tuesday doing well and without pain.  Then she did her exercises and pain returned.  Insoles ordered for her shoes.    Pertinent History  asthma; copd; HTN    Patient Stated Goals  get off the cane; get in and out of the bed more easily; get up and down easily     Currently in Pain?  Yes    Pain Score  3    up to 5-6/10   Pain Location  Hip    Pain Orientation  Left    Pain Descriptors / Indicators  Aching;Tightness    Pain Type  Chronic pain    Pain Onset  More than a month ago    Pain Frequency  Intermittent    Aggravating Factors   worse in  the mornings - sleeping in the recliner    Pain Relieving Factors  meds         [facility] - 02/24/19 1439      Assessment   Medical Diagnosis  Bilat hip pain Lt > Rt     Referring Provider (PT)  Dr Denyse Amassorey     Onset Date/Surgical Date  06/08/18    Hand Dominance  Right                   [facility] Adult PT Treatment/Exercise - 02/24/19 1445      Self-Care   Self-Care  Other Self-Care Comments    Other Self-Care Comments   discussed treatment options, possible referral to pain management and offered support as pt expressed frustration with pain      Knee/Hip Exercises: Stretches   Passive Hamstring Stretch  Both;2 reps;30 seconds    Gastroc Stretch  Both;2 reps;30 seconds      Moist Heat Therapy   Number Minutes Moist Heat  10 Minutes    Moist Heat Location  Hip      Manual Therapy   Manual therapy comments  pt in supported  supine     Soft tissue mobilization  TPR to Lt iliopsoas, STM to lateral quad and TFL on Lt       Trigger Point Dry Needling - 02/24/19 1517    Consent Given?  Yes    Education Handout Provided  Previously provided    Muscles Treated Lower Quadrant  Hamstring    Muscles Treated Back/Hip  Tensor fascia lata    Electrical Stimulation Performed with Dry Needling  Yes    E-stim with Dry Needling Details  TFL and lateral hamstring to tolerance (2 sets of leads)    Hamstring Response  Twitch response elicited;Palpable increased muscle length    Tensor Fascia Lata Response  Twitch response elicited;Palpable increased muscle length                PT Long Term Goals - 02/08/19 1148      PT LONG TERM GOAL #1   Title  Improve standing posture and alignment with patient to demonstrate neutral hip extension bilat 03/22/2019    Time  6    Period  Weeks    Status  Revised      PT LONG TERM GOAL #2   Title  Patient demonstrate improved gait pattern with least assistive device and safe gait pattern and minimal to no pain 03/22/2019     Time  6    Period  Weeks    Status  Revised      PT LONG TERM GOAL #3   Title  Independent in all transfers with good body mechanics 03/22/2019    Time  6    Period  Weeks    Status  Revised      PT LONG TERM GOAL #4   Title  Independent in HEP 03/22/2019    Time  6    Period  Weeks    Status  Revised      PT LONG TERM GOAL #5   Title  Improve FOTO to </= 37% limitation 03/22/2019    Time  6    Period  Weeks    Status  Revised            Plan - 02/24/19 1519    Clinical Impression Statement  Session today mainly focued on emotional support and manual therapy as pt expressed frustration with pain.  Overall positive response to DN last session with no pain next day, which returned with activity.  Will continue to benefit from PT to address deficits and maximize function.    Rehab Potential  Good    PT Frequency  2x / week    PT Duration  6 weeks    PT Treatment/Interventions  Patient/family education;ADLs/Self Care Home Management;Cryotherapy;Electrical Stimulation;Iontophoresis 4mg /ml Dexamethasone;Moist Heat;Ultrasound;Therapeutic activities;Therapeutic exercise;Balance training;Neuromuscular re-education;Gait training;Manual techniques;Dry needling    PT Next Visit Plan  continue progressing ROM, flexibility and strengthening as tolerated.     Consulted and Agree with Plan of Care  Patient       Patient will benefit from skilled therapeutic intervention in order to improve the following deficits and impairments:  Abnormal gait, Improper body mechanics, Postural dysfunction, Pain, Increased fascial restricitons, Increased muscle spasms, Hypomobility, Decreased mobility, Decreased range of motion, Decreased activity tolerance, Decreased endurance  Visit Diagnosis: 1. Pain in left hip   2. Pain in right hip   3. Other symptoms and signs involving the musculoskeletal system   4. Other abnormalities of gait and mobility        Problem List Patient Active Problem  List    Diagnosis Date Noted  . On home oxygen therapy 11/23/2018  . Environmental allergies 11/23/2018  . White coat syndrome with diagnosis of hypertension 11/23/2018  . COPD (chronic obstructive pulmonary disease) ([place]) 11/03/2018  . Hypertension goal BP (blood pressure) < 140/90 11/03/2018  . Familial hypercholanemia 11/03/2018  . HLD (hyperlipidemia) 11/03/2018  . Vitamin D deficiency 11/03/2018  . Hypothyroid 11/03/2018  . Von Willebrand disease ([place]) possible 11/03/2018  . Elevated uric acid in blood 11/03/2018  . Centrilobular emphysema ([place]) 11/03/2018  . Hypoxia 11/03/2018  . Elevated troponin 09/18/2016      Laureen Abrahams, PT, DPT 02/24/19 3:21 PM     [facility] [place], [place], [address] Phone: [phone]   Fax:  [phone]  Name: Jennifer [name] MRN: [id number] Date of Birth: February 22, 1946

## 2019-03-02 ENCOUNTER — Encounter: Payer: Self-pay | Admitting: Physical Therapy

## 2019-03-02 ENCOUNTER — Ambulatory Visit (INDEPENDENT_AMBULATORY_CARE_PROVIDER_SITE_OTHER): Payer: Medicare HMO | Admitting: Physical Therapy

## 2019-03-02 ENCOUNTER — Other Ambulatory Visit: Payer: Self-pay

## 2019-03-02 DIAGNOSIS — R29898 Other symptoms and signs involving the musculoskeletal system: Secondary | ICD-10-CM | POA: Diagnosis not present

## 2019-03-02 DIAGNOSIS — R2689 Other abnormalities of gait and mobility: Secondary | ICD-10-CM | POA: Diagnosis not present

## 2019-03-02 DIAGNOSIS — M25552 Pain in left hip: Secondary | ICD-10-CM | POA: Diagnosis not present

## 2019-03-02 DIAGNOSIS — M25551 Pain in right hip: Secondary | ICD-10-CM | POA: Diagnosis not present

## 2019-03-02 NOTE — Therapy (Signed)
[facility] [address] [place], [place], [address] Phone: [phone]   Fax:  [phone]  Physical Therapy Treatment  Patient Details  Name: Jennifer KernsSheryl [name] MRN: [id number] Date of Birth: [birth year] Referring Provider (PT): Dr Denyse Amassorey    Encounter Date: 03/02/2019  PT End of Session - 03/02/19 1208    Visit Number  7    Number of Visits  12    Date for PT Re-Evaluation  03/22/19    PT Start Time  1203    PT Stop Time  1244    PT Time Calculation (min)  41 min    Activity Tolerance  Patient tolerated treatment well    Behavior During Therapy  [facility] for tasks assessed/performed       Past Medical History:  Diagnosis Date  . COPD (chronic obstructive pulmonary disease) (HCC) 11/03/2018  . Familial hypercholanemia 11/03/2018  . HLD (hyperlipidemia) 11/03/2018  . HTN (hypertension) 11/03/2018  . Hypothyroid 11/03/2018  . Vitamin D deficiency 11/03/2018  . Von Willebrand disease (HCC) possible 11/03/2018   Seen in documentation from prior provider    Past Surgical History:  Procedure Laterality Date  . APPENDECTOMY  1978  . BIOPSY ENDOMETRIAL    . CESAREAN SECTION     triplets  . TUBAL LIGATION      There were no vitals filed for this visit.  Subjective Assessment - 03/02/19 1212    Subjective  Pt reports she has had less pain in her legs since last few visits.    Pertinent History  asthma; copd; HTN    Patient Stated Goals  get off the cane; get in and out of the bed more easily; get up and down easily     Currently in Pain?  Yes    Pain Score  2     Pain Location  Hip    Pain Orientation  Right;Left    Pain Descriptors / Indicators  Tightness    Pain Onset  More than a month ago    Aggravating Factors   getting up from recliner    Pain Relieving Factors  massage, medicine         [facility] - 03/02/19 0001      Assessment   Medical Diagnosis  Bilat hip pain Lt > Rt     Referring Provider (PT)   Dr Denyse Amassorey     Onset Date/Surgical Date  06/08/18    Hand Dominance  Right       [facility] Adult PT Treatment/Exercise - 03/02/19 0001      Knee/Hip Exercises: Standing   Gait Training  cues for posture and placement of cane (closer to body) x 80 ft (improvement with increased distance)    Other Standing Knee Exercises  standing trunk ext, leaning hips into counter x 8 reps    Other Standing Knee Exercises  standing hip mobility exercise (painting name on floor with imaginary horse tail)       Knee/Hip Exercises: Seated   Clamshell with TheraBand  Yellow   3 sets of 5 reps   Other Seated Knee/Hip Exercises  seated on dynadisc:  pelvic tilts forward/backward and side to side,  CW/CCw     Sit to Sand  1 set;without UE support;5 reps   sitting onto dynadisc     Knee/Hip Exercises: Supine   Heel Slides  --   1 rep for stretch of LLE   Bridges  1 set;5 reps  Modalities   Modalities  --   pt declined; will do at home.      Manual Therapy   Manual Therapy  Soft tissue mobilization;Myofascial release;Joint mobilization;Passive ROM    Manual therapy comments  pt in supported supine     Joint Mobilization  gentle anterior hip mobs     Soft tissue mobilization  TPR to Rt/ Lt iliopsoas     Myofascial Release  Lt/Rt lateral quad     Passive ROM  Lt/ Rt circumdution of LE with knee flexed (very guarded with LLE); gentle rocking IR/ER with legs in supported hooklying                   PT Long Term Goals - 02/08/19 1148      PT LONG TERM GOAL #1   Title  Improve standing posture and alignment with patient to demonstrate neutral hip extension bilat 03/22/2019    Time  6    Period  Weeks    Status  Revised      PT LONG TERM GOAL #2   Title  Patient demonstrate improved gait pattern with least assistive device and safe gait pattern and minimal to no pain 03/22/2019    Time  6    Period  Weeks    Status  Revised      PT LONG TERM GOAL #3   Title  Independent in all transfers  with good body mechanics 03/22/2019    Time  6    Period  Weeks    Status  Revised      PT LONG TERM GOAL #4   Title  Independent in HEP 03/22/2019    Time  6    Period  Weeks    Status  Revised      PT LONG TERM GOAL #5   Title  Improve FOTO to </= 37% limitation 03/22/2019    Time  6    Period  Weeks    Status  Revised            Plan - 03/02/19 1216    Clinical Impression Statement  Pt reporting improved functional movement - able to get up and down from chair and bed easier. Pt has had positive response to DN last 2 sessions.  Pt tolerated exercises well, with 5 rep sets and short rest  breaks in between.  Pt reported some soreness at end of session following TPR to iliopsoas; pt declined modalities.  PRogressing gradually towards goals.    Rehab Potential  Good    PT Frequency  2x / week    PT Duration  6 weeks    PT Treatment/Interventions  Patient/family education;ADLs/Self Care Home Management;Cryotherapy;Electrical Stimulation;Iontophoresis 4mg /ml Dexamethasone;Moist Heat;Ultrasound;Therapeutic activities;Therapeutic exercise;Balance training;Neuromuscular re-education;Gait training;Manual techniques;Dry needling    PT Next Visit Plan  continue progressing ROM, flexibility and strengthening as tolerated.     Consulted and Agree with Plan of Care  Patient       Patient will benefit from skilled therapeutic intervention in order to improve the following deficits and impairments:  Abnormal gait, Improper body mechanics, Postural dysfunction, Pain, Increased fascial restricitons, Increased muscle spasms, Hypomobility, Decreased mobility, Decreased range of motion, Decreased activity tolerance, Decreased endurance  Visit Diagnosis: 1. Pain in left hip   2. Pain in right hip   3. Other symptoms and signs involving the musculoskeletal system   4. Other abnormalities of gait and mobility        Problem List Patient Active Problem  List   Diagnosis Date Noted  . On home  oxygen therapy 11/23/2018  . Environmental allergies 11/23/2018  . White coat syndrome with diagnosis of hypertension 11/23/2018  . COPD (chronic obstructive pulmonary disease) (HCC) 11/03/2018  . Hypertension goal BP (blood pressure) < 140/90 11/03/2018  . Familial hypercholanemia 11/03/2018  . HLD (hyperlipidemia) 11/03/2018  . Vitamin D deficiency 11/03/2018  . Hypothyroid 11/03/2018  . Von Willebrand disease (HCC) possible 11/03/2018  . Elevated uric acid in blood 11/03/2018  . Centrilobular emphysema (HCC) 11/03/2018  . Hypoxia 11/03/2018  . Elevated troponin 09/18/2016   Mayer CamelJennifer Carlson-Long, PTA 03/02/19 1:02 PM  [facility] [address] [place], [place], [address] Phone: [phone]   Fax:  [phone]  Name: Jennifer KernsSheryl [name] MRN: [id number] Date of Birth: Aug 17, 1946

## 2019-03-04 ENCOUNTER — Ambulatory Visit (INDEPENDENT_AMBULATORY_CARE_PROVIDER_SITE_OTHER): Payer: Medicare HMO | Admitting: Physical Therapy

## 2019-03-04 ENCOUNTER — Other Ambulatory Visit: Payer: Self-pay

## 2019-03-04 ENCOUNTER — Encounter: Payer: Self-pay | Admitting: Physical Therapy

## 2019-03-04 VITALS — HR 110

## 2019-03-04 DIAGNOSIS — R29898 Other symptoms and signs involving the musculoskeletal system: Secondary | ICD-10-CM

## 2019-03-04 DIAGNOSIS — M25552 Pain in left hip: Secondary | ICD-10-CM | POA: Diagnosis not present

## 2019-03-04 DIAGNOSIS — M25551 Pain in right hip: Secondary | ICD-10-CM | POA: Diagnosis not present

## 2019-03-04 DIAGNOSIS — R2689 Other abnormalities of gait and mobility: Secondary | ICD-10-CM | POA: Diagnosis not present

## 2019-03-04 NOTE — Therapy (Signed)
[place], [place], [address] Phone: [phone]   Fax:  [phone]  Physical Therapy Treatment  Patient Details  Name: Jennifer [name] MRN: [id number] Date of Birth: December 19, 1945 Referring Provider (PT): Dr Georgina Snell    Encounter Date: 03/04/2019  PT End of Session - 03/04/19 1116    Visit Number  8    Number of Visits  12    Date for PT Re-Evaluation  03/22/19    PT Start Time  1033    PT Stop Time  1115   hot pack x 3 min   PT Time Calculation (min)  42 min    Activity Tolerance  Patient tolerated treatment well    Behavior During Therapy  [facility] for tasks assessed/performed       Past Medical History:  Diagnosis Date  . COPD (chronic obstructive pulmonary disease) ([place]) 11/03/2018  . Familial hypercholanemia 11/03/2018  . HLD (hyperlipidemia) 11/03/2018  . HTN (hypertension) 11/03/2018  . Hypothyroid 11/03/2018  . Vitamin D deficiency 11/03/2018  . Von Willebrand disease ([place]) possible 11/03/2018   Seen in documentation from prior provider    Past Surgical History:  Procedure Laterality Date  . APPENDECTOMY  1978  . BIOPSY ENDOMETRIAL    . CESAREAN SECTION     triplets  . TUBAL LIGATION      Vitals:   03/04/19 1035 03/04/19 1048  Pulse: (!) 110 (!) 110  SpO2: 93% 95%    Subjective Assessment - 03/04/19 1035    Subjective  pain in hamstrings and quads resolved.  still having pain in bil hips.    Pertinent History  asthma; copd; HTN    Patient Stated Goals  get off the cane; get in and out of the bed more easily; get up and down easily     Currently in Pain?  Yes    Pain Score  3     Pain Location  Hip    Pain Orientation  Left    Pain Descriptors / Indicators  Dull;Aching;Burning    Pain Type  Chronic pain    Pain Onset  More than a month ago    Pain Frequency  Intermittent    Aggravating Factors   getting up from recliner    Pain Relieving Factors  massage, medicine                        [facility] Adult PT Treatment/Exercise - 03/04/19 1036      Knee/Hip Exercises: Stretches   Passive Hamstring Stretch  Both;2 reps;30 seconds    Passive Hamstring Stretch Limitations  seated      Knee/Hip Exercises: Aerobic   Nustep  L5 x 8 min      Moist Heat Therapy   Number Minutes Moist Heat  3 Minutes   pt needed to stop due to cramp in feet   Moist Heat Location  Hip      Manual Therapy   Manual Therapy  Soft tissue mobilization    Manual therapy comments  pt in supported supine     Soft tissue mobilization  TPR to Rt/ Lt iliopsoas        Trigger Point Dry Needling - 03/04/19 1111    Consent Given?  Yes    Education Handout Provided  Previously provided    Muscles Treated Lower Quadrant  Rectus femoris    Muscles Treated Back/Hip  Iliacus;Tensor fascia lata    Dry Needling Comments  Lt side only    Rectus femoris Response  Twitch response elicited;Palpable increased muscle length    Tensor Fascia Lata Response  Twitch response elicited;Palpable increased muscle length    Iliacus Response  Twitch response elicited;Palpable increased muscle length                PT Long Term Goals - 02/08/19 1148      PT LONG TERM GOAL #1   Title  Improve standing posture and alignment with patient to demonstrate neutral hip extension bilat 03/22/2019    Time  6    Period  Weeks    Status  Revised      PT LONG TERM GOAL #2   Title  Patient demonstrate improved gait pattern with least assistive device and safe gait pattern and minimal to no pain 03/22/2019    Time  6    Period  Weeks    Status  Revised      PT LONG TERM GOAL #3   Title  Independent in all transfers with good body mechanics 03/22/2019    Time  6    Period  Weeks    Status  Revised      PT LONG TERM GOAL #4   Title  Independent in HEP 03/22/2019    Time  6    Period  Weeks    Status  Revised      PT LONG TERM GOAL #5   Title  Improve FOTO to </= 37% limitation 03/22/2019     Time  6    Period  Weeks    Status  Revised            Plan - 03/04/19 1117    Clinical Impression Statement  Pt demonstrating improved mobility with pain improving.  No goals met to date.  Progressing well with PT.    Rehab Potential  Good    PT Frequency  2x / week    PT Duration  6 weeks    PT Treatment/Interventions  Patient/family education;ADLs/Self Care Home Management;Cryotherapy;Electrical Stimulation;Iontophoresis 67m/ml Dexamethasone;Moist Heat;Ultrasound;Therapeutic activities;Therapeutic exercise;Balance training;Neuromuscular re-education;Gait training;Manual techniques;Dry needling    PT Next Visit Plan  continue progressing ROM, flexibility and strengthening as tolerated.     Consulted and Agree with Plan of Care  Patient       Patient will benefit from skilled therapeutic intervention in order to improve the following deficits and impairments:  Abnormal gait, Improper body mechanics, Postural dysfunction, Pain, Increased fascial restricitons, Increased muscle spasms, Hypomobility, Decreased mobility, Decreased range of motion, Decreased activity tolerance, Decreased endurance  Visit Diagnosis: 1. Pain in left hip   2. Pain in right hip   3. Other symptoms and signs involving the musculoskeletal system   4. Other abnormalities of gait and mobility        Problem List Patient Active Problem List   Diagnosis Date Noted  . On home oxygen therapy 11/23/2018  . Environmental allergies 11/23/2018  . White coat syndrome with diagnosis of hypertension 11/23/2018  . COPD (chronic obstructive pulmonary disease) ([place] 11/03/2018  . Hypertension goal BP (blood pressure) < 140/90 11/03/2018  . Familial hypercholanemia 11/03/2018  . HLD (hyperlipidemia) 11/03/2018  . Vitamin D deficiency 11/03/2018  . Hypothyroid 11/03/2018  . [place] W[place] disease ([place] possible 11/03/2018  . Elevated uric acid in blood 11/03/2018  . [place]ular [place]sema ([place] 11/03/2018  .  Hypoxia 11/03/2018  . Elevated troponin 09/18/2016      SLaureen Abrahams PT, DPT 03/04/19 11:19  [place], [place], [address] Phone: [phone]   Fax:  [phone]  Name: Kalasia Crafton MRN: [id number] Date of Birth: 02-Dec-1945

## 2019-03-07 ENCOUNTER — Encounter: Payer: Self-pay | Admitting: Rehabilitative and Restorative Service Providers"

## 2019-03-07 ENCOUNTER — Ambulatory Visit (INDEPENDENT_AMBULATORY_CARE_PROVIDER_SITE_OTHER): Payer: Medicare HMO | Admitting: Rehabilitative and Restorative Service Providers"

## 2019-03-07 ENCOUNTER — Other Ambulatory Visit: Payer: Self-pay

## 2019-03-07 DIAGNOSIS — R29898 Other symptoms and signs involving the musculoskeletal system: Secondary | ICD-10-CM | POA: Diagnosis not present

## 2019-03-07 DIAGNOSIS — R2689 Other abnormalities of gait and mobility: Secondary | ICD-10-CM | POA: Diagnosis not present

## 2019-03-07 DIAGNOSIS — M25551 Pain in right hip: Secondary | ICD-10-CM | POA: Diagnosis not present

## 2019-03-07 DIAGNOSIS — M25552 Pain in left hip: Secondary | ICD-10-CM | POA: Diagnosis not present

## 2019-03-07 NOTE — Therapy (Signed)
[place], [place], [address] Phone: [phone]   Fax:  [phone]  Physical Therapy Treatment  Patient Details  Name: Jennifer [name] MRN: [id number] Date of Birth: [birth year] Referring Provider (PT): Dr Georgina Snell    Encounter Date: 03/07/2019  PT End of Session - 03/07/19 1102    Visit Number  9    Number of Visits  12    Date for PT Re-Evaluation  03/22/19    PT Start Time  1101    PT Stop Time  1150   MH end of treatment   PT Time Calculation (min)  49 min    Activity Tolerance  Patient tolerated treatment well       Past Medical History:  Diagnosis Date  . COPD (chronic obstructive pulmonary disease) ([place]) 11/03/2018  . Familial hypercholanemia 11/03/2018  . HLD (hyperlipidemia) 11/03/2018  . HTN (hypertension) 11/03/2018  . Hypothyroid 11/03/2018  . Vitamin D deficiency 11/03/2018  . Von Willebrand disease ([place]) possible 11/03/2018   Seen in documentation from prior provider    Past Surgical History:  Procedure Laterality Date  . APPENDECTOMY  1978  . BIOPSY ENDOMETRIAL    . CESAREAN SECTION     triplets  . TUBAL LIGATION      There were no vitals filed for this visit.  Subjective Assessment - 03/07/19 1104    Subjective  hips are "better"  - needling and exercises have taken the worst of the pain out of the legs - still has the deep pain in the joints    Currently in Pain?  Yes    Pain Score  1     Pain Location  Hip    Pain Orientation  Left;Right    Pain Descriptors / Indicators  Dull;Aching;Burning    Pain Type  Chronic pain                       [facility] Adult PT Treatment/Exercise - 03/07/19 0001      Knee/Hip Exercises: Stretches   Hip Flexor Stretch  Right;Left;3 reps;30 seconds   lying on back one knee bent opposite knee outstretched      Knee/Hip Exercises: Aerobic   Nustep  L5 x 10 min      Knee/Hip Exercises: Standing   Hip Extension   Right;Left;2 sets;5 reps;Knee straight   using cane Rt UE when wt Lt for safety as needed    Extension Limitations  supporting UE on wall       Knee/Hip Exercises: Seated   Sit to Sand  1 set;without UE support;5 reps      Knee/Hip Exercises: Supine   Bridges  Strengthening;2 sets;5 reps   encouraging core activation tightening abdominals      Moist Heat Therapy   Number Minutes Moist Heat  10 Minutes    Moist Heat Location  Hip   anterior hips bilat      Manual Therapy   Manual therapy comments  pt supine one knee flexed opposite knee extended - working on extended hip with manual techniques     Joint Mobilization  posterior glide hip - pressure to pt tolerance    Soft tissue mobilization  deep tissue work Lt /Rt quads; hip flexors; psoas; adductors                   PT Long Term Goals - 02/08/19 1148      PT LONG TERM GOAL #1  Title  Improve standing posture and alignment with patient to demonstrate neutral hip extension bilat 03/22/2019    Time  6    Period  Weeks    Status  Revised      PT LONG TERM GOAL #2   Title  Patient demonstrate improved gait pattern with least assistive device and safe gait pattern and minimal to no pain 03/22/2019    Time  6    Period  Weeks    Status  Revised      PT LONG TERM GOAL #3   Title  Independent in all transfers with good body mechanics 03/22/2019    Time  6    Period  Weeks    Status  Revised      PT LONG TERM GOAL #4   Title  Independent in HEP 03/22/2019    Time  6    Period  Weeks    Status  Revised      PT LONG TERM GOAL #5   Title  Improve FOTO to </= 37% limitation 03/22/2019    Time  6    Period  Weeks    Status  Revised            Plan - 03/07/19 1103    Clinical Impression Statement  Patient reports continued gradual progress. She is working on the exercises at home - even if they hurt. She continues to have muscular tightness through the hip flexors. Responds well toDN/deep tissue work.    Rehab  Potential  Good    PT Frequency  2x / week    PT Duration  6 weeks    PT Treatment/Interventions  Patient/family education;ADLs/Self Care Home Management;Cryotherapy;Electrical Stimulation;Iontophoresis 4mg /ml Dexamethasone;Moist Heat;Ultrasound;Therapeutic activities;Therapeutic exercise;Balance training;Neuromuscular re-education;Gait training;Manual techniques;Dry needling    PT Next Visit Plan  continue progressing ROM, flexibility and strengthening as tolerated; DN.STM anterior chain; FOTO and 10 th visit note at next visit  - anticipate renewal of POC 03/22/2019    Consulted and Agree with Plan of Care  Patient       Patient will benefit from skilled therapeutic intervention in order to improve the following deficits and impairments:  Abnormal gait, Improper body mechanics, Postural dysfunction, Pain, Increased fascial restricitons, Increased muscle spasms, Hypomobility, Decreased mobility, Decreased range of motion, Decreased activity tolerance, Decreased endurance  Visit Diagnosis: 1. Pain in left hip   2. Pain in right hip   3. Other symptoms and signs involving the musculoskeletal system   4. Other abnormalities of gait and mobility        Problem List Patient Active Problem List   Diagnosis Date Noted  . On home oxygen therapy 11/23/2018  . Environmental allergies 11/23/2018  . White coat syndrome with diagnosis of hypertension 11/23/2018  . COPD (chronic obstructive pulmonary disease) ([facility]) 11/03/2018  . Hypertension goal BP (blood pressure) < 140/90 11/03/2018  . Familial hypercholanemia 11/03/2018  . HLD (hyperlipidemia) 11/03/2018  . Vitamin D deficiency 11/03/2018  . Hypothyroid 11/03/2018  . Von Willebrand disease ([facility]) possible 11/03/2018  . Elevated uric acid in blood 11/03/2018  . Centrilobular emphysema ([facility]) 11/03/2018  . Hypoxia 11/03/2018  . Elevated troponin 09/18/2016    Iann Rodier Rober MinionP Issai Werling PT, MPH  03/07/2019, 11:58 AM  [facility] [address] [place], [place], [address] Phone: [phone]   Fax:  [phone]  Name: Jennifer [name] MRN: [id number] Date of Birth: August 18, 1946

## 2019-03-09 ENCOUNTER — Ambulatory Visit (INDEPENDENT_AMBULATORY_CARE_PROVIDER_SITE_OTHER): Payer: Medicare HMO | Admitting: Physical Therapy

## 2019-03-09 ENCOUNTER — Other Ambulatory Visit: Payer: Self-pay

## 2019-03-09 DIAGNOSIS — M25552 Pain in left hip: Secondary | ICD-10-CM | POA: Diagnosis not present

## 2019-03-09 DIAGNOSIS — R29898 Other symptoms and signs involving the musculoskeletal system: Secondary | ICD-10-CM | POA: Diagnosis not present

## 2019-03-09 DIAGNOSIS — M25551 Pain in right hip: Secondary | ICD-10-CM | POA: Diagnosis not present

## 2019-03-09 NOTE — Therapy (Signed)
[place], [place], [address] Phone: [phone]   Fax:  [phone]  Physical Therapy Treatment  Progress Note Reporting Period 11/18/2018 to 03/09/2019  See note below for Objective Data and Assessment of Progress/Goals.       Patient Details  Name: Jennifer [name] MRN: [id number] Date of Birth: 1946/03/31 Referring Provider (PT): Dr Georgina Snell    Encounter Date: 03/09/2019  PT End of Session - 03/09/19 1409    Visit Number  10    Number of Visits  12    Date for PT Re-Evaluation  03/22/19    PT Start Time  1404    PT Stop Time  1445    PT Time Calculation (min)  41 min    Activity Tolerance  Patient tolerated treatment well    Behavior During Therapy  [facility] for tasks assessed/performed       Past Medical History:  Diagnosis Date  . COPD (chronic obstructive pulmonary disease) ([place]) 11/03/2018  . Familial hypercholanemia 11/03/2018  . HLD (hyperlipidemia) 11/03/2018  . HTN (hypertension) 11/03/2018  . Hypothyroid 11/03/2018  . Vitamin D deficiency 11/03/2018  . Von Willebrand disease ([place]) possible 11/03/2018   Seen in documentation from prior provider    Past Surgical History:  Procedure Laterality Date  . APPENDECTOMY  1978  . BIOPSY ENDOMETRIAL    . CESAREAN SECTION     triplets  . TUBAL LIGATION      Subjective Assessment - 03/09/19 1408    Subjective  Pt reports she continues to do exercises at home, even though they hurt. "I'm forcing myself to stand up straight. "    Pertinent History  asthma; copd; HTN    Patient Stated Goals  get off the cane; get in and out of the bed more easily; get up and down easily     Currently in Pain?  Yes    Pain Score  2     Pain Location  Hip    Pain Orientation  Right;Left    Pain Descriptors / Indicators  Aching;Dull    Aggravating Factors   getting up from recliner    Pain Relieving Factors  massage, medicine         [facility] PT Assessment -  03/09/19 0001      Assessment   Medical Diagnosis  Bilat hip pain Lt > Rt     Referring Provider (PT)  Dr Georgina Snell     Onset Date/Surgical Date  06/08/18    Hand Dominance  Right      Observation/Other Assessments   Focus on Therapeutic Outcomes (FOTO)   65% limited; however she is reporting 50% improvement since initiating therapy.         [facility] Adult PT Treatment/Exercise - 03/09/19 0001      Knee/Hip Exercises: Stretches   Hip Flexor Stretch  Right;Left;30 seconds;1 rep   lying on back one knee bent opposite knee outstretched      Knee/Hip Exercises: Aerobic   Nustep  L4: (arms/legs) x 7 min       Knee/Hip Exercises: Standing   Hip Extension  AROM;Right;Left;1 set;5 reps    Extension Limitations  UE support on cane and NuStep handle.     Other Standing Knee Exercises  standing trunk ext, leaning hips into counter x 8 reps    Other Standing Knee Exercises  standing hip mobility exercise (painting name on floor with imaginary horse tail) x 2 min;  side to side gentle  lunges with minimal knee bend x 10;  staggered stance with wt shift front to back (focus on hip ext of back leg- simulating toe off with upright posture) x 5 reps each side       Knee/Hip Exercises: Supine   Bridges  1 set;5 reps      Modalities   Modalities  --   pt declined; will do at home.      Manual Therapy   Manual therapy comments  pt in supported supine     Joint Mobilization  gentle anterior hip mobs     Soft tissue mobilization  TPR to Rt/ Lt iliopsoas     Myofascial Release  Lt/Rt lateral quad     Passive ROM  Lt/ Rt circumdution of LE with knee flexed (very guarded with LLE); gentle rocking IR/ER with legs in supported hooklying        PT Long Term Goals - 03/09/19 1455      PT LONG TERM GOAL #1   Title  Improve standing posture and alignment with patient to demonstrate neutral hip extension bilat 03/22/2019    Baseline  improving - will measure at future visit.    Time  6    Period  Weeks     Status  Revised      PT LONG TERM GOAL #2   Title  Patient demonstrate improved gait pattern with least assistive device and safe gait pattern and minimal to no pain 03/22/2019    Time  6    Period  Weeks    Status  Partially Met      PT LONG TERM GOAL #3   Title  Independent in all transfers with good body mechanics 03/22/2019    Time  6    Period  Weeks    Status  Partially Met      PT LONG TERM GOAL #4   Title  Independent in HEP 03/22/2019    Time  6    Period  Weeks    Status  On-going      PT LONG TERM GOAL #5   Title  Improve FOTO to </= 37% limitation 03/22/2019    Time  6    Period  Weeks    Status  On-going            Plan - 03/09/19 1456    Clinical Impression Statement  Pt's FOTO score remains similar to intake, however pt reporting 50% improvement in mobility and pain since intiating therapy.  Pt continues with tightness through bilat hip flexors (L>R). Able to tolerate short bursts of standing and supine exercises. Pt making gradual gains towards goals.  Pt has partially met her goals and will benefit from continued PT intervention to maximize functional mobility.    Examination-Activity Limitations  Squat;Bed Mobility;Stand;Locomotion Level    Examination-Participation Restrictions  Community Activity    Rehab Potential  Good    PT Frequency  2x / week    PT Duration  6 weeks    PT Treatment/Interventions  Patient/family education;ADLs/Self Care Home Management;Cryotherapy;Electrical Stimulation;Iontophoresis 50m/ml Dexamethasone;Moist Heat;Ultrasound;Therapeutic activities;Therapeutic exercise;Balance training;Neuromuscular re-education;Gait training;Manual techniques;Dry needling    PT Next Visit Plan  continue progressing ROM, flexibility and strengthening as tolerated; DN.STM anterior chain;per supervising PT, anticipate renewal of POC 03/22/2019.       Patient will benefit from skilled therapeutic intervention in order to improve the following deficits and  impairments:  Abnormal gait, Improper body mechanics, Postural dysfunction, Pain, Increased fascial restricitons, Increased muscle  spasms, Hypomobility, Decreased mobility, Decreased range of motion, Decreased activity tolerance, Decreased endurance  Visit Diagnosis: 1. Pain in left hip   2. Pain in right hip   3. Other symptoms and signs involving the musculoskeletal system        Problem List Patient Active Problem List   Diagnosis Date Noted  . On home oxygen therapy 11/23/2018  . Environmental allergies 11/23/2018  . White coat syndrome with diagnosis of hypertension 11/23/2018  . COPD (chronic obstructive pulmonary disease) ([place]) 11/03/2018  . Hypertension goal BP (blood pressure) < 140/90 11/03/2018  . Familial hypercholanemia 11/03/2018  . HLD (hyperlipidemia) 11/03/2018  . Vitamin D deficiency 11/03/2018  . Hypothyroid 11/03/2018  . Von Willebrand disease ([place]) possible 11/03/2018  . Elevated uric acid in blood 11/03/2018  . Centrilobular emphysema ([place]) 11/03/2018  . Hypoxia 11/03/2018  . Elevated troponin 09/18/2016   Kerin Perna, PTA 03/09/19 3:16 PM  Celyn P. Helene Kelp PT, MPH 03/09/19 5:10 PM   [facility] [place], [place], [address] Phone: [phone]   Fax:  [phone]  Name: Jennifer [name] MRN: [id number] Date of Birth: 05/05/1946

## 2019-03-14 ENCOUNTER — Ambulatory Visit (INDEPENDENT_AMBULATORY_CARE_PROVIDER_SITE_OTHER): Payer: Medicare HMO | Admitting: Physical Therapy

## 2019-03-14 ENCOUNTER — Other Ambulatory Visit: Payer: Self-pay

## 2019-03-14 DIAGNOSIS — M25551 Pain in right hip: Secondary | ICD-10-CM

## 2019-03-14 DIAGNOSIS — M25552 Pain in left hip: Secondary | ICD-10-CM | POA: Diagnosis not present

## 2019-03-14 DIAGNOSIS — R29898 Other symptoms and signs involving the musculoskeletal system: Secondary | ICD-10-CM

## 2019-03-14 NOTE — Therapy (Signed)
[place], [place], [address] Phone: [phone]   Fax:  [phone]  Physical Therapy Treatment  Patient Details  Name: Jennifer [name] MRN: [id number] Date of Birth: 03-Aug-1946 Referring Provider (PT): Dr Georgina Snell    Encounter Date: 03/14/2019  PT End of Session - 03/14/19 1014    Visit Number  11    Number of Visits  12    Date for PT Re-Evaluation  03/22/19    PT Start Time  1005    PT Stop Time  1050    PT Time Calculation (min)  45 min    Activity Tolerance  Patient tolerated treatment well    Behavior During Therapy  [facility] for tasks assessed/performed       Past Medical History:  Diagnosis Date  . COPD (chronic obstructive pulmonary disease) ([place]) 11/03/2018  . Familial hypercholanemia 11/03/2018  . HLD (hyperlipidemia) 11/03/2018  . HTN (hypertension) 11/03/2018  . Hypothyroid 11/03/2018  . Vitamin D deficiency 11/03/2018  . Von Willebrand disease ([place]) possible 11/03/2018   Seen in documentation from prior provider    Past Surgical History:  Procedure Laterality Date  . APPENDECTOMY  1978  . BIOPSY ENDOMETRIAL    . CESAREAN SECTION     triplets  . TUBAL LIGATION      There were no vitals filed for this visit.  Subjective Assessment - 03/14/19 1015    Subjective  Pt reports she did well after last session.  She did a lot of standing to cook for her kids trip to beach, but didn't do too bad afterwards. Today she complains of pain in her Lt thigh, "I had parked too close to another car and had to bend my leg in a way I hadn't in a long time, to get it into the car".    Patient Stated Goals  get off the cane; get in and out of the bed more easily; get up and down easily     Currently in Pain?  Yes    Pain Score  2     Pain Location  Hip    Pain Orientation  Left    Pain Descriptors / Indicators  Aching;Tightness    Aggravating Factors   getting up from low surface    Pain Relieving  Factors  massage, medicine         [facility] PT Assessment - 03/14/19 0001      Assessment   Medical Diagnosis  Bilat hip pain Lt > Rt     Referring Provider (PT)  Dr Georgina Snell     Onset Date/Surgical Date  06/08/18    Hand Dominance  Right      Strength   Left Hip Flexion  3+/5   painful - gives d/t pain      [facility] Adult PT Treatment/Exercise - 03/14/19 0001      Knee/Hip Exercises: Stretches   Hip Flexor Stretch  Right;Left;30 seconds;1 rep   lying on back one knee bent opposite knee outstretched    Other Knee/Hip Stretches  trial of seated fig 4; added strap assist with LLE (unable to complete) - will try in supine in future visit.       Knee/Hip Exercises: Aerobic   Nustep  L4: (arms/legs) x 7 min       Knee/Hip Exercises: Standing   Side Lunges  Right;Left;1 set;10 reps   gentle rocking back and forth into lunge; UE on counter   Hip Abduction  Stengthening;1  set;10 reps    Hip Extension  AROM;Right;Left;1 set   8 reps   Extension Limitations  support on cane and counter    Forward Step Up  Right;Left   2 reps; SBA for safety. UE support on counter/cane   Other Standing Knee Exercises  standing trunk ext, leaning hips into counter x 8 reps      Knee/Hip Exercises: Supine   Heel Slides  --      Modalities   Modalities  --   pt declined; will do at home.      Manual Therapy   Manual therapy comments  pt in supported supine     Joint Mobilization  gentle anterior hip mobs     Soft tissue mobilization  STM to Lt / Rt quad, ITB, glute     Myofascial Release  Lt/Rt lateral quad     Passive ROM  Lt leg circumduction (pt resistant/guarded)          PT Long Term Goals - 03/09/19 1455      PT LONG TERM GOAL #1   Title  Improve standing posture and alignment with patient to demonstrate neutral hip extension bilat 03/22/2019    Baseline  improving - will measure at future visit.    Time  6    Period  Weeks    Status  Revised      PT LONG TERM GOAL #2   Title  Patient  demonstrate improved gait pattern with least assistive device and safe gait pattern and minimal to no pain 03/22/2019    Time  6    Period  Weeks    Status  Partially Met      PT LONG TERM GOAL #3   Title  Independent in all transfers with good body mechanics 03/22/2019    Time  6    Period  Weeks    Status  Partially Met      PT LONG TERM GOAL #4   Title  Independent in HEP 03/22/2019    Time  6    Period  Weeks    Status  On-going      PT LONG TERM GOAL #5   Title  Improve FOTO to </= 37% limitation 03/22/2019    Time  6    Period  Weeks    Status  On-going            Plan - 03/14/19 1244    Clinical Impression Statement  Pt's exercise tolerance is improving each visit.  Overall pain report is less. She will benefit from continued PT intervention to improve mobility and safety.    Examination-Activity Limitations  Squat;Bed Mobility;Stand;Locomotion Level    Examination-Participation Restrictions  Community Activity    Rehab Potential  Good    PT Frequency  2x / week    PT Duration  6 weeks    PT Treatment/Interventions  Patient/family education;ADLs/Self Care Home Management;Cryotherapy;Electrical Stimulation;Iontophoresis 71m/ml Dexamethasone;Moist Heat;Ultrasound;Therapeutic activities;Therapeutic exercise;Balance training;Neuromuscular re-education;Gait training;Manual techniques;Dry needling    PT Next Visit Plan  assess goals. renewal vs hold (end of POC); DN to LLE.       Patient will benefit from skilled therapeutic intervention in order to improve the following deficits and impairments:  Abnormal gait, Improper body mechanics, Postural dysfunction, Pain, Increased fascial restricitons, Increased muscle spasms, Hypomobility, Decreased mobility, Decreased range of motion, Decreased activity tolerance, Decreased endurance  Visit Diagnosis: 1. Pain in left hip   2. Pain in right hip   3. Other  symptoms and signs involving the musculoskeletal system         Problem List Patient Active Problem List   Diagnosis Date Noted  . On home oxygen therapy 11/23/2018  . Environmental allergies 11/23/2018  . White coat syndrome with diagnosis of hypertension 11/23/2018  . COPD (chronic obstructive pulmonary disease) ([place]) 11/03/2018  . Hypertension goal BP (blood pressure) < 140/90 11/03/2018  . Familial hypercholanemia 11/03/2018  . HLD (hyperlipidemia) 11/03/2018  . Vitamin D deficiency 11/03/2018  . Hypothyroid 11/03/2018  . Von Willebrand disease ([place]) possible 11/03/2018  . Elevated uric acid in blood 11/03/2018  . Centrilobular emphysema ([place]) 11/03/2018  . Hypoxia 11/03/2018  . Elevated troponin 09/18/2016   Kerin Perna, PTA 03/14/19 12:54 PM   [place], [place], [address] Phone: [phone]   Fax:  [phone]  Name: Arlone Lenhardt MRN: [id number] Date of Birth: 05-12-1946

## 2019-03-17 ENCOUNTER — Encounter: Payer: Medicare HMO | Admitting: Physical Therapy

## 2019-03-21 ENCOUNTER — Other Ambulatory Visit: Payer: Self-pay

## 2019-03-21 ENCOUNTER — Encounter: Payer: Medicare HMO | Admitting: Rehabilitative and Restorative Service Providers"

## 2019-03-21 ENCOUNTER — Ambulatory Visit (INDEPENDENT_AMBULATORY_CARE_PROVIDER_SITE_OTHER): Payer: Medicare HMO | Admitting: Rehabilitative and Restorative Service Providers"

## 2019-03-21 ENCOUNTER — Encounter: Payer: Self-pay | Admitting: Rehabilitative and Restorative Service Providers"

## 2019-03-21 DIAGNOSIS — M25552 Pain in left hip: Secondary | ICD-10-CM

## 2019-03-21 DIAGNOSIS — M25551 Pain in right hip: Secondary | ICD-10-CM | POA: Diagnosis not present

## 2019-03-21 DIAGNOSIS — R29898 Other symptoms and signs involving the musculoskeletal system: Secondary | ICD-10-CM | POA: Diagnosis not present

## 2019-03-21 DIAGNOSIS — R2689 Other abnormalities of gait and mobility: Secondary | ICD-10-CM | POA: Diagnosis not present

## 2019-03-21 NOTE — Therapy (Signed)
[facility] [address] [place], [place], [address] Phone: [phone]   Fax:  [phone]  Physical Therapy Treatment  Patient Details  Name: Jennifer KernsSheryl [name] MRN: [id number] Date of Birth: 1946-03-26 Referring Provider (PT): Dr Denyse Amassorey    Encounter Date: 03/21/2019  PT End of Session - 03/21/19 1416    Visit Number  12    Number of Visits  24    Date for PT Re-Evaluation  05/03/19    PT Start Time  1612   pt 12 min late for appt   PT Stop Time  1650    PT Time Calculation (min)  38 min    Activity Tolerance  Patient tolerated treatment well       Past Medical History:  Diagnosis Date  . COPD (chronic obstructive pulmonary disease) (HCC) 11/03/2018  . Familial hypercholanemia 11/03/2018  . HLD (hyperlipidemia) 11/03/2018  . HTN (hypertension) 11/03/2018  . Hypothyroid 11/03/2018  . Vitamin D deficiency 11/03/2018  . Von Willebrand disease (HCC) possible 11/03/2018   Seen in documentation from prior provider    Past Surgical History:  Procedure Laterality Date  . APPENDECTOMY  1978  . BIOPSY ENDOMETRIAL    . CESAREAN SECTION     triplets  . TUBAL LIGATION      There were no vitals filed for this visit.  Subjective Assessment - 03/21/19 1417    Subjective  Patient reports that she was dog sitting for her son's 4 dogs over the last week. She reports that she has been stretching and doing her exercise. She has some pain after the exercises lasting several hours to days. She is going to schedule an appointment with Dr Denyse Amassorey.    Currently in Pain?  Yes    Pain Score  2     Pain Location  Hip    Pain Orientation  Left    Pain Descriptors / Indicators  Aching;Tightness    Pain Type  Chronic pain    Pain Location  Hip    Pain Orientation  Right    Pain Descriptors / Indicators  Aching;Tightness;Dull;Burning    Pain Type  Chronic pain         [facility] PT Assessment - 03/21/19 0001      Assessment   Medical  Diagnosis  Bilat hip pain Lt > Rt     Referring Provider (PT)  Dr Denyse Amassorey     Onset Date/Surgical Date  06/08/18    Hand Dominance  Right    Next MD Visit  not scheduled yet.     Prior Therapy  none       AROM   Right/Left Hip  --   hip extension estimated in standing    Right Hip Extension  -10    Left Hip Extension  -10      Strength   Right/Left Hip  --   assessed in supine - pt unable to lie prone for testing    Right Hip Flexion  4-/5    Right Hip Extension  4+/5    Right Hip ABduction  4/5    Right Hip ADduction  4/5    Left Hip Flexion  3+/5    Left Hip Extension  4-/5    Left Hip ABduction  4-/5    Left Hip ADduction  4-/5    Right Knee Flexion  5/5    Right Knee Extension  5/5    Left Knee Flexion  5/5    Left  Knee Extension  5/5      Flexibility   Hamstrings  tight bilat Lt > Rt     ITB  tight bilat Lt > Rt     Piriformis  extremely tight Lt > Rt       Palpation   Palpation comment  significant muscular tightness Lt > Rt hips anterior/lateral hips       Ambulation/Gait   Gait Comments  ambulates with wide based gait pattern; hips and knees flexed; LE's in ER Lt > Rt - cane Rt UP - flexed forward at hips                    [facility] Adult PT Treatment/Exercise - 03/21/19 0001      Knee/Hip Exercises: Stretches   Hip Flexor Stretch  Right;Left;30 seconds;1 rep   lying on back one knee bent opposite knee outstretched      Knee/Hip Exercises: Aerobic   Nustep  L5: (legs) x 7 min       Knee/Hip Exercises: Supine   Quad Sets  Strengthening;Right;Left;1 set;5 reps    Other Supine Knee/Hip Exercises  glut set 5 sec x 10 each LE opposite knee bent       Manual Therapy   Manual therapy comments  supine hooklying LE's supported on bolster     Joint Mobilization  anterior-posterior hip mobs Grade II    Soft tissue mobilization  STM to Lt / Rt quad, ITB    Myofascial Release  Lt/Rt lateral quad                   PT Long Term Goals - 03/21/19  1420      PT LONG TERM GOAL #1   Title  Improve standing posture and alignment with patient to demonstrate neutral hip extension bilat 05/03/2019    Time  12    Period  Weeks    Status  Revised      PT LONG TERM GOAL #2   Title  Patient demonstrate improved gait pattern with least assistive device and safe gait pattern and minimal to no pain 8/252020    Time  12    Period  Weeks    Status  Revised      PT LONG TERM GOAL #3   Title  Independent in all transfers with good body mechanics 05/03/2019    Time  12    Period  Weeks    Status  Revised      PT LONG TERM GOAL #4   Title  Independent in HEP 05/03/2019    Time  12    Period  Weeks    Status  Revised      PT LONG TERM GOAL #5   Title  Improve FOTO to </= 37% limitation 05/03/2019    Time  12    Period  Weeks    Status  Revised            Plan - 03/21/19 1417    Clinical Impression Statement  Patient reports increase in functional activity level and ambulation. Pain persists at times worse than others. She has poor posture and alignment in standing; abnormla gait pattern; limited ORM and tissue extensibility. She demonstrates increased strength; mobility and some improvement in gait pattern. Pain continues to limit progress. Patient will benefit from PT to address problems identified and progress to maximum rehab potential.    Examination-Activity Limitations  Squat;Bed Mobility;Stand;Locomotion Level    Examination-Participation Restrictions  Community Activity    Rehab Potential  Good    PT Frequency  2x / week    PT Duration  6 weeks    PT Treatment/Interventions  Patient/family education;ADLs/Self Care Home Management;Cryotherapy;Electrical Stimulation;Iontophoresis 4mg /ml Dexamethasone;Moist Heat;Ultrasound;Therapeutic activities;Therapeutic exercise;Balance training;Neuromuscular re-education;Gait training;Manual techniques;Dry needling    PT Next Visit Plan  continue stretching; postural correction; strengthening;  functional activities;  DN as indicated    Consulted and Agree with Plan of Care  Patient       Patient will benefit from skilled therapeutic intervention in order to improve the following deficits and impairments:  Abnormal gait, Improper body mechanics, Postural dysfunction, Pain, Increased fascial restricitons, Increased muscle spasms, Hypomobility, Decreased mobility, Decreased range of motion, Decreased activity tolerance, Decreased endurance  Visit Diagnosis: 1. Pain in left hip   2. Pain in right hip   3. Other symptoms and signs involving the musculoskeletal system   4. Other abnormalities of gait and mobility        Problem List Patient Active Problem List   Diagnosis Date Noted  . On home oxygen therapy 11/23/2018  . Environmental allergies 11/23/2018  . White coat syndrome with diagnosis of hypertension 11/23/2018  . COPD (chronic obstructive pulmonary disease) (HCC) 11/03/2018  . Hypertension goal BP (blood pressure) < 140/90 11/03/2018  . Familial hypercholanemia 11/03/2018  . HLD (hyperlipidemia) 11/03/2018  . Vitamin D deficiency 11/03/2018  . Hypothyroid 11/03/2018  . Von Willebrand disease (HCC) possible 11/03/2018  . Elevated uric acid in blood 11/03/2018  . Centrilobular emphysema (HCC) 11/03/2018  . Hypoxia 11/03/2018  . Elevated troponin 09/18/2016    Malory Spurr Rober MinionP Sharnell Knight PT, MPH  03/21/2019, 2:58 PM  [facility] [address] [place], [place], [address] Phone: [phone]   Fax:  [phone]  Name: Jennifer KernsSheryl [name] MRN: [id number] Date of Birth: [birth year]

## 2019-03-24 ENCOUNTER — Ambulatory Visit (INDEPENDENT_AMBULATORY_CARE_PROVIDER_SITE_OTHER): Payer: Medicare HMO | Admitting: Physical Therapy

## 2019-03-24 ENCOUNTER — Other Ambulatory Visit: Payer: Self-pay

## 2019-03-24 ENCOUNTER — Encounter: Payer: Medicare HMO | Admitting: Physical Therapy

## 2019-03-24 DIAGNOSIS — R2689 Other abnormalities of gait and mobility: Secondary | ICD-10-CM | POA: Diagnosis not present

## 2019-03-24 DIAGNOSIS — M25551 Pain in right hip: Secondary | ICD-10-CM

## 2019-03-24 DIAGNOSIS — R29898 Other symptoms and signs involving the musculoskeletal system: Secondary | ICD-10-CM

## 2019-03-24 DIAGNOSIS — M25552 Pain in left hip: Secondary | ICD-10-CM | POA: Diagnosis not present

## 2019-03-24 NOTE — Therapy (Signed)
[facility] [place], [place], [address] Phone: [phone]   Fax:  [phone]  Physical Therapy Treatment  Patient Details  Name: Jennifer [name] MRN: [id number] Date of Birth: 1946/08/15 Referring Provider (PT): Dr Georgina Snell    Encounter Date: 03/24/2019  PT End of Session - 03/24/19 1510    Visit Number  13    Number of Visits  24    Date for PT Re-Evaluation  05/03/19    PT Start Time  1501    PT Stop Time  1545    PT Time Calculation (min)  44 min    Activity Tolerance  Patient limited by pain    Behavior During Therapy  [facility] for tasks assessed/performed       Past Medical History:  Diagnosis Date  . COPD (chronic obstructive pulmonary disease) ([place]) 11/03/2018  . Familial hypercholanemia 11/03/2018  . HLD (hyperlipidemia) 11/03/2018  . HTN (hypertension) 11/03/2018  . Hypothyroid 11/03/2018  . Vitamin D deficiency 11/03/2018  . Von Willebrand disease ([place]) possible 11/03/2018   Seen in documentation from prior provider    Past Surgical History:  Procedure Laterality Date  . APPENDECTOMY  1978  . BIOPSY ENDOMETRIAL    . CESAREAN SECTION     triplets  . TUBAL LIGATION      There were no vitals filed for this visit.  Subjective Assessment - 03/24/19 1506    Subjective  Pt reports she is very sore in Rt ant hip.  She was doing exercises at counter when she felt a pop in Rt hip. Slept in bed for first time last night, and almost rolled over. "it's not the best day today"   Pertinent History  asthma; copd; HTN    Patient Stated Goals  get off the cane; get in and out of the bed more easily; get up and down easily     Currently in Pain?  Yes    Pain Score  4     Pain Location  Hip    Pain Orientation  Right    Pain Descriptors / Indicators  Aching;Tightness;Sore    Aggravating Factors   getting up from low surface    Pain Relieving Factors  massage, medicine    Pain Score  2    Pain Location  Hip     Pain Orientation  Left    Pain Descriptors / Indicators  Aching    Aggravating Factors   see above    Pain Relieving Factors  see above         [facility] PT Assessment - 03/24/19 0001      Assessment   Medical Diagnosis  Bilat hip pain Lt > Rt     Referring Provider (PT)  Dr Georgina Snell     Onset Date/Surgical Date  06/08/18    Hand Dominance  Right    Next MD Visit  not scheduled yet.     Prior Therapy  none        [facility] Adult PT Treatment/Exercise - 03/24/19 0001      Knee/Hip Exercises: Stretches   Hip Flexor Stretch  Right;Left;1 rep;30 seconds   seated, limited tolerance     Knee/Hip Exercises: Aerobic   Nustep  --   unavailable at start; pt declined use at end of session      Knee/Hip Exercises: Standing   Side Lunges  Right;Left;1 set;10 reps   gentle rocking back and forth into lunge; UE on counter   Hip  Extension  AROM;Right;Left;1 set   8 reps   Gait Training  50 ft with RW, with cues for upright posture.     Other Standing Knee Exercises  standing trunk ext, leaning hips into counter x 10 reps    Other Standing Knee Exercises  standing hip rotations (like hula hooping) x 10 reps       Manual Therapy   Manual therapy comments  supine hooklying LE's supported on bolster     Soft tissue mobilization  STM to Lt / Rt quad, ITB; TPR to hip rotators with active ER/IR of LE     Myofascial Release  Lt/Rt lateral quad     Passive ROM  Lt/Rt leg circumduction (pt resistant/guarded on LLE); Lt hip add/abdct, Lt hip ER (guarded); Rt hip ER to neutral.                   PT Long Term Goals - 03/21/19 1420      PT LONG TERM GOAL #1   Title  Improve standing posture and alignment with patient to demonstrate neutral hip extension bilat 05/03/2019    Time  12    Period  Weeks    Status  Revised      PT LONG TERM GOAL #2   Title  Patient demonstrate improved gait pattern with least assistive device and safe gait pattern and minimal to no pain 8/252020    Time  12     Period  Weeks    Status  Revised      PT LONG TERM GOAL #3   Title  Independent in all transfers with good body mechanics 05/03/2019    Time  12    Period  Weeks    Status  Revised      PT LONG TERM GOAL #4   Title  Independent in HEP 05/03/2019    Time  12    Period  Weeks    Status  Revised      PT LONG TERM GOAL #5   Title  Improve FOTO to </= 37% limitation 05/03/2019    Time  12    Period  Weeks    Status  Revised            Plan - 03/24/19 1552    Clinical Impression Statement  Pt reporting flare up of symptoms in hips.  Pt continues with limited hip ROM and limited exercise tolerance today due to increased pain.Pt plans to order a platform RW in hopes to help her gait and posture.  Goals are ongoing at this time.    Rehab Potential  Good    PT Frequency  2x / week    PT Duration  6 weeks    PT Treatment/Interventions  Patient/family education;ADLs/Self Care Home Management;Cryotherapy;Electrical Stimulation;Iontophoresis 4mg /ml Dexamethasone;Moist Heat;Ultrasound;Therapeutic activities;Therapeutic exercise;Balance training;Neuromuscular re-education;Gait training;Manual techniques;Dry needling    PT Next Visit Plan  continue stretching; postural correction; strengthening; functional activities;  DN as indicated    Consulted and Agree with Plan of Care  Patient       Patient will benefit from skilled therapeutic intervention in order to improve the following deficits and impairments:  Abnormal gait, Improper body mechanics, Postural dysfunction, Pain, Increased fascial restricitons, Increased muscle spasms, Hypomobility, Decreased mobility, Decreased range of motion, Decreased activity tolerance, Decreased endurance  Visit Diagnosis: No diagnosis found.     Problem List Patient Active Problem List   Diagnosis Date Noted  . On home oxygen therapy 11/23/2018  .  Environmental allergies 11/23/2018  . White coat syndrome with diagnosis of hypertension 11/23/2018  .  COPD (chronic obstructive pulmonary disease) (HCC) 11/03/2018  . Hypertension goal BP (blood pressure) < 140/90 11/03/2018  . Familial hypercholanemia 11/03/2018  . HLD (hyperlipidemia) 11/03/2018  . Vitamin D deficiency 11/03/2018  . Hypothyroid 11/03/2018  . Von Willebrand disease (HCC) possible 11/03/2018  . Elevated uric acid in blood 11/03/2018  . Centrilobular emphysema (HCC) 11/03/2018  . Hypoxia 11/03/2018  . Elevated troponin 09/18/2016   Mayer CamelJennifer Carlson-Long, PTA 03/24/19 5:24 PM  [facility] [address] [place], [place], [address] Phone: [phone]   Fax:  [phone]  Name: Jennifer [name] MRN: [id number] Date of Birth: [birth year]

## 2019-03-29 ENCOUNTER — Ambulatory Visit (INDEPENDENT_AMBULATORY_CARE_PROVIDER_SITE_OTHER): Payer: Medicare HMO | Admitting: Rehabilitative and Restorative Service Providers"

## 2019-03-29 ENCOUNTER — Encounter: Payer: Self-pay | Admitting: Rehabilitative and Restorative Service Providers"

## 2019-03-29 ENCOUNTER — Other Ambulatory Visit: Payer: Self-pay

## 2019-03-29 DIAGNOSIS — R2689 Other abnormalities of gait and mobility: Secondary | ICD-10-CM | POA: Diagnosis not present

## 2019-03-29 DIAGNOSIS — M25552 Pain in left hip: Secondary | ICD-10-CM | POA: Diagnosis not present

## 2019-03-29 DIAGNOSIS — R29898 Other symptoms and signs involving the musculoskeletal system: Secondary | ICD-10-CM | POA: Diagnosis not present

## 2019-03-29 DIAGNOSIS — M25551 Pain in right hip: Secondary | ICD-10-CM | POA: Diagnosis not present

## 2019-03-29 NOTE — Therapy (Signed)
[facility] [address] [place], [place], [address] Phone: [phone]   Fax:  [phone]  Physical Therapy Treatment  Patient Details  Name: Jennifer KernsSheryl [name] MRN: [id number] Date of Birth: 1946/08/30 Referring Provider (PT): Dr Denyse Amassorey    Encounter Date: 03/29/2019  PT End of Session - 03/29/19 1204    Visit Number  14    Number of Visits  24    Date for PT Re-Evaluation  05/03/19    PT Start Time  1202    PT Stop Time  1250    PT Time Calculation (min)  48 min    Activity Tolerance  Patient tolerated treatment well       Past Medical History:  Diagnosis Date  . COPD (chronic obstructive pulmonary disease) (HCC) 11/03/2018  . Familial hypercholanemia 11/03/2018  . HLD (hyperlipidemia) 11/03/2018  . HTN (hypertension) 11/03/2018  . Hypothyroid 11/03/2018  . Vitamin D deficiency 11/03/2018  . Von Willebrand disease (HCC) possible 11/03/2018   Seen in documentation from prior provider    Past Surgical History:  Procedure Laterality Date  . APPENDECTOMY  1978  . BIOPSY ENDOMETRIAL    . CESAREAN SECTION     triplets  . TUBAL LIGATION      There were no vitals filed for this visit.  Subjective Assessment - 03/29/19 1217    Subjective  Jennifer [name] reports she has slept in her bed for 3 nights in a row, "I know it helps stretch my legs out".  Pt has ordered a platform walker and hopes it will help with her gait. got some good spots with the DN today.    Pertinent History  asthma; COPR; HTN    Patient Stated Goals  get off the cane; get in and out of the bed more easily; get up and down easily     Currently in Pain?  Yes    Pain Score  3     Pain Location  Hip    Pain Orientation  Left;Right    Pain Descriptors / Indicators  Aching    Aggravating Factors   walking, getting up from low surface    Pain Relieving Factors  massage, medicine         [facility] - 03/29/19 0001      Assessment   Medical Diagnosis   Bilat hip pain Lt > Rt     Referring Provider (PT)  Dr Denyse Amassorey     Onset Date/Surgical Date  06/08/18    Hand Dominance  Right    Next MD Visit  not scheduled yet.     Prior Therapy  none                    [facility] Adult PT Treatment/Exercise - 03/29/19 0001      Knee/Hip Exercises: Aerobic   Nustep  L3: 6 min      Manual Therapy   Manual therapy comments  supine hooklying LE's supported on bolster     Soft tissue mobilization  Deep tissue work through bilat hip flexors and adductors, proximal quad    Myofascial Release  Lt/Rt proximal quad        Trigger Point Dry Needling - 03/29/19 0001    Consent Given?  Yes    Education Handout Provided  Previously provided    Dry Needling Comments  bilat     Adductor Response  Palpable increased muscle length;Twitch response elicited    Rectus femoris Response  Palpable increased muscle length;Twitch response elicited                PT Long Term Goals - 03/21/19 1420      PT LONG TERM GOAL #1   Title  Improve standing posture and alignment with patient to demonstrate neutral hip extension bilat 05/03/2019    Time  12    Period  Weeks    Status  Revised      PT LONG TERM GOAL #2   Title  Patient demonstrate improved gait pattern with least assistive device and safe gait pattern and minimal to no pain 8/252020    Time  12    Period  Weeks    Status  Revised      PT LONG TERM GOAL #3   Title  Independent in all transfers with good body mechanics 05/03/2019    Time  12    Period  Weeks    Status  Revised      PT LONG TERM GOAL #4   Title  Independent in HEP 05/03/2019    Time  12    Period  Weeks    Status  Revised      PT LONG TERM GOAL #5   Title  Improve FOTO to </= 37% limitation 05/03/2019    Time  12    Period  Weeks    Status  Revised            Plan - 03/29/19 1205    Clinical Impression Statement  Patient now sleeping in the bed and sleeping 9 hours/night. She has ordered a platform walker  to try. Patient responded well to DN bilat hips - flexors/adductors. Continued palpable tightness hip flexors and adductors. Abnormal gait pattern.    Rehab Potential  Good    PT Frequency  2x / week    PT Duration  6 weeks    PT Treatment/Interventions  Patient/family education;ADLs/Self Care Home Management;Cryotherapy;Electrical Stimulation;Iontophoresis 4mg /ml Dexamethasone;Moist Heat;Ultrasound;Therapeutic activities;Therapeutic exercise;Balance training;Neuromuscular re-education;Gait training;Manual techniques;Dry needling    PT Next Visit Plan  continue stretching; postural correction; strengthening; functional activities;  DN as indicated    Consulted and Agree with Plan of Care  Patient       Patient will benefit from skilled therapeutic intervention in order to improve the following deficits and impairments:  Abnormal gait, Improper body mechanics, Postural dysfunction, Pain, Increased fascial restricitons, Increased muscle spasms, Hypomobility, Decreased mobility, Decreased range of motion, Decreased activity tolerance, Decreased endurance  Visit Diagnosis: 1. Pain in left hip   2. Pain in right hip   3. Other symptoms and signs involving the musculoskeletal system   4. Other abnormalities of gait and mobility        Problem List Patient Active Problem List   Diagnosis Date Noted  . On home oxygen therapy 11/23/2018  . Environmental allergies 11/23/2018  . White coat syndrome with diagnosis of hypertension 11/23/2018  . COPD (chronic obstructive pulmonary disease) ([place]) 11/03/2018  . Hypertension goal BP (blood pressure) < 140/90 11/03/2018  . Familial hypercholanemia 11/03/2018  . HLD (hyperlipidemia) 11/03/2018  . Vitamin D deficiency 11/03/2018  . Hypothyroid 11/03/2018  . Von Willebrand disease ([place]) possible 11/03/2018  . Elevated uric acid in blood 11/03/2018  . Centrilobular emphysema ([place]) 11/03/2018  . Hypoxia 11/03/2018  . Elevated troponin 09/18/2016     Celyn Nilda Simmer PT, MPH  03/29/2019, 12:52 PM  [facility] [place], [place], [address]  Phone: [phone]   Fax:  [phone]  Name: Jennifer KernsSheryl Achey MRN: [id number] Date of Birth: May 06, 1946

## 2019-04-01 ENCOUNTER — Ambulatory Visit (INDEPENDENT_AMBULATORY_CARE_PROVIDER_SITE_OTHER): Payer: Medicare HMO | Admitting: Physical Therapy

## 2019-04-01 ENCOUNTER — Other Ambulatory Visit: Payer: Self-pay

## 2019-04-01 ENCOUNTER — Encounter: Payer: Self-pay | Admitting: Physical Therapy

## 2019-04-01 DIAGNOSIS — M25551 Pain in right hip: Secondary | ICD-10-CM

## 2019-04-01 DIAGNOSIS — R29898 Other symptoms and signs involving the musculoskeletal system: Secondary | ICD-10-CM

## 2019-04-01 DIAGNOSIS — R2689 Other abnormalities of gait and mobility: Secondary | ICD-10-CM

## 2019-04-01 DIAGNOSIS — M25552 Pain in left hip: Secondary | ICD-10-CM

## 2019-04-01 NOTE — Therapy (Signed)
[facility] [address] [place], [place], [address] Phone: [phone]   Fax:  [phone]  Physical Therapy Treatment  Patient Details  Name: Jennifer KernsSheryl Morand MRN: [id number] Date of Birth: 05/02/1946 Referring Provider (PT): Dr Denyse Amassorey    Encounter Date: 04/01/2019  PT End of Session - 04/01/19 1256    Visit Number  15    Number of Visits  24    Date for PT Re-Evaluation  05/03/19    PT Start Time  1208   pt arrived late   PT Stop Time  1250    PT Time Calculation (min)  42 min    Activity Tolerance  Patient tolerated treatment well    Behavior During Therapy  [facility] for tasks assessed/performed       Past Medical History:  Diagnosis Date  . COPD (chronic obstructive pulmonary disease) (HCC) 11/03/2018  . Familial hypercholanemia 11/03/2018  . HLD (hyperlipidemia) 11/03/2018  . HTN (hypertension) 11/03/2018  . Hypothyroid 11/03/2018  . Vitamin D deficiency 11/03/2018  . Von Willebrand disease (HCC) possible 11/03/2018   Seen in documentation from prior provider    Past Surgical History:  Procedure Laterality Date  . APPENDECTOMY  1978  . BIOPSY ENDOMETRIAL    . CESAREAN SECTION     triplets  . TUBAL LIGATION      There were no vitals filed for this visit.  Subjective Assessment - 04/01/19 1211    Subjective  Pt reports she feels her strength has improved, and sleeping in her bed is getting easier.  She reports she had relief from DN that lasted about 14 hrs, "it worked really good".  She is noticing greater ease with moving hips in and out of bed and shower.    Patient Stated Goals  get off the cane; get in and out of the bed more easily; get up and down easily     Currently in Pain?  Yes    Pain Score  2     Pain Location  Hip    Pain Orientation  Left;Right    Pain Descriptors / Indicators  Aching;Dull    Aggravating Factors   walking; getting up from low surface    Pain Relieving Factors  massage, medicine          [facility] PT Assessment - 04/01/19 0001      Assessment   Medical Diagnosis  Bilat hip pain Lt > Rt     Referring Provider (PT)  Dr Denyse Amassorey     Onset Date/Surgical Date  06/08/18    Hand Dominance  Right    Next MD Visit  not scheduled yet.     Prior Therapy  none        [facility] Adult PT Treatment/Exercise - 04/01/19 0001      Lumbar Exercises: Quadruped   Other Quadruped Lumbar Exercises  weight shifts side to side and front to back, x 10 each direction, with cues to slow speed and ease into hip ext       Knee/Hip Exercises: Stretches   Other Knee/Hip Stretches  seated hip adductor stretchx 15 sec x 2 reps each leg.       Knee/Hip Exercises: Aerobic   Nustep  L3: 6 min   initially limited in Lt hip, eased+ inc ROM with time     Knee/Hip Exercises: Supine   Other Supine Knee/Hip Exercises  supine LE IR/ER AROM with knees straight x 20, gentle  Knee/Hip Exercises: Prone   Hip Extension  Right;Left   3 reps each leg   Hip Extension Limitations  prone on leg elevator on low black mat; very challenging - cramped on LLE      Modalities   Modalities  --   pt declined; will do at home.      Manual Therapy   Manual therapy comments  Trunk propped on white leg elevator, legs straight    Soft tissue mobilization  Deep tissue work through bilat hip flexors and adductors, proximal quad; TPR to bilat iliacus and hip rotators     Myofascial Release  Lt/Rt proximal- distal quad                   PT Long Term Goals - 03/21/19 1420      PT LONG TERM GOAL #1   Title  Improve standing posture and alignment with patient to demonstrate neutral hip extension bilat 05/03/2019    Time  12    Period  Weeks    Status  Revised      PT LONG TERM GOAL #2   Title  Patient demonstrate improved gait pattern with least assistive device and safe gait pattern and minimal to no pain 8/252020    Time  12    Period  Weeks    Status  Revised      PT LONG TERM GOAL #3   Title   Independent in all transfers with good body mechanics 05/03/2019    Time  12    Period  Weeks    Status  Revised      PT LONG TERM GOAL #4   Title  Independent in HEP 05/03/2019    Time  12    Period  Weeks    Status  Revised      PT LONG TERM GOAL #5   Title  Improve FOTO to </= 37% limitation 05/03/2019    Time  12    Period  Weeks    Status  Revised            Plan - 04/01/19 1305    Clinical Impression Statement  Continued positive response to manual therapy/DN last session.  Pt making gradual gains with functional mobility at home and reporting improved compliance with HEP.  Quadruped position on low black table tolerated well, may try again next session as means for ROM in hips.  Gradually progresing towards goals.    Rehab Potential  Good    PT Frequency  2x / week    PT Duration  6 weeks    PT Treatment/Interventions  Patient/family education;ADLs/Self Care Home Management;Cryotherapy;Electrical Stimulation;Iontophoresis 4mg /ml Dexamethasone;Moist Heat;Ultrasound;Therapeutic activities;Therapeutic exercise;Balance training;Neuromuscular re-education;Gait training;Manual techniques;Dry needling    PT Next Visit Plan  continue stretching; postural correction; strengthening; functional activities;  DN as indicated    Consulted and Agree with Plan of Care  Patient       Patient will benefit from skilled therapeutic intervention in order to improve the following deficits and impairments:  Abnormal gait, Improper body mechanics, Postural dysfunction, Pain, Increased fascial restricitons, Increased muscle spasms, Hypomobility, Decreased mobility, Decreased range of motion, Decreased activity tolerance, Decreased endurance  Visit Diagnosis: 1. Pain in left hip   2. Pain in right hip   3. Other symptoms and signs involving the musculoskeletal system   4. Other abnormalities of gait and mobility        Problem List Patient Active Problem List   Diagnosis Date Noted  .  On  home oxygen therapy 11/23/2018  . Environmental allergies 11/23/2018  . White coat syndrome with diagnosis of hypertension 11/23/2018  . COPD (chronic obstructive pulmonary disease) ([place]) 11/03/2018  . Hypertension goal BP (blood pressure) < 140/90 11/03/2018  . Familial hypercholanemia 11/03/2018  . HLD (hyperlipidemia) 11/03/2018  . Vitamin D deficiency 11/03/2018  . Hypothyroid 11/03/2018  . Von Willebrand disease ([place]) possible 11/03/2018  . Elevated uric acid in blood 11/03/2018  . Centrilobular emphysema ([place]) 11/03/2018  . Hypoxia 11/03/2018  . Elevated troponin 09/18/2016   Kerin Perna, PTA 04/01/19 1:08 PM  [place], [place], [address] Phone: [phone]   Fax:  [phone]  Name: Kaleisha Bhargava MRN: [id number] Date of Birth: November 07, 1945

## 2019-04-05 ENCOUNTER — Ambulatory Visit (INDEPENDENT_AMBULATORY_CARE_PROVIDER_SITE_OTHER): Payer: Medicare HMO | Admitting: Rehabilitative and Restorative Service Providers"

## 2019-04-05 ENCOUNTER — Other Ambulatory Visit: Payer: Self-pay

## 2019-04-05 ENCOUNTER — Encounter: Payer: Self-pay | Admitting: Rehabilitative and Restorative Service Providers"

## 2019-04-05 DIAGNOSIS — M25552 Pain in left hip: Secondary | ICD-10-CM

## 2019-04-05 DIAGNOSIS — R2689 Other abnormalities of gait and mobility: Secondary | ICD-10-CM | POA: Diagnosis not present

## 2019-04-05 DIAGNOSIS — M25551 Pain in right hip: Secondary | ICD-10-CM | POA: Diagnosis not present

## 2019-04-05 DIAGNOSIS — R29898 Other symptoms and signs involving the musculoskeletal system: Secondary | ICD-10-CM

## 2019-04-05 NOTE — Therapy (Signed)
[place], [place], [address] Phone: [phone]   Fax:  [phone]  Physical Therapy Treatment  Patient Details  Name: Jennifer [name] MRN: [id number] Date of Birth: 07-May-1946 Referring Provider (PT): Dr Georgina Snell    Encounter Date: 04/05/2019  PT End of Session - 04/05/19 1206    Visit Number  16    Number of Visits  24    Date for PT Re-Evaluation  05/03/19    PT Start Time  1203    PT Stop Time  1255   moist heat x 10 min   PT Time Calculation (min)  52 min    Activity Tolerance  Patient tolerated treatment well       Past Medical History:  Diagnosis Date  . COPD (chronic obstructive pulmonary disease) ([place]) 11/03/2018  . Familial hypercholanemia 11/03/2018  . HLD (hyperlipidemia) 11/03/2018  . HTN (hypertension) 11/03/2018  . Hypothyroid 11/03/2018  . Vitamin D deficiency 11/03/2018  . Von Willebrand disease ([place]) possible 11/03/2018   Seen in documentation from prior provider    Past Surgical History:  Procedure Laterality Date  . APPENDECTOMY  1978  . BIOPSY ENDOMETRIAL    . CESAREAN SECTION     triplets  . TUBAL LIGATION      There were no vitals filed for this visit.  Subjective Assessment - 04/05/19 1207    Subjective  Doing OK - got onto her stomach at home this morning. Still sleeping in the bed. Working on her exercises some at home. She feels she is making some progress.    Currently in Pain?  Yes    Pain Score  2     Pain Location  Hip    Pain Orientation  Left;Right    Pain Descriptors / Indicators  Aching;Dull    Pain Type  Chronic pain                       [facility] Adult PT Treatment/Exercise - 04/05/19 0001      Knee/Hip Exercises: Aerobic   Nustep  L4: 7 min   initially limited in Lt hip, eased+ inc ROM with time     Knee/Hip Exercises: Standing   Hip Abduction  Stengthening;Right;Left;10 reps;Knee straight   UE support back of nustep seat  -more difficutl Rt> Lt    Hip Extension  AROM;Stengthening;Right;Left;10 reps   UE support back of nustep seat -inc difficult lifting Rt>Lt   Other Standing Knee Exercises  standing trunk ext, leaning hips into nustep seat pushing hips forward 4-5 reps 30-60 sec       Moist Heat Therapy   Number Minutes Moist Heat  10 Minutes    Moist Heat Location  Hip   anterior hips/thighs bilat      Manual Therapy   Manual therapy comments  pt supine hooklying with LE's supported on bolster     Soft tissue mobilization  Deep tissue work through bilat hip flexors and adductors, proximal quad; Lt TFL/ITB//lateral thigh     Myofascial Release  Lt/Rt proximal- distal quad        Trigger Point Dry Needling - 04/05/19 0001    Consent Given?  Yes    Education Handout Provided  Previously provided    Dry Needling Comments  bilat     Adductor Response  Palpable increased muscle length;Twitch response elicited    Rectus femoris Response  Palpable increased muscle length;Twitch response elicited    Tensor Fascia  Lata Response  Palpable increased muscle length;Twitch response elicited   Lt    Iliacus Response  Palpable increased muscle length;Twitch response elicited                PT Long Term Goals - 03/21/19 1420      PT LONG TERM GOAL #1   Title  Improve standing posture and alignment with patient to demonstrate neutral hip extension bilat 05/03/2019    Time  12    Period  Weeks    Status  Revised      PT LONG TERM GOAL #2   Title  Patient demonstrate improved gait pattern with least assistive device and safe gait pattern and minimal to no pain 8/252020    Time  12    Period  Weeks    Status  Revised      PT LONG TERM GOAL #3   Title  Independent in all transfers with good body mechanics 05/03/2019    Time  12    Period  Weeks    Status  Revised      PT LONG TERM GOAL #4   Title  Independent in HEP 05/03/2019    Time  12    Period  Weeks    Status  Revised      PT LONG TERM  GOAL #5   Title  Improve FOTO to </= 37% limitation 05/03/2019    Time  12    Period  Weeks    Status  Revised            Plan - 04/05/19 1207    Clinical Impression Statement  Gradual porgress noted with exercise; DN; manual work. Decreased palpable tightness through bilat hip adductors and hip flexors. Good response to DN and manual work. Patient reports increased activity level and some decrease in pain.    Rehab Potential  Good    PT Frequency  2x / week    PT Duration  6 weeks    PT Treatment/Interventions  Patient/family education;ADLs/Self Care Home Management;Cryotherapy;Electrical Stimulation;Iontophoresis 4mg /ml Dexamethasone;Moist Heat;Ultrasound;Therapeutic activities;Therapeutic exercise;Balance training;Neuromuscular re-education;Gait training;Manual techniques;Dry needling    PT Next Visit Plan  continue stretching; postural correction; strengthening; functional activities;  DN as indicated    Consulted and Agree with Plan of Care  Patient       Patient will benefit from skilled therapeutic intervention in order to improve the following deficits and impairments:  Abnormal gait, Improper body mechanics, Postural dysfunction, Pain, Increased fascial restricitons, Increased muscle spasms, Hypomobility, Decreased mobility, Decreased range of motion, Decreased activity tolerance, Decreased endurance  Visit Diagnosis: 1. Pain in left hip   2. Pain in right hip   3. Other symptoms and signs involving the musculoskeletal system   4. Other abnormalities of gait and mobility        Problem List Patient Active Problem List   Diagnosis Date Noted  . On home oxygen therapy 11/23/2018  . Environmental allergies 11/23/2018  . White coat syndrome with diagnosis of hypertension 11/23/2018  . COPD (chronic obstructive pulmonary disease) (HCC) 11/03/2018  . Hypertension goal BP (blood pressure) < 140/90 11/03/2018  . Familial hypercholanemia 11/03/2018  . HLD (hyperlipidemia)  11/03/2018  . Vitamin D deficiency 11/03/2018  . Hypothyroid 11/03/2018  . Von Willebrand disease (HCC) possible 11/03/2018  . Elevated uric acid in blood 11/03/2018  . Centrilobular emphysema (HCC) 11/03/2018  . Hypoxia 11/03/2018  . Elevated troponin 09/18/2016    Aiysha Jillson Rober MinionP Llesenia Fogal PT, MPH  04/05/2019, 1:00  PM  [facility] [address] [place], [place], [address] Phone: [phone]   Fax:  [phone]  Name: Sherren KernsSheryl Raulerson MRN: [id number] Date of Birth: 04-Feb-1946

## 2019-04-07 ENCOUNTER — Ambulatory Visit (INDEPENDENT_AMBULATORY_CARE_PROVIDER_SITE_OTHER): Payer: Medicare HMO | Admitting: Physical Therapy

## 2019-04-07 ENCOUNTER — Encounter: Payer: Self-pay | Admitting: Physical Therapy

## 2019-04-07 ENCOUNTER — Other Ambulatory Visit: Payer: Self-pay

## 2019-04-07 DIAGNOSIS — R2689 Other abnormalities of gait and mobility: Secondary | ICD-10-CM

## 2019-04-07 DIAGNOSIS — R29898 Other symptoms and signs involving the musculoskeletal system: Secondary | ICD-10-CM | POA: Diagnosis not present

## 2019-04-07 DIAGNOSIS — M25552 Pain in left hip: Secondary | ICD-10-CM | POA: Diagnosis not present

## 2019-04-07 DIAGNOSIS — M25551 Pain in right hip: Secondary | ICD-10-CM | POA: Diagnosis not present

## 2019-04-07 NOTE — Therapy (Signed)
[place], [place], [address] Phone: [phone]   Fax:  [phone]  Physical Therapy Treatment  Patient Details  Name: Jennifer [name] MRN: [id number] Date of Birth: 11-21-1945 Referring Provider (PT): Dr Georgina Snell    Encounter Date: 04/07/2019  PT End of Session - 04/07/19 1212    Visit Number  17    Number of Visits  24    Date for PT Re-Evaluation  05/03/19    PT Start Time  1210    PT Stop Time  1255    PT Time Calculation (min)  45 min       Past Medical History:  Diagnosis Date  . COPD (chronic obstructive pulmonary disease) ([place]) 11/03/2018  . Familial hypercholanemia 11/03/2018  . HLD (hyperlipidemia) 11/03/2018  . HTN (hypertension) 11/03/2018  . Hypothyroid 11/03/2018  . Vitamin D deficiency 11/03/2018  . Von Willebrand disease ([place]) possible 11/03/2018   Seen in documentation from prior provider    Past Surgical History:  Procedure Laterality Date  . APPENDECTOMY  1978  . BIOPSY ENDOMETRIAL    . CESAREAN SECTION     triplets  . TUBAL LIGATION      There were no vitals filed for this visit.  Subjective Assessment - 04/07/19 1212    Subjective  Patient reports she was sore all day yesterday from the needling.    Pertinent History  asthma; COPR; HTN    Patient Stated Goals  get off the cane; get in and out of the bed more easily; get up and down easily     Currently in Pain?  Yes    Pain Score  2     Pain Location  Hip    Pain Orientation  Right;Left    Pain Descriptors / Indicators  Aching    Pain Type  Chronic pain                       [facility] Adult PT Treatment/Exercise - 04/07/19 0001      Ambulation/Gait   Ambulation/Gait  Yes    Ambulation/Gait Assistance  6: Modified independent (Device/Increase time)    Ambulation Distance (Feet)  125 Feet    Assistive device  Rolling walker    Gait Pattern  Step-through pattern    Ambulation Surface   Indoor;Outdoor;Paved;Level;Unlevel      Therapeutic Activites    Therapeutic Activities  Other Therapeutic Activities    Other Therapeutic Activities  car transfer      Knee/Hip Exercises: Aerobic   Nustep  L4: 9 min   initially limited in Lt hip, eased+ inc ROM with time     Knee/Hip Exercises: Standing   Hip Extension  AROM;Stengthening;Right;Left;10 reps   UE support back of nustep seat -inc difficult lifting Rt>Lt   Other Standing Knee Exercises  standing trunk ext, leaning hips into nustep seat pushing hips forward 4-5 reps then 30 sec  hold x 2      Manual Therapy   Manual therapy comments  bil hip distraction and long leg traction             PT Education - 04/07/19 1315    Education Details  discussed importance of getting grab rails for apartment bathroom    Person(s) Educated  Patient    Methods  Explanation    Comprehension  Verbalized understanding          PT Long Term Goals - 03/21/19 1420  PT LONG TERM GOAL #1   Title  Improve standing posture and alignment with patient to demonstrate neutral hip extension bilat 05/03/2019    Time  12    Period  Weeks    Status  Revised      PT LONG TERM GOAL #2   Title  Patient demonstrate improved gait pattern with least assistive device and safe gait pattern and minimal to no pain 8/252020    Time  12    Period  Weeks    Status  Revised      PT LONG TERM GOAL #3   Title  Independent in all transfers with good body mechanics 05/03/2019    Time  12    Period  Weeks    Status  Revised      PT LONG TERM GOAL #4   Title  Independent in HEP 05/03/2019    Time  12    Period  Weeks    Status  Revised      PT LONG TERM GOAL #5   Title  Improve FOTO to </= 37% limitation 05/03/2019    Time  12    Period  Weeks    Status  Revised            Plan - 04/07/19 1216    Clinical Impression Statement  Patient experienced 2 LOB with small quad cane when walking into clinic. PT encouraged use of walker, but  pt states it's a lot of effort to put it in her car. Pt used clinic walker to walk to her car and demonstrated improved posture, gait and endurance which she acknowledged. Overall she was very limited today due to pain.    PT Treatment/Interventions  Patient/family education;ADLs/Self Care Home Management;Cryotherapy;Electrical Stimulation;Iontophoresis 4mg /ml Dexamethasone;Moist Heat;Ultrasound;Therapeutic activities;Therapeutic exercise;Balance training;Neuromuscular re-education;Gait training;Manual techniques;Dry needling    PT Next Visit Plan  continue stretching; postural correction; strengthening; functional activities;  DN as indicated       Patient will benefit from skilled therapeutic intervention in order to improve the following deficits and impairments:  Abnormal gait, Improper body mechanics, Postural dysfunction, Pain, Increased fascial restricitons, Increased muscle spasms, Hypomobility, Decreased mobility, Decreased range of motion, Decreased activity tolerance, Decreased endurance  Visit Diagnosis: 1. Pain in left hip   2. Pain in right hip   3. Other symptoms and signs involving the musculoskeletal system   4. Other abnormalities of gait and mobility        Problem List Patient Active Problem List   Diagnosis Date Noted  . On home oxygen therapy 11/23/2018  . Environmental allergies 11/23/2018  . White coat syndrome with diagnosis of hypertension 11/23/2018  . COPD (chronic obstructive pulmonary disease) (HCC) 11/03/2018  . Hypertension goal BP (blood pressure) < 140/90 11/03/2018  . Familial hypercholanemia 11/03/2018  . HLD (hyperlipidemia) 11/03/2018  . Vitamin D deficiency 11/03/2018  . Hypothyroid 11/03/2018  . Von Willebrand disease (HCC) possible 11/03/2018  . Elevated uric acid in blood 11/03/2018  . Centrilobular emphysema (HCC) 11/03/2018  . Hypoxia 11/03/2018  . Elevated troponin 09/18/2016    Solon PalmJulie Wessie Shanks PT 04/07/2019, 1:18 PM  [facility] [address] [place], [place], [address] Phone: [phone]   Fax:  [phone]  Name: Sherren KernsSheryl Gamarra MRN: [id number] Date of Birth: [birth year]

## 2019-04-12 ENCOUNTER — Ambulatory Visit (INDEPENDENT_AMBULATORY_CARE_PROVIDER_SITE_OTHER): Payer: Medicare HMO | Admitting: Physical Therapy

## 2019-04-12 ENCOUNTER — Other Ambulatory Visit: Payer: Self-pay

## 2019-04-12 DIAGNOSIS — R29898 Other symptoms and signs involving the musculoskeletal system: Secondary | ICD-10-CM

## 2019-04-12 DIAGNOSIS — M25552 Pain in left hip: Secondary | ICD-10-CM

## 2019-04-12 DIAGNOSIS — R2689 Other abnormalities of gait and mobility: Secondary | ICD-10-CM | POA: Diagnosis not present

## 2019-04-12 DIAGNOSIS — M25551 Pain in right hip: Secondary | ICD-10-CM | POA: Diagnosis not present

## 2019-04-12 NOTE — Therapy (Signed)
[facility] [address] [place], [place], [address] Phone: [phone]   Fax:  [phone]  Physical Therapy Treatment  Patient Details  Name: Jennifer KernsSheryl Low MRN: [id number] Date of Birth: 10-07-1945 Referring Provider (PT): Dr Denyse Amassorey    Encounter Date: 04/12/2019  PT End of Session - 04/12/19 1159    Visit Number  18    Number of Visits  24    Date for PT Re-Evaluation  05/03/19    PT Start Time  1105    PT Stop Time  1145    PT Time Calculation (min)  40 min    Behavior During Therapy  [facility] tasks assessed/performed       Past Medical History:  Diagnosis Date  . COPD (chronic obstructive pulmonary disease) (HCC) 11/03/2018  . Familial hypercholanemia 11/03/2018  . HLD (hyperlipidemia) 11/03/2018  . HTN (hypertension) 11/03/2018  . Hypothyroid 11/03/2018  . Vitamin D deficiency 11/03/2018  . Von Willebrand disease (HCC) possible 11/03/2018   Seen in documentation from prior provider    Past Surgical History:  Procedure Laterality Date  . APPENDECTOMY  1978  . BIOPSY ENDOMETRIAL    . CESAREAN SECTION     triplets  . TUBAL LIGATION      There were no vitals filed for this visit.  Subjective Assessment - 04/12/19 1113    Subjective  "When I woke up I felt great", but as the morning has gone on, her Lt hip pain has increased.  She ambulates into therapy with RW, "this is the first time I've used this" She states she had been sleeping in bed, but has returned to sleeping in chair for greater ease.    Patient Stated Goals  get off the cane; get in and out of the bed more easily; get up and down easily     Currently in Pain?  Yes    Pain Score  4     Pain Location  Hip    Pain Orientation  Left    Pain Descriptors / Indicators  Aching;Sharp;Tightness    Aggravating Factors   getting up from low surfaces.    Pain Relieving Factors  DN, massage, medicine.          [facility] PT Assessment - 04/12/19 0001      Assessment   Medical Diagnosis  Bilat hip pain Lt > Rt     Referring Provider (PT)  Dr Denyse Amassorey     Onset Date/Surgical Date  06/08/18    Hand Dominance  Right    Next MD Visit  not scheduled yet.     Prior Therapy  none         [facility] Adult PT Treatment/Exercise - 04/12/19 0001      Knee/Hip Exercises: Aerobic   Nustep  L4: 9 min   initially limited in Lt hip, eased+ inc ROM with time     Knee/Hip Exercises: Standing   Hip Abduction  Stengthening;Left;1 set;10 reps    Hip Extension  AROM;Stengthening;Left;10 reps   UE support back of nustep seat -inc difficult with Lt WB   Functional Squat  10 reps   UE support on NuStep chair back   Gait Training  60 ft with RW, with cues for upright posture, increased Rt knee flexion during swing through.  Heavy UE WB thru RW     Other Standing Knee Exercises  standing trunk ext, leaning hips into nustep seat pushing hips forward 4-5 reps then 30 sec  hold x 2      Knee/Hip Exercises: Seated   Ball Squeeze  10 reps of 5 sec     Clamshell with TheraBand  Yellow   12 reps    Marching  Both;1 set;5 reps    Sit to Sand  1 set;without UE support;5 reps      Manual Therapy   Manual therapy comments  long leg traction x 15 sec x 5 reps each leg.     Soft tissue mobilization  STM to Lt thigh to hip,  bilat glutes / piriformis at origin    Myofascial Release  Lt/Rt proximal- distal quad     Passive ROM  gentle rocking of LE into ER/IR                   PT Long Term Goals - 03/21/19 1420      PT LONG TERM GOAL #1   Title  Improve standing posture and alignment with patient to demonstrate neutral hip extension bilat 05/03/2019    Time  12    Period  Weeks    Status  Revised      PT LONG TERM GOAL #2   Title  Patient demonstrate improved gait pattern with least assistive device and safe gait pattern and minimal to no pain 8/252020    Time  12    Period  Weeks    Status  Revised      PT LONG TERM GOAL #3   Title  Independent in all  transfers with good body mechanics 05/03/2019    Time  12    Period  Weeks    Status  Revised      PT LONG TERM GOAL #4   Title  Independent in HEP 05/03/2019    Time  12    Period  Weeks    Status  Revised      PT LONG TERM GOAL #5   Title  Improve FOTO to </= 37% limitation 05/03/2019    Time  12    Period  Weeks    Status  Revised            Plan - 04/12/19 1244    Clinical Impression Statement  Pt ambulating to therapy and during therapy with RW; improved gait pattern, however continues to be forward flexed and with antalgic gait.  Continued tightness through hips, especially Lt hip.  Pain in hips, especially in weight bearing position, conitnues to be limiting factor.  Goals are ongoing.    PT Frequency  2x / week    PT Duration  6 weeks    PT Treatment/Interventions  Patient/family education;ADLs/Self Care Home Management;Cryotherapy;Electrical Stimulation;Iontophoresis 4mg /ml Dexamethasone;Moist Heat;Ultrasound;Therapeutic activities;Therapeutic exercise;Balance training;Neuromuscular re-education;Gait training;Manual techniques;Dry needling    PT Next Visit Plan  continue stretching; postural correction; strengthening; functional activities;  DN as indicated - progress HEP    Consulted and Agree with Plan of Care  Patient       Patient will benefit from skilled therapeutic intervention in order to improve the following deficits and impairments:  Abnormal gait, Improper body mechanics, Postural dysfunction, Pain, Increased fascial restricitons, Increased muscle spasms, Hypomobility, Decreased mobility, Decreased range of motion, Decreased activity tolerance, Decreased endurance  Visit Diagnosis: 1. Pain in left hip   2. Pain in right hip   3. Other symptoms and signs involving the musculoskeletal system   4. Other abnormalities of gait and mobility        Problem List Patient Active Problem  List   Diagnosis Date Noted  . On home oxygen therapy 11/23/2018  .  Environmental allergies 11/23/2018  . White coat syndrome with diagnosis of hypertension 11/23/2018  . COPD (chronic obstructive pulmonary disease) ([place]) 11/03/2018  . Hypertension goal BP (blood pressure) < 140/90 11/03/2018  . Familial hypercholanemia 11/03/2018  . HLD (hyperlipidemia) 11/03/2018  . Vitamin D deficiency 11/03/2018  . Hypothyroid 11/03/2018  . Von Willebrand disease ([place]) possible 11/03/2018  . Elevated uric acid in blood 11/03/2018  . Centrilobular emphysema ([place]) 11/03/2018  . Hypoxia 11/03/2018  . Elevated troponin 09/18/2016   Kerin Perna, PTA 04/12/19 12:48 PM  [place] [address] [place], [place], [address] Phone: [phone]   Fax:  [phone]  Name: Meena Barrantes MRN: [id number] Date of Birth: [birth year]

## 2019-04-15 ENCOUNTER — Ambulatory Visit (INDEPENDENT_AMBULATORY_CARE_PROVIDER_SITE_OTHER): Payer: Medicare HMO | Admitting: Rehabilitative and Restorative Service Providers"

## 2019-04-15 ENCOUNTER — Telehealth: Payer: Self-pay | Admitting: Physician Assistant

## 2019-04-15 ENCOUNTER — Encounter: Payer: Self-pay | Admitting: Rehabilitative and Restorative Service Providers"

## 2019-04-15 ENCOUNTER — Other Ambulatory Visit: Payer: Self-pay

## 2019-04-15 DIAGNOSIS — R29898 Other symptoms and signs involving the musculoskeletal system: Secondary | ICD-10-CM | POA: Diagnosis not present

## 2019-04-15 DIAGNOSIS — M25552 Pain in left hip: Secondary | ICD-10-CM

## 2019-04-15 DIAGNOSIS — M25551 Pain in right hip: Secondary | ICD-10-CM | POA: Diagnosis not present

## 2019-04-15 DIAGNOSIS — R2689 Other abnormalities of gait and mobility: Secondary | ICD-10-CM | POA: Diagnosis not present

## 2019-04-15 NOTE — Therapy (Addendum)
[place], [place], [address] Phone: [phone]   Fax:  [phone]  Physical Therapy Treatment  Patient Details  Name: Jennifer [name] MRN: [id number] Date of Birth: 03-16-1946 Referring Provider (PT): Dr Georgina Snell    Encounter Date: 04/15/2019  PT End of Session - 04/15/19 1322    Visit Number  19    Number of Visits  24    Date for PT Re-Evaluation  05/03/19    PT Start Time  1093    PT Stop Time  1402   moist heat end of treatment x 10 min   PT Time Calculation (min)  43 min    Activity Tolerance  Patient tolerated treatment well       Past Medical History:  Diagnosis Date  . COPD (chronic obstructive pulmonary disease) ([place]) 11/03/2018  . Familial hypercholanemia 11/03/2018  . HLD (hyperlipidemia) 11/03/2018  . HTN (hypertension) 11/03/2018  . Hypothyroid 11/03/2018  . Vitamin D deficiency 11/03/2018  . Von Willebrand disease ([place]) possible 11/03/2018   Seen in documentation from prior provider    Past Surgical History:  Procedure Laterality Date  . APPENDECTOMY  1978  . BIOPSY ENDOMETRIAL    . CESAREAN SECTION     triplets  . TUBAL LIGATION      There were no vitals filed for this visit.  Subjective Assessment - 04/15/19 1326    Subjective  Patient reports that she has more pain today. She has increased pain in the joints. Has ordered Aspercream which seems to help with the pain in her thighs. She is planning to call MD to discuss injections in both hips.    Currently in Pain?  Yes    Pain Score  6     Pain Location  Hip    Pain Orientation  Left;Right    Pain Descriptors / Indicators  Aching;Tightness    Pain Type  Chronic pain                       [facility] Adult PT Treatment/Exercise - 04/15/19 0001      Knee/Hip Exercises: Aerobic   Nustep  L5 x 6 min moving slowly    initially limited in Lt hip, eased+ inc ROM with time     Moist Heat Therapy   Number  Minutes Moist Heat  10 Minutes    Moist Heat Location  Hip   bilat anterior hips/quads      Manual Therapy   Soft tissue mobilization  deep tissue work bilat anterior hips through iliacus/hip flexors; adductors; quads        Trigger Point Dry Needling - 04/15/19 0001    Consent Given?  Yes    Education Handout Provided  Previously provided    Dry Needling Comments  bilat     Adductor Response  Palpable increased muscle length;Twitch response elicited    Rectus femoris Response  Palpable increased muscle length;Twitch response elicited    Iliacus Response  Palpable increased muscle length;Twitch response elicited                PT Long Term Goals - 03/21/19 1420      PT LONG TERM GOAL #1   Title  Improve standing posture and alignment with patient to demonstrate neutral hip extension bilat 05/03/2019    Time  12    Period  Weeks    Status  Revised      PT LONG TERM GOAL #  2   Title  Patient demonstrate improved gait pattern with least assistive device and safe gait pattern and minimal to no pain 8/252020    Time  12    Period  Weeks    Status  Revised      PT LONG TERM GOAL #3   Title  Independent in all transfers with good body mechanics 05/03/2019    Time  12    Period  Weeks    Status  Revised      PT LONG TERM GOAL #4   Title  Independent in HEP 05/03/2019    Time  12    Period  Weeks    Status  Revised      PT LONG TERM GOAL #5   Title  Improve FOTO to </= 37% limitation 05/03/2019    Time  12    Period  Weeks    Status  Revised            Plan - 04/15/19 1324    Clinical Impression Statement  Increased pain today - not sure of anything she did differently. Patient having more trouble with exercises and standing activities. Patient has typically responds well to DN. Patient's gait is improved with rolling walker - safer and improved alignment - remains forward flexed at hips and trunk.    PT Frequency  2x / week    PT Duration  6 weeks    PT  Treatment/Interventions  Patient/family education;ADLs/Self Care Home Management;Cryotherapy;Electrical Stimulation;Iontophoresis 80m/ml Dexamethasone;Moist Heat;Ultrasound;Therapeutic activities;Therapeutic exercise;Balance training;Neuromuscular re-education;Gait training;Manual techniques;Dry needling    PT Next Visit Plan  continue stretching; postural correction; strengthening; functional activities;  DN as indicated - progress HEP    Consulted and Agree with Plan of Care  Patient       Patient will benefit from skilled therapeutic intervention in order to improve the following deficits and impairments:  Abnormal gait, Improper body mechanics, Postural dysfunction, Pain, Increased fascial restricitons, Increased muscle spasms, Hypomobility, Decreased mobility, Decreased range of motion, Decreased activity tolerance, Decreased endurance  Visit Diagnosis: 1. Pain in left hip   2. Pain in right hip   3. Other symptoms and signs involving the musculoskeletal system   4. Other abnormalities of gait and mobility        Problem List Patient Active Problem List   Diagnosis Date Noted  . On home oxygen therapy 11/23/2018  . Environmental allergies 11/23/2018  . White coat syndrome with diagnosis of hypertension 11/23/2018  . COPD (chronic obstructive pulmonary disease) ([place] 11/03/2018  . Hypertension goal BP (blood pressure) < 140/90 11/03/2018  . Familial hypercholanemia 11/03/2018  . HLD (hyperlipidemia) 11/03/2018  . Vitamin D deficiency 11/03/2018  . Hypothyroid 11/03/2018  . [place]brand disease ([place] possible 11/03/2018  . Elevated uric acid in blood 11/03/2018  . Centrilobular emphysema ([place] 11/03/2018  . Hypoxia 11/03/2018  . Elevated troponin 09/18/2016    Devere Brem PNilda SimmerPT, MPH  04/15/2019, 3:48 PM  [facility] [address]Phone: [phone]  Fax:  [phone] Name: Jennifer PasionMRN: [id number] of Birth: 1May 31, 1947  PHYSICAL THERAPY DISCHARGE SUMMARY  Visits from Start of Care: 19  Current functional level related to goals / functional outcomes: See last progress note for discharge status.   Remaining deficits: Unknown    Education / Equipment: HEP  Plan: Patient agrees to discharge.  Patient goals were not met. Patient is being discharged due to  not returning since the last visit.  ?????     Leonette Tischer P. Helene Kelp PT, MPH 05/13/19 9:25 AM

## 2019-04-15 NOTE — Telephone Encounter (Signed)
Patient would like to know if she can get some cortisone shots in her joints. Please advise. Patient is traveling the following weekend and would like these done as soon as possible. Please advise.

## 2019-04-18 NOTE — Telephone Encounter (Signed)
Certainly.  She can schedule with me ASAP.

## 2019-04-19 NOTE — Telephone Encounter (Signed)
Patient already has an appointment.     Thank you.

## 2019-04-20 ENCOUNTER — Other Ambulatory Visit: Payer: Self-pay

## 2019-04-20 ENCOUNTER — Ambulatory Visit (INDEPENDENT_AMBULATORY_CARE_PROVIDER_SITE_OTHER): Payer: Medicare HMO | Admitting: Family Medicine

## 2019-04-20 DIAGNOSIS — M25551 Pain in right hip: Secondary | ICD-10-CM

## 2019-04-20 DIAGNOSIS — M25552 Pain in left hip: Secondary | ICD-10-CM | POA: Diagnosis not present

## 2019-04-20 DIAGNOSIS — M160 Bilateral primary osteoarthritis of hip: Secondary | ICD-10-CM | POA: Diagnosis not present

## 2019-04-20 MED ORDER — CYCLOBENZAPRINE HCL 5 MG PO TABS: 5.0000 mg | ORAL_TABLET | Freq: Three times a day (TID) | ORAL | 1 refills | Status: DC | PRN

## 2019-04-20 MED ORDER — HYDROCODONE-ACETAMINOPHEN 5-325 MG PO TABS: 1.0000 | ORAL_TABLET | Freq: Four times a day (QID) | ORAL | 0 refills | Status: DC | PRN

## 2019-04-20 NOTE — Progress Notes (Signed)
Jennifer KernsSheryl [name] is a 73 y.o. female who [facility] for bilateral hip pain.  Patient has been seen previously for hip pain bilaterally.  She has moderate to severe changes on x-ray obtained February 2020.  She also at the time had evidence of trochanteric bursitis.  She is had a pretty extensive trial of physical therapy which she notes has improved her tightness and pain into her buttocks and quadriceps quite a bit.  However she continues to experience severe anterior hip pain bilaterally left worse than right.  She has been limping quite significantly and pretty uncomfortable overall.  She is done some reading and research and understands that she will probably need a total hip replacement would like to delay that if possible.  She would like to proceed to trials of an diagnostic and therapeutic injection today if possible.   ROS:  As above  Exam:  Heart rate 80 bpm respiratory rate 15 breaths/min.  Afebrile Wt Readings from Last 5 Encounters:  01/03/19 193 lb (87.5 kg)  11/12/18 198 lb (89.8 kg)  11/03/18 199 lb (90.3 kg)   General: Well Developed, well nourished, and in no acute distress.  Neuro/Psych: Alert and oriented x3, extra-ocular muscles intact, able to move all 4 extremities, sensation grossly intact. Skin: Warm and dry, no rashes noted.  Respiratory: Not using accessory muscles, speaking in full sentences, trachea midline.  Cardiovascular: Pulses palpable, no extremity edema. Abdomen: Does not appear distended. MSK: Right hip: Normal-appearing significant limited range of motion.  Not particular tender palpation. Left hip: Normal-appearing significant limited range of motion nontender palpation.    Lab and Radiology Results Procedure: Real-time Ultrasound Guided Injection of left hip femoral acetabular joint Device: GE Logiq E   Images permanently stored and available for review in the ultrasound unit.  Verbal informed consent obtained.  Discussed risks and benefits of procedure. Warned about infection bleeding damage to structures skin hypopigmentation and fat atrophy among others. Patient expresses understanding and agreement Time-out conducted.   Noted no overlying erythema, induration, or other signs of local infection.   Skin prepped in a sterile fashion.   Local anesthesia: Topical Ethyl chloride.   With sterile technique and under real time ultrasound guidance:  40 mg of Kenalog and 4 mL of Marcaine injected easily.   Completed without difficulty   Pain immediately resolved suggesting accurate placement of the medication.   Advised to call if fevers/chills, erythema, induration, drainage, or persistent bleeding.   Images permanently stored and available for review in the ultrasound unit.  Impression: Technically successful ultrasound guided injection.     Procedure: Real-time Ultrasound Guided Injection of right hip femoral acetabular joint Device: GE Logiq E   Images permanently stored and available for review in the ultrasound unit. Verbal informed consent obtained.  Discussed risks and benefits of procedure. Warned about infection bleeding damage to structures skin hypopigmentation and fat atrophy among others. Patient expresses understanding and agreement Time-out conducted.   Noted no overlying erythema, induration, or other signs of local infection.   Skin prepped in a sterile fashion.   Local anesthesia: Topical Ethyl chloride.   With sterile technique and under real time ultrasound guidance:  40 mg of Kenalog and 4 mL of Marcaine injected easily.   Completed without difficulty   Pain immediately resolved suggesting accurate placement of the medication.   Advised to call if fevers/chills, erythema, induration, drainage, or persistent bleeding.   Images permanently stored and available for  review in the ultrasound unit.  Impression: Technically successful ultrasound  guided injection.   EXAM: DG HIP (WITH OR WITHOUT PELVIS) 3-4V BILAT  COMPARISON:  None.  FINDINGS: Hips are located. No evidence of pelvic fracture or sacral fracture. Dedicated view of the LEFT and RIGHT hips demonstrates no femoral neck fracture.  There is joint space narrowing superiorly in the LEFT andRIGHT hip joint. Mild sclerosis of the femoral heads and mild remodeling.  IMPRESSION: 1. No pelvic fracture. 2. No femoral neck fracture. 3. Bilateral moderate osteoarthritis of the hip joints with joint space narrowing and mild remodeling of the femoral heads.   Electronically Signed   By: Suzy Bouchard M.D.   On: 11/03/2018 17:06 I personally (independently) visualized and performed the interpretation of the images attached in this note.        Assessment and Plan: 73 y.o. female with  Bilateral hip pain.  Patient had pretty significant improvement in pain immediately following injection.  She still has some difficulty walking and limited range of motion of her hips which is not surprising.  Discussed that injections are likely not going to be a long-term solution to her pain but if we could buy her some time that was probably a good trial.  If she gets good long-lasting benefit reasonable to repeat however if the benefit and pain relief does not last very long at that point total hip replacement is clearly her best option.  We will use a very small amount of hydrocodone for pain control immediately following injection tonight and tomorrow while waiting for the steroid component to start working.  He has had some benefit with cyclobenzaprine in the past for some of the pain that she has been having.  We will continue to use that as needed sparingly.  Cautioned against using with other sedating of medication such as Xanax.  I spent 25 minutes with this patient, greater than 50% was face-to-face time counseling regarding diagnosis treatment plan options next  steps risks and benefits.  PDMP reviewed during this encounter. No orders of the defined types were placed in this encounter.  Meds ordered this encounter  Medications  . HYDROcodone-acetaminophen (NORCO/VICODIN) 5-325 MG tablet    Sig: Take 1 tablet by mouth every 6 (six) hours as needed.    Dispense:  10 tablet    Refill:  0  . cyclobenzaprine (FLEXERIL) 5 MG tablet    Sig: Take 1 tablet (5 mg total) by mouth 3 (three) times daily as needed for muscle spasms.    Dispense:  90 tablet    Refill:  1    Do not take with xanax    Historical information moved to improve visibility of documentation.  Past Medical History:  Diagnosis Date  . COPD (chronic obstructive pulmonary disease) ([place]) 11/03/2018  . Familial hypercholanemia 11/03/2018  . HLD (hyperlipidemia) 11/03/2018  . HTN (hypertension) 11/03/2018  . Hypothyroid 11/03/2018  . Vitamin D deficiency 11/03/2018  . Von Willebrand disease ([place]) possible 11/03/2018   Seen in documentation from prior provider   Past Surgical History:  Procedure Laterality Date  . APPENDECTOMY  1978  . BIOPSY ENDOMETRIAL    . CESAREAN SECTION     triplets  . TUBAL LIGATION     Social History   Tobacco Use  . Smoking status: Former Smoker    Packs/day: 0.50    Years: 15.00    Pack years: 7.50    Types: Cigarettes    Quit date: 07/22/2014  Years since quitting: 4.7  . Smokeless tobacco: Never Used  Substance Use Topics  . Alcohol use: Not on file   family history includes Asthma in her brother; Cancer in her father; Parkinson's disease in her mother.  Medications: Current Outpatient Medications  Medication Sig Dispense Refill  . acetaminophen (TYLENOL) 650 MG CR tablet Take 650 mg by mouth every 8 (eight) hours as needed.    [place] Kitchen. albuterol (PROVENTIL HFA;VENTOLIN HFA) 108 (90 Base) MCG/ACT inhaler Inhale 1-2 puffs into the lungs every 4 (four) hours as needed for wheezing or shortness of breath. 1 Inhaler 5  . ALPRAZolam (XANAX) 0.5 MG  tablet Take by mouth.    . celecoxib (CELEBREX) 100 MG capsule Take 1 capsule (100 mg total) by mouth 2 (two) times daily as needed for moderate pain. 60 capsule 3  . colchicine 0.6 MG tablet Take 1 tablet (0.6 mg total) by mouth daily. 30 tablet 1  . cyclobenzaprine (FLEXERIL) 5 MG tablet Take 1 tablet (5 mg total) by mouth 3 (three) times daily as needed for muscle spasms. 90 tablet 1  . HYDROcodone-acetaminophen (NORCO/VICODIN) 5-325 MG tablet Take 1 tablet by mouth every 6 (six) hours as needed. 10 tablet 0  . lisinopril-hydrochlorothiazide (PRINZIDE,ZESTORETIC) 20-25 MG tablet Take 1 tablet by mouth daily. 90 tablet 1  . montelukast (SINGULAIR) 10 MG tablet Take 1 tablet (10 mg total) by mouth at bedtime. 90 tablet 2  . SYMBICORT 160-4.5 MCG/ACT inhaler Inhale 2 puffs into the lungs 2 (two) times daily. 1 Inhaler 6   No current facility-administered medications for this visit.    Allergies  Allergen Reactions  . Prednisone Other (See Comments)    Other reaction(s): psychological reaction, hallucinations  . Statins Other (See Comments)    Other reaction(s): muscle/joint pain Weakness and tiredness      Discussed warning signs or symptoms. Please see discharge instructions. Patient expresses understanding.

## 2019-04-20 NOTE — Patient Instructions (Addendum)
Thank you for coming in today. Call or go to the ER if you develop a large red swollen joint with extreme pain or oozing puss.  Lets see how you do with the injections.  It should improve in a few days.  If you have severe pain ok to use pain medicine.   Use norco sparingly.   Recheck as needed.   Call or go to the ER if you develop a large red swollen joint with extreme pain or oozing puss.

## 2019-05-19 ENCOUNTER — Telehealth: Payer: Self-pay | Admitting: Physician Assistant

## 2019-05-19 DIAGNOSIS — D6800 Von Willebrand disease, unspecified: Secondary | ICD-10-CM

## 2019-05-19 DIAGNOSIS — E7879 Other disorders of bile acid and cholesterol metabolism: Secondary | ICD-10-CM

## 2019-05-19 DIAGNOSIS — I10 Essential (primary) hypertension: Secondary | ICD-10-CM

## 2019-05-19 DIAGNOSIS — E785 Hyperlipidemia, unspecified: Secondary | ICD-10-CM

## 2019-05-19 DIAGNOSIS — J449 Chronic obstructive pulmonary disease, unspecified: Secondary | ICD-10-CM

## 2019-05-19 DIAGNOSIS — E039 Hypothyroidism, unspecified: Secondary | ICD-10-CM

## 2019-05-19 DIAGNOSIS — Z5181 Encounter for therapeutic drug level monitoring: Secondary | ICD-10-CM

## 2019-05-19 NOTE — Telephone Encounter (Signed)
Patient would like advise on is she needs a hip replacement or what needs to be done from last visit that she had with you. States that she wants to come up with a plan and also would like a full lab panel done like last time. Would like a phone call back from you. I did offer an appointment but she wanted to know if you could just call her first. Please advise.

## 2019-05-20 NOTE — Telephone Encounter (Signed)
Talked with patient.   Hips improved a bit but having some thigh pain.  Patient would like labs done prior to visit in the near future. Ordered routine fasting labs for her to follow-up her various medical problems and will schedule visit on September 29.

## 2019-05-30 DIAGNOSIS — D680 Von Willebrand's disease: Secondary | ICD-10-CM | POA: Diagnosis not present

## 2019-05-30 DIAGNOSIS — E785 Hyperlipidemia, unspecified: Secondary | ICD-10-CM | POA: Diagnosis not present

## 2019-05-30 DIAGNOSIS — Z5181 Encounter for therapeutic drug level monitoring: Secondary | ICD-10-CM | POA: Diagnosis not present

## 2019-05-30 DIAGNOSIS — I10 Essential (primary) hypertension: Secondary | ICD-10-CM | POA: Diagnosis not present

## 2019-05-30 DIAGNOSIS — E039 Hypothyroidism, unspecified: Secondary | ICD-10-CM | POA: Diagnosis not present

## 2019-05-30 DIAGNOSIS — J449 Chronic obstructive pulmonary disease, unspecified: Secondary | ICD-10-CM | POA: Diagnosis not present

## 2019-05-30 DIAGNOSIS — E7870 Disorder of bile acid and cholesterol metabolism, unspecified: Secondary | ICD-10-CM | POA: Diagnosis not present

## 2019-05-31 ENCOUNTER — Ambulatory Visit: Payer: Medicare HMO | Admitting: Physician Assistant

## 2019-05-31 LAB — COMPLETE METABOLIC PANEL WITH GFR
AG Ratio: 1.6 (calc) (ref 1.0–2.5)
ALT: 14 U/L (ref 6–29)
AST: 19 U/L (ref 10–35)
Albumin: 4.1 g/dL (ref 3.6–5.1)
Alkaline phosphatase (APISO): 79 U/L (ref 37–153)
BUN/Creatinine Ratio: 23 (calc) — ABNORMAL HIGH (ref 6–22)
BUN: 28 mg/dL — ABNORMAL HIGH (ref 7–25)
CO2: 27 mmol/L (ref 20–32)
Calcium: 10.2 mg/dL (ref 8.6–10.4)
Chloride: 101 mmol/L (ref 98–110)
Creat: 1.2 mg/dL — ABNORMAL HIGH (ref 0.60–0.93)
GFR, Est African American: 53 mL/min/{1.73_m2} — ABNORMAL LOW (ref >=60)
GFR, Est Non African American: 46 mL/min/{1.73_m2} — ABNORMAL LOW (ref >=60)
Globulin: 2.5 g/dL (calc) (ref 1.9–3.7)
Glucose, Bld: 93 mg/dL (ref 65–99)
Potassium: 3.8 mmol/L (ref 3.5–5.3)
Sodium: 139 mmol/L (ref 135–146)
Total Bilirubin: 0.7 mg/dL (ref 0.2–1.2)
Total Protein: 6.6 g/dL (ref 6.1–8.1)

## 2019-05-31 LAB — LIPID PANEL W/REFLEX DIRECT LDL
Cholesterol: 211 mg/dL — ABNORMAL HIGH (ref <200)
HDL: 55 mg/dL (ref >=50)
LDL Cholesterol (Calc): 136 mg/dL (calc) — ABNORMAL HIGH
Non-HDL Cholesterol (Calc): 156 mg/dL (calc) — ABNORMAL HIGH (ref <130)
Total CHOL/HDL Ratio: 3.8 (calc) (ref <5.0)
Triglycerides: 95 mg/dL (ref <150)

## 2019-05-31 LAB — PTH, INTACT AND CALCIUM
Calcium: 10.2 mg/dL (ref 8.6–10.4)
PTH: 26 pg/mL (ref 14–64)

## 2019-05-31 LAB — CBC
HCT: 38.8 % (ref 35.0–45.0)
Hemoglobin: 13.6 g/dL (ref 11.7–15.5)
MCH: 33.1 pg — ABNORMAL HIGH (ref 27.0–33.0)
MCHC: 35.1 g/dL (ref 32.0–36.0)
MCV: 94.4 fL (ref 80.0–100.0)
MPV: 10.1 fL (ref 7.5–12.5)
Platelets: 206 10*3/uL (ref 140–400)
RBC: 4.11 10*6/uL (ref 3.80–5.10)
RDW: 12.6 % (ref 11.0–15.0)
WBC: 7.1 10*3/uL (ref 3.8–10.8)

## 2019-05-31 LAB — TSH: TSH: 2.92 mIU/L (ref 0.40–4.50)

## 2019-06-07 ENCOUNTER — Ambulatory Visit (INDEPENDENT_AMBULATORY_CARE_PROVIDER_SITE_OTHER): Payer: Medicare HMO | Admitting: Family Medicine

## 2019-06-07 ENCOUNTER — Other Ambulatory Visit: Payer: Self-pay

## 2019-06-07 VITALS — BP 92/63 | HR 132

## 2019-06-07 DIAGNOSIS — I10 Essential (primary) hypertension: Secondary | ICD-10-CM

## 2019-06-07 DIAGNOSIS — J432 Centrilobular emphysema: Secondary | ICD-10-CM

## 2019-06-07 DIAGNOSIS — M160 Bilateral primary osteoarthritis of hip: Secondary | ICD-10-CM

## 2019-06-07 DIAGNOSIS — N183 Chronic kidney disease, stage 3 unspecified: Secondary | ICD-10-CM

## 2019-06-07 DIAGNOSIS — Z9109 Other allergy status, other than to drugs and biological substances: Secondary | ICD-10-CM | POA: Diagnosis not present

## 2019-06-07 MED ORDER — ALBUTEROL SULFATE HFA 108 (90 BASE) MCG/ACT IN AERS: 1.0000 | INHALATION_SPRAY | RESPIRATORY_TRACT | 0 refills | Status: DC | PRN

## 2019-06-07 MED ORDER — LISINOPRIL-HYDROCHLOROTHIAZIDE 10-12.5 MG PO TABS: 1.0000 | ORAL_TABLET | Freq: Every day | ORAL | 3 refills | Status: DC

## 2019-06-07 MED ORDER — MONTELUKAST SODIUM 10 MG PO TABS: 10.0000 mg | ORAL_TABLET | Freq: Every day | ORAL | 2 refills | Status: DC

## 2019-06-07 MED ORDER — TRELEGY ELLIPTA 100-62.5-25 MCG/INH IN AEPB: 1.0000 | INHALATION_SPRAY | Freq: Every day | RESPIRATORY_TRACT | 1 refills | Status: DC

## 2019-06-07 MED ORDER — CYCLOBENZAPRINE HCL 5 MG PO TABS: 5.0000 mg | ORAL_TABLET | Freq: Three times a day (TID) | ORAL | 1 refills | Status: DC | PRN

## 2019-06-07 NOTE — Progress Notes (Signed)
Jennifer [name] is a 73 y.o. female who presents to [facility]: Primary Care Sports Medicine today for follow-up bilateral hip pain, hypertension, CKD.  Bilateral hip pain. Jennifer [name] has bilateral hip pain attributable to DJD.  She is had work-up and evaluation for this previously.  She has had bilateral femoral acetabular injections which provided immediate and somewhat sustained pain control.  She notes continued difficulty with motion however.  She is already had a course of physical therapy for this.  She is willing to consider total hip replacement at this time.  She is actively working on weight loss as a way to help manage her hip DJD.  Hypertension: Patient has a history of hypertension managed with lisinopril/hydrochlorothiazide 20/25.  She notes with weight loss her blood pressures have been lower when checked at home.  She notes her blood pressures are sometimes into the 90s or low 100s systolic.  She denies chest pain or palpitations at home.  Resting heart rate at home is usually 70s.  She does not feel palpitations currently.  She does not have lightheadedness currently.  CKD: Patient had labs tested earlier this month and her creatinine had increased from a baseline of about 0.7 till about 1.2 with GFR of around 45.  She discontinued her Celebrex in response.  COPD: Patient has a history of COPD.  She currently is taking Symbicort.  She still has some shortness of breath with exertion.  She denies significant wheezing or cough or congestion. ROS as above:  Exam:  BP 92/63   Pulse (!) 132  Wt Readings from Last 5 Encounters:  01/03/19 193 lb (87.5 kg)  11/12/18 198 lb (89.8 kg)  11/03/18 199 lb (90.3 kg)    Gen: Well NAD HEENT: EOMI,  MMM Lungs: Normal work of breathing.  Mild wheeze with prolonged expiratory phase bilaterally. Heart: Regular rhythm mild tachycardia rate 100 bpm.  No MRG  Abd: NABS, Soft. Nondistended, Nontender Exts: Brisk capillary refill, warm and well perfused.  Hips bilaterally decreased range of motion.  Ambulation with walker.   Lab and Radiology Results EXAM: DG HIP (WITH OR WITHOUT PELVIS) 3-4V BILAT  COMPARISON:  None.  FINDINGS: Hips are located. No evidence of pelvic fracture or sacral fracture. Dedicated view of the LEFT and RIGHT hips demonstrates no femoral neck fracture.  There is joint space narrowing superiorly in the LEFT andRIGHT hip joint. Mild sclerosis of the femoral heads and mild remodeling.  IMPRESSION: 1. No pelvic fracture. 2. No femoral neck fracture. 3. Bilateral moderate osteoarthritis of the hip joints with joint space narrowing and mild remodeling of the femoral heads.   Electronically Signed   By: Genevive Bi M.D.   On: 11/03/2018 17:06 I personally (independently) visualized and performed the interpretation of the images attached in this note.     Chemistry      Component Value Date/Time   NA 139 05/30/2019 1055   NA 141 06/18/2018   K 3.8 05/30/2019 1055   CL 101 05/30/2019 1055   CO2 27 05/30/2019 1055   BUN 28 (H) 05/30/2019 1055   BUN 21 06/18/2018   CREATININE 1.20 (H) 05/30/2019 1055   GLU 80 06/18/2018      Component Value Date/Time   CALCIUM 10.2 05/30/2019 1055   CALCIUM 10.2 05/30/2019 1055   ALKPHOS 78 06/18/2018   AST 19 05/30/2019 1055   ALT 14 05/30/2019 1055   BILITOT 0.7 05/30/2019 1055  Assessment and Plan: 73 y.o. female with  Bilateral hip pain and lack of range of motion.  Due to DJD.  Patient is maximized her conservative management including trial of physical therapy, bilateral hip injection, and weight loss.  She has considerable dysfunction.  I think is reasonable at this time to discuss her situation with orthopedic surgery to discuss total hip replacement.  Lengthy discussion about pros and cons of total hip replacement today with patient.   Hypertension/hypotension.  Patient has history of hypertension.  She is lost weight and now her blood pressure is lower than goal.  She also has a bit of tachycardia.  I suspect she probably has effectively too much blood pressure control at this time.  Plan to decrease lisinopril/hydrochlorothiazide to 10/12.5.  Plan to recheck metabolic panel in about 3 to 4 weeks.  Plan for home blood pressure and heart rate log.  Tachycardia: Likely due to hypotension.  Regular rhythm per exam today.  Decrease blood pressure medication as above.  CKD: Patient had worsening creatinine recently.  She has already discontinued Celebrex which was a good idea.  I suspect lower blood pressure probably is a cause here as well.  Plan to decrease blood pressure regimen as above and recheck metabolic panel in about 3 weeks.  COPD: Not optimized with Symbicort.  Plan to switch to Trelegy for LABA/LAMA/ICS therapy.  Discontinue Symbicort.  I spent 40 minutes with this patient, greater than 50% was face-to-face time counseling regarding medication change total hip replacement pros and cons next steps and backup plan..   Orders Placed This Encounter  Procedures  . BASIC METABOLIC PANEL WITH GFR  . Ambulatory referral to Orthopedic Surgery    Referral Priority:   Routine    Referral Type:   Surgical    Referral Reason:   Specialty Services Required    Referred to Provider:   Paralee Cancel, MD    Requested Specialty:   Orthopedic Surgery    Number of Visits Requested:   1   Meds ordered this encounter  Medications  . albuterol (VENTOLIN HFA) 108 (90 Base) MCG/ACT inhaler    Sig: Inhale 1-2 puffs into the lungs every 4 (four) hours as needed for wheezing or shortness of breath.    Dispense:  16 g    Refill:  0  . cyclobenzaprine (FLEXERIL) 5 MG tablet    Sig: Take 1 tablet (5 mg total) by mouth 3 (three) times daily as needed for muscle spasms.    Dispense:  90 tablet    Refill:  1    Do not take with xanax  .  montelukast (SINGULAIR) 10 MG tablet    Sig: Take 1 tablet (10 mg total) by mouth at bedtime.    Dispense:  90 tablet    Refill:  2  . lisinopril-hydrochlorothiazide (ZESTORETIC) 10-12.5 MG tablet    Sig: Take 1 tablet by mouth daily.    Dispense:  90 tablet    Refill:  3  . Fluticasone-Umeclidin-Vilant (TRELEGY ELLIPTA) 100-62.5-25 MCG/INH AEPB    Sig: Inhale 1 puff into the lungs daily.    Dispense:  90 each    Refill:  1     Historical information moved to improve visibility of documentation.  Past Medical History:  Diagnosis Date  . COPD (chronic obstructive pulmonary disease) ([place]) 11/03/2018  . Familial hypercholanemia 11/03/2018  . HLD (hyperlipidemia) 11/03/2018  . HTN (hypertension) 11/03/2018  . Hypothyroid 11/03/2018  . Vitamin D deficiency 11/03/2018  . Von  Willebrand disease (HCC) possible 11/03/2018   Seen in documentation from prior provider   Past Surgical History:  Procedure Laterality Date  . APPENDECTOMY  1978  . BIOPSY ENDOMETRIAL    . CESAREAN SECTION     triplets  . TUBAL LIGATION     Social History   Tobacco Use  . Smoking status: Former Smoker    Packs/day: 0.50    Years: 15.00    Pack years: 7.50    Types: Cigarettes    Quit date: 07/22/2014    Years since quitting: 4.8  . Smokeless tobacco: Never Used  Substance Use Topics  . Alcohol use: Not on file   family history includes Asthma in her brother; Cancer in her father; Parkinson's disease in her mother.  Medications: Current Outpatient Medications  Medication Sig Dispense Refill  . acetaminophen (TYLENOL) 650 MG CR tablet Take 650 mg by mouth every 8 (eight) hours as needed.    Marland Kitchen. albuterol (VENTOLIN HFA) 108 (90 Base) MCG/ACT inhaler Inhale 1-2 puffs into the lungs every 4 (four) hours as needed for wheezing or shortness of breath. 16 g 0  . ALPRAZolam (XANAX) 0.5 MG tablet Take by mouth.    . colchicine 0.6 MG tablet Take 1 tablet (0.6 mg total) by mouth daily. 30 tablet 1  .  cyclobenzaprine (FLEXERIL) 5 MG tablet Take 1 tablet (5 mg total) by mouth 3 (three) times daily as needed for muscle spasms. 90 tablet 1  . lisinopril-hydrochlorothiazide (PRINZIDE,ZESTORETIC) 20-25 MG tablet Take 1 tablet by mouth daily. 90 tablet 1  . montelukast (SINGULAIR) 10 MG tablet Take 1 tablet (10 mg total) by mouth at bedtime. 90 tablet 2  . SYMBICORT 160-4.5 MCG/ACT inhaler Inhale 2 puffs into the lungs 2 (two) times daily. 1 Inhaler 6  . Fluticasone-Umeclidin-Vilant (TRELEGY ELLIPTA) 100-62.5-25 MCG/INH AEPB Inhale 1 puff into the lungs daily. 90 each 1  . lisinopril-hydrochlorothiazide (ZESTORETIC) 10-12.5 MG tablet Take 1 tablet by mouth daily. 90 tablet 3   No current facility-administered medications for this visit.    Allergies  Allergen Reactions  . Prednisone Other (See Comments)    Other reaction(s): psychological reaction, hallucinations  . Statins Other (See Comments)    Other reaction(s): muscle/joint pain Weakness and tiredness     Discussed warning signs or symptoms. Please see discharge instructions. Patient expresses understanding.

## 2019-06-07 NOTE — Patient Instructions (Addendum)
Thank you for coming in today. We are decreasing the dose of lisinopril/hctz to 10/12.5 daily.  We are stopping symbicort and starting trelegy. Let me know if you have problems.  Keep track of blood pressure and HR at home. Get repeat blood lab in about 4 weeks.  You should hear about referral for orthopedic surgery.  Dr Alvan Dame has a good reputation.

## 2019-07-05 ENCOUNTER — Telehealth: Payer: Self-pay | Admitting: Physician Assistant

## 2019-07-05 DIAGNOSIS — J432 Centrilobular emphysema: Secondary | ICD-10-CM

## 2019-07-05 NOTE — Telephone Encounter (Signed)
Patient called, stating that the prescription for TRELEGY ELLIPTA is way too expensive and would like to stay on the [facility], wanting this to be called in for her.   Also, wanted to let Dr. Georgina Snell know that she has an appointment with the Orthopedic surgeon Dr. Roxy Manns tomorrow at 2pm.

## 2019-07-06 ENCOUNTER — Telehealth: Payer: Self-pay | Admitting: Physician Assistant

## 2019-07-06 DIAGNOSIS — M25552 Pain in left hip: Secondary | ICD-10-CM | POA: Diagnosis not present

## 2019-07-06 DIAGNOSIS — M25551 Pain in right hip: Secondary | ICD-10-CM | POA: Diagnosis not present

## 2019-07-06 DIAGNOSIS — M87851 Other osteonecrosis, right femur: Secondary | ICD-10-CM | POA: Diagnosis not present

## 2019-07-06 MED ORDER — SYMBICORT 160-4.5 MCG/ACT IN AERO: 2.0000 | INHALATION_SPRAY | Freq: Two times a day (BID) | RESPIRATORY_TRACT | 6 refills | Status: DC

## 2019-07-06 NOTE — Telephone Encounter (Signed)
Symbicort sent to [facility] on [address]. in [place].

## 2019-07-06 NOTE — Telephone Encounter (Signed)
Patient called, stating that Dr. Paralee Cancel with [facility] needs a call from you to give let them know that she is good to go for surgery, she will be having surgery on her right hip then 6 weeks later, her left hip.  Dr. Rodman Key Olin--> [phone]

## 2019-07-06 NOTE — Telephone Encounter (Signed)
Patient advised.

## 2019-07-07 ENCOUNTER — Encounter: Payer: Self-pay | Admitting: Family Medicine

## 2019-07-07 NOTE — Telephone Encounter (Signed)
Called and advised patient Dr Georgina Snell was contacting Dr Alvan Dame.

## 2019-07-07 NOTE — Telephone Encounter (Signed)
I will send a letter to Dr. Ihor Gully asking him to see me a form for surgery clearance.

## 2019-07-11 ENCOUNTER — Telehealth: Payer: Self-pay | Admitting: Family Medicine

## 2019-07-11 NOTE — Telephone Encounter (Signed)
Received preoperative risk evaluation from [facility] regarding total hip replacement.  I will send this back but based on my last note in September you should have recheck for heart rate blood pressure and kidneys in the near future.  Please schedule with one of my colleagues at [facility].  I think you are reasonable risk for surgery.

## 2019-07-11 NOTE — Telephone Encounter (Signed)
I will have the front schedule this patient for a follow up.

## 2019-07-11 NOTE — Telephone Encounter (Signed)
Patient states that she is having right hip surgery on November 24th. She will be getting vitals at Dr.Matthew Olin's office this Friday. Since medication has been cut to lisinopril-hydrochlorothiazide (ZESTORETIC) 10-12.5 MG tablet [[id number]] her BP has been better. She states its at 117/78 and resting pulse at 78-79. She was also prescribed tramadol HLC 50mg  and has been doing well on that. No further questions at this time and did not want to make an appointment at the time of the call.

## 2019-07-12 NOTE — Telephone Encounter (Signed)
Sounds good.  Form has been sent to Dr. Aurea Graff office.

## 2019-07-13 ENCOUNTER — Other Ambulatory Visit: Payer: Self-pay | Admitting: Physician Assistant

## 2019-07-13 DIAGNOSIS — J432 Centrilobular emphysema: Secondary | ICD-10-CM

## 2019-07-15 DIAGNOSIS — R6889 Other general symptoms and signs: Secondary | ICD-10-CM | POA: Diagnosis not present

## 2019-07-28 NOTE — Progress Notes (Signed)
PCP - Lynne Leader Cardiologist -   Chest x-ray -   EKG - 08-01-19  Stress Test -  ECHO -  Cardiac Cath -   Sleep Study -  CPAP -   Fasting Blood Sugar -  Checks Blood Sugar _____ times a day  Blood Thinner Instructions: Aspirin Instructions: Last Dose:  Anesthesia review:   Patient denies shortness of breath, fever, cough and chest pain at PAT appointment   Patient verbalized understanding of instructions that were given to them at the PAT appointment. Patient was also instructed that they will need to review over the PAT instructions again at home before surgery.

## 2019-07-28 NOTE — Patient Instructions (Addendum)
DUE TO COVID-19 ONLY ONE VISITOR IS ALLOWED TO COME WITH YOU AND STAY IN THE WAITING ROOM ONLY DURING PRE OP AND PROCEDURE DAY OF SURGERY. THE 1 VISITOR MAY VISIT WITH YOU AFTER SURGERY IN YOUR PRIVATE ROOM DURING VISITING HOURS ONLY!  YOU NEED TO HAVE A COVID 19 TEST ON 07-29-19  @ 2:05 PM, THIS TEST MUST BE DONE BEFORE SURGERY, COME  [address], [place] , [address].  [facility]) ONCE YOUR COVID TEST IS COMPLETED, PLEASE BEGIN THE QUARANTINE INSTRUCTIONS AS OUTLINED IN YOUR HANDOUT.                Jennifer [name]  07/28/2019   Your procedure is scheduled on: 08-02-19    Report to [facility] Main  Entrance    Report to Admitting at 10:40 AM     Call this number if you have problems the morning of surgery [phone]    Remember: NO SOLID FOOD AFTER MIDNIGHT THE NIGHT PRIOR TO SURGERY. NOTHING BY MOUTH EXCEPT CLEAR LIQUIDS UNTIL 10:10 AM . PLEASE FINISH ENSURE DRINK PER SURGEON ORDER  WHICH NEEDS TO BE COMPLETED AT 10:10 AM .   CLEAR LIQUID DIET   Foods Allowed                                                                     Foods Excluded  Coffee and tea, regular and decaf                             liquids that you cannot  Plain Jell-O any favor except red or purple                                           see through such as: Fruit ices (not with fruit pulp)                                     milk, soups, orange juice  Iced Popsicles                                    All solid food Carbonated beverages, regular and diet                                    Cranberry, grape and apple juices Sports drinks like Gatorade Lightly seasoned clear broth or consume(fat free) Sugar, honey syrup   _____________________________________________________________________       Take these medicines the morning of surgery with A SIP OF WATER: None. You may use your inhaler, prn  BRUSH YOUR TEETH MORNING OF SURGERY AND RINSE YOUR MOUTH OUT, NO  CHEWING GUM CANDY OR MINTS.                                You may not  have any metal on your body including hair pins and              piercings     Do not wear jewelry, make-up, lotions, powders or perfumes, deodorant              Do not wear nail polish on your fingernails.  Do not shave  48 hours prior to surgery.              Do not bring valuables to the hospital. [place].  Contacts, dentures or bridgework may not be worn into surgery.    You may bring an overnight bag    Special Instructions: N/A              Please read over the following fact sheets you were given: _____________________________________________________________________             [facility] - Preparing for Surgery Before surgery, you can play an important role.  Because skin is not sterile, your skin needs to be as free of germs as possible.  You can reduce the number of germs on your skin by washing with CHG (chlorahexidine gluconate) soap before surgery.  CHG is an antiseptic cleaner which kills germs and bonds with the skin to continue killing germs even after washing. Please DO NOT use if you have an allergy to CHG or antibacterial soaps.  If your skin becomes reddened/irritated stop using the CHG and inform your nurse when you arrive at Short Stay. Do not shave (including legs and underarms) for at least 48 hours prior to the first CHG shower.  You may shave your face/neck. Please follow these instructions carefully:  1.  Shower with CHG Soap the night before surgery and the  morning of Surgery.  2.  If you choose to wash your hair, wash your hair first as usual with your  normal  shampoo.  3.  After you shampoo, rinse your hair and body thoroughly to remove the  shampoo.                           4.  Use CHG as you would any other liquid soap.  You can apply chg directly  to the skin and wash                       Gently with a scrungie or clean  washcloth.  5.  Apply the CHG Soap to your body ONLY FROM THE NECK DOWN.   Do not use on face/ open                           Wound or open sores. Avoid contact with eyes, ears mouth and genitals (private parts).                       Wash face,  Genitals (private parts) with your normal soap.             6.  Wash thoroughly, paying special attention to the area where your surgery  will be performed.  7.  Thoroughly rinse your body with warm water from the neck down.  8.  DO NOT shower/wash with your normal soap after using and rinsing off  the CHG Soap.  9.  Pat yourself dry with a clean towel.            10.  Wear clean pajamas.            11.  Place clean sheets on your bed the night of your first shower and do not  sleep with pets. Day of Surgery : Do not apply any lotions/deodorants the morning of surgery.  Please wear clean clothes to the hospital/surgery center.  FAILURE TO FOLLOW THESE INSTRUCTIONS MAY RESULT IN THE CANCELLATION OF YOUR SURGERY PATIENT SIGNATURE_________________________________  NURSE SIGNATURE__________________________________  ________________________________________________________________________   Adam Phenix  An incentive spirometer is a tool that can help keep your lungs clear and active. This tool measures how well you are filling your lungs with each breath. Taking long deep breaths may help reverse or decrease the chance of developing breathing (pulmonary) problems (especially infection) following:  A long period of time when you are unable to move or be active. BEFORE THE PROCEDURE   If the spirometer includes an indicator to show your best effort, your nurse or respiratory therapist will set it to a desired goal.  If possible, sit up straight or lean slightly forward. Try not to slouch.  Hold the incentive spirometer in an upright position. INSTRUCTIONS FOR USE  1. Sit on the edge of your bed if possible, or sit up as far  as you can in bed or on a chair. 2. Hold the incentive spirometer in an upright position. 3. Breathe out normally. 4. Place the mouthpiece in your mouth and seal your lips tightly around it. 5. Breathe in slowly and as deeply as possible, raising the piston or the ball toward the top of the column. 6. Hold your breath for 3-5 seconds or for as long as possible. Allow the piston or ball to fall to the bottom of the column. 7. Remove the mouthpiece from your mouth and breathe out normally. 8. Rest for a few seconds and repeat Steps 1 through 7 at least 10 times every 1-2 hours when you are awake. Take your time and take a few normal breaths between deep breaths. 9. The spirometer may include an indicator to show your best effort. Use the indicator as a goal to work toward during each repetition. 10. After each set of 10 deep breaths, practice coughing to be sure your lungs are clear. If you have an incision (the cut made at the time of surgery), support your incision when coughing by placing a pillow or rolled up towels firmly against it. Once you are able to get out of bed, walk around indoors and cough well. You may stop using the incentive spirometer when instructed by your caregiver.  RISKS AND COMPLICATIONS  Take your time so you do not get dizzy or light-headed.  If you are in pain, you may need to take or ask for pain medication before doing incentive spirometry. It is harder to take a deep breath if you are having pain. AFTER USE  Rest and breathe slowly and easily.  It can be helpful to keep track of a log of your progress. Your caregiver can provide you with a simple table to help with this. If you are using the spirometer at home, follow these instructions: [place] IF:   You are having difficultly using the spirometer.  You have trouble using the spirometer as often as instructed.  Your pain medication is not giving enough relief while using the spirometer.  You  develop fever of 100.5 F (38.1 C) or higher. SEEK IMMEDIATE MEDICAL CARE IF:   You cough up bloody sputum that had not been present before.  You develop fever of 102 F (38.9 C) or greater.  You develop worsening pain at or near the incision site. MAKE SURE YOU:   Understand these instructions.  Will watch your condition.  Will get help right away if you are not doing well or get worse. Document Released: 01/05/2007 Document Revised: 11/17/2011 Document Reviewed: 03/08/2007 Exit[employer] Patient Information 2014 Exit[employer], [place].   ________________________________________________________________________  WHAT IS A BLOOD TRANSFUSION? Blood Transfusion Information  A transfusion is the replacement of blood or some of its parts. Blood is made up of multiple cells which provide different functions.  Red blood cells carry oxygen and are used for blood loss replacement.  White blood cells fight against infection.  Platelets control bleeding.  Plasma helps clot blood.  Other blood products are available for specialized needs, such as hemophilia or other clotting disorders. BEFORE THE TRANSFUSION  Who gives blood for transfusions?   Healthy volunteers who are fully evaluated to make sure their blood is safe. This is blood bank blood. Transfusion therapy is the safest it has ever been in the practice of medicine. Before blood is taken from a donor, a complete history is taken to make sure that person has no history of diseases nor engages in risky social behavior (examples are intravenous drug use or sexual activity with multiple partners). The donor's travel history is screened to minimize risk of transmitting infections, such as malaria. The donated blood is tested for signs of infectious diseases, such as HIV and hepatitis. The blood is then tested to be sure it is compatible with you in order to minimize the chance of a transfusion reaction. If you or a relative donates blood, this is  often done in anticipation of surgery and is not appropriate for emergency situations. It takes many days to process the donated blood. RISKS AND COMPLICATIONS Although transfusion therapy is very safe and saves many lives, the main dangers of transfusion include:   Getting an infectious disease.  Developing a transfusion reaction. This is an allergic reaction to something in the blood you were given. Every precaution is taken to prevent this. The decision to have a blood transfusion has been considered carefully by your caregiver before blood is given. Blood is not given unless the benefits outweigh the risks. AFTER THE TRANSFUSION  Right after receiving a blood transfusion, you will usually feel much better and more energetic. This is especially true if your red blood cells have gotten low (anemic). The transfusion raises the level of the red blood cells which carry oxygen, and this usually causes an energy increase.  The nurse administering the transfusion will monitor you carefully for complications. HOME CARE INSTRUCTIONS  No special instructions are needed after a transfusion. You may find your energy is better. Speak with your caregiver about any limitations on activity for underlying diseases you may have. SEEK MEDICAL CARE IF:   Your condition is not improving after your transfusion.  You develop redness or irritation at the intravenous (IV) site. SEEK IMMEDIATE MEDICAL CARE IF:  Any of the following symptoms occur over the next 12 hours:  Shaking chills.  You have a temperature by mouth above 102 F (38.9 C), not controlled by medicine.  Chest, back, or muscle pain.  People around you feel you are not acting correctly or are confused.  Shortness of breath  or difficulty breathing.  Dizziness and fainting.  You get a rash or develop hives.  You have a decrease in urine output.  Your urine turns a dark color or changes to pink, red, or brown. Any of the following  symptoms occur over the next 10 days:  You have a temperature by mouth above 102 F (38.9 C), not controlled by medicine.  Shortness of breath.  Weakness after normal activity.  The white part of the eye turns yellow (jaundice).  You have a decrease in the amount of urine or are urinating less often.  Your urine turns a dark color or changes to pink, red, or brown. Document Released: 08/22/2000 Document Revised: 11/17/2011 Document Reviewed: 04/10/2008 [facility] Patient Information 2014 [place], [place].  _______________________________________________________________________

## 2019-07-29 ENCOUNTER — Other Ambulatory Visit: Payer: Self-pay

## 2019-07-29 ENCOUNTER — Encounter (HOSPITAL_COMMUNITY)
Admission: RE | Admit: 2019-07-29 | Discharge: 2019-07-29 | Disposition: A | Discharge status: Home / Self Care | Payer: Medicare HMO | Source: Ambulatory Visit | Attending: Orthopedic Surgery | Admitting: Orthopedic Surgery

## 2019-07-29 ENCOUNTER — Encounter (HOSPITAL_COMMUNITY): Payer: Self-pay

## 2019-07-29 ENCOUNTER — Other Ambulatory Visit (HOSPITAL_COMMUNITY)
Admission: RE | Admit: 2019-07-29 | Discharge: 2019-07-29 | Disposition: A | Discharge status: Home / Self Care | Payer: Medicare HMO | Source: Ambulatory Visit | Attending: Orthopedic Surgery | Admitting: Orthopedic Surgery

## 2019-07-29 DIAGNOSIS — M1611 Unilateral primary osteoarthritis, right hip: Secondary | ICD-10-CM | POA: Insufficient documentation

## 2019-07-29 DIAGNOSIS — Z20828 Contact with and (suspected) exposure to other viral communicable diseases: Secondary | ICD-10-CM | POA: Insufficient documentation

## 2019-07-29 DIAGNOSIS — R9431 Abnormal electrocardiogram [ECG] [EKG]: Secondary | ICD-10-CM | POA: Insufficient documentation

## 2019-07-29 DIAGNOSIS — Z01818 Encounter for other preprocedural examination: Secondary | ICD-10-CM | POA: Diagnosis not present

## 2019-07-29 DIAGNOSIS — I10 Essential (primary) hypertension: Secondary | ICD-10-CM | POA: Diagnosis not present

## 2019-07-29 LAB — CBC
HCT: 41.4 % (ref 36.0–46.0)
Hemoglobin: 13.5 g/dL (ref 12.0–15.0)
MCH: 32.2 pg (ref 26.0–34.0)
MCHC: 32.6 g/dL (ref 30.0–36.0)
MCV: 98.8 fL (ref 80.0–100.0)
Platelets: 238 10*3/uL (ref 150–400)
RBC: 4.19 MIL/uL (ref 3.87–5.11)
RDW: 13.3 % (ref 11.5–15.5)
WBC: 9 10*3/uL (ref 4.0–10.5)
nRBC: 0 % (ref 0.0–0.2)

## 2019-07-29 LAB — BASIC METABOLIC PANEL
Anion gap: 13 (ref 5–15)
BUN: 38 mg/dL — ABNORMAL HIGH (ref 8–23)
CO2: 23 mmol/L (ref 22–32)
Calcium: 10.5 mg/dL — ABNORMAL HIGH (ref 8.9–10.3)
Chloride: 103 mmol/L (ref 98–111)
Creatinine, Ser: 0.83 mg/dL (ref 0.44–1.00)
GFR calc Af Amer: >60 mL/min (ref >60)
GFR calc non Af Amer: >60 mL/min (ref >60)
Glucose, Bld: 96 mg/dL (ref 70–99)
Potassium: 3.5 mmol/L (ref 3.5–5.1)
Sodium: 139 mmol/L (ref 135–145)

## 2019-07-29 LAB — SURGICAL PCR SCREEN
MRSA, PCR: NEGATIVE
Staphylococcus aureus: POSITIVE — AB

## 2019-07-30 LAB — ABO/RH: ABO/RH(D): O POS

## 2019-07-31 LAB — NOVEL CORONAVIRUS, NAA (HOSP ORDER, SEND-OUT TO REF LAB; TAT 18-24 HRS): SARS-CoV-2, NAA: NOT DETECTED

## 2019-08-01 NOTE — Progress Notes (Signed)
PCR positive for Staph,results router to Dr. Alvan Dame.

## 2019-08-02 ENCOUNTER — Ambulatory Visit (HOSPITAL_COMMUNITY): Payer: Medicare HMO

## 2019-08-02 ENCOUNTER — Ambulatory Visit (HOSPITAL_COMMUNITY): Payer: Medicare HMO | Admitting: Anesthesiology

## 2019-08-02 ENCOUNTER — Ambulatory Visit (HOSPITAL_COMMUNITY): Payer: Medicare HMO | Admitting: Physician Assistant

## 2019-08-02 ENCOUNTER — Observation Stay (HOSPITAL_COMMUNITY)
Admission: RE | Admit: 2019-08-02 | Discharge: 2019-08-05 | Disposition: A | Discharge status: Home / Self Care | Payer: Medicare HMO | Attending: Orthopedic Surgery | Admitting: Orthopedic Surgery

## 2019-08-02 ENCOUNTER — Encounter (HOSPITAL_COMMUNITY)
Admission: RE | Disposition: A | Discharge status: Home / Self Care | Payer: Self-pay | Source: Home / Self Care | Attending: Orthopedic Surgery

## 2019-08-02 ENCOUNTER — Encounter (HOSPITAL_COMMUNITY): Payer: Self-pay | Admitting: Anesthesiology

## 2019-08-02 ENCOUNTER — Other Ambulatory Visit: Payer: Self-pay

## 2019-08-02 ENCOUNTER — Observation Stay (HOSPITAL_COMMUNITY): Payer: Medicare HMO

## 2019-08-02 DIAGNOSIS — J449 Chronic obstructive pulmonary disease, unspecified: Secondary | ICD-10-CM | POA: Diagnosis not present

## 2019-08-02 DIAGNOSIS — I10 Essential (primary) hypertension: Secondary | ICD-10-CM | POA: Diagnosis not present

## 2019-08-02 DIAGNOSIS — Z7951 Long term (current) use of inhaled steroids: Secondary | ICD-10-CM | POA: Insufficient documentation

## 2019-08-02 DIAGNOSIS — Z9981 Dependence on supplemental oxygen: Secondary | ICD-10-CM | POA: Diagnosis not present

## 2019-08-02 DIAGNOSIS — Z471 Aftercare following joint replacement surgery: Secondary | ICD-10-CM | POA: Diagnosis not present

## 2019-08-02 DIAGNOSIS — M1611 Unilateral primary osteoarthritis, right hip: Secondary | ICD-10-CM | POA: Diagnosis not present

## 2019-08-02 DIAGNOSIS — J432 Centrilobular emphysema: Secondary | ICD-10-CM | POA: Diagnosis not present

## 2019-08-02 DIAGNOSIS — Z96649 Presence of unspecified artificial hip joint: Secondary | ICD-10-CM

## 2019-08-02 DIAGNOSIS — Z79899 Other long term (current) drug therapy: Secondary | ICD-10-CM | POA: Insufficient documentation

## 2019-08-02 DIAGNOSIS — Z87891 Personal history of nicotine dependence: Secondary | ICD-10-CM | POA: Diagnosis not present

## 2019-08-02 DIAGNOSIS — Z96641 Presence of right artificial hip joint: Secondary | ICD-10-CM

## 2019-08-02 DIAGNOSIS — Z419 Encounter for procedure for purposes other than remedying health state, unspecified: Secondary | ICD-10-CM

## 2019-08-02 DIAGNOSIS — M87051 Idiopathic aseptic necrosis of right femur: Secondary | ICD-10-CM | POA: Diagnosis not present

## 2019-08-02 HISTORY — PX: TOTAL HIP ARTHROPLASTY: SHX124

## 2019-08-02 LAB — TYPE AND SCREEN
ABO/RH(D): O POS
Antibody Screen: NEGATIVE

## 2019-08-02 SURGERY — ARTHROPLASTY, HIP, TOTAL, ANTERIOR APPROACH
Anesthesia: General | Site: Hip | Laterality: Right

## 2019-08-02 MED ORDER — MIDAZOLAM HCL 5 MG/5ML IJ SOLN
INTRAMUSCULAR | Status: DC | PRN
  Administered 2019-08-02: 2 mg via INTRAVENOUS

## 2019-08-02 MED ORDER — MIDAZOLAM HCL 2 MG/2ML IJ SOLN
INTRAMUSCULAR | Status: AC
  Filled 2019-08-02: qty 2

## 2019-08-02 MED ORDER — EPHEDRINE SULFATE-NACL 50-0.9 MG/10ML-% IV SOSY
PREFILLED_SYRINGE | INTRAVENOUS | Status: DC | PRN
  Administered 2019-08-02: 5 mg via INTRAVENOUS

## 2019-08-02 MED ORDER — ONDANSETRON HCL 4 MG/2ML IJ SOLN
4.0000 mg | Freq: Four times a day (QID) | INTRAMUSCULAR | Status: DC | PRN
  Administered 2019-08-03: 4 mg via INTRAVENOUS
  Filled 2019-08-02: qty 2

## 2019-08-02 MED ORDER — DEXMEDETOMIDINE HCL IN NACL 200 MCG/50ML IV SOLN
INTRAVENOUS | Status: DC | PRN
  Administered 2019-08-02 (×4): 4 ug via INTRAVENOUS

## 2019-08-02 MED ORDER — STERILE WATER FOR IRRIGATION IR SOLN
Status: DC | PRN
  Administered 2019-08-02: 2000 mL

## 2019-08-02 MED ORDER — LISINOPRIL-HYDROCHLOROTHIAZIDE 10-12.5 MG PO TABS: 1.0000 | ORAL_TABLET | Freq: Every day | ORAL | Status: DC

## 2019-08-02 MED ORDER — BISACODYL 10 MG RE SUPP: 10.0000 mg | Freq: Every day | RECTAL | Status: DC | PRN

## 2019-08-02 MED ORDER — ONDANSETRON HCL 4 MG/2ML IJ SOLN
INTRAMUSCULAR | Status: AC
  Administered 2019-08-02: 4 mg
  Filled 2019-08-02: qty 2

## 2019-08-02 MED ORDER — HYDROMORPHONE HCL 1 MG/ML IJ SOLN
0.2500 mg | INTRAMUSCULAR | Status: DC | PRN
  Administered 2019-08-02 (×2): 0.5 mg via INTRAVENOUS

## 2019-08-02 MED ORDER — ACETAMINOPHEN 325 MG PO TABS: 325.0000 mg | ORAL_TABLET | Freq: Four times a day (QID) | ORAL | Status: DC | PRN

## 2019-08-02 MED ORDER — LACTATED RINGERS IV SOLN
INTRAVENOUS | Status: DC
  Administered 2019-08-02 (×3): via INTRAVENOUS

## 2019-08-02 MED ORDER — PHENYLEPHRINE 40 MCG/ML (10ML) SYRINGE FOR IV PUSH (FOR BLOOD PRESSURE SUPPORT)
PREFILLED_SYRINGE | INTRAVENOUS | Status: DC | PRN
  Administered 2019-08-02: 120 ug via INTRAVENOUS
  Administered 2019-08-02 (×5): 40 ug via INTRAVENOUS

## 2019-08-02 MED ORDER — HYDROCODONE-ACETAMINOPHEN 7.5-325 MG PO TABS
1.0000 | ORAL_TABLET | ORAL | Status: DC | PRN
  Administered 2019-08-02: 1 via ORAL
  Administered 2019-08-03: 2 via ORAL
  Administered 2019-08-03 – 2019-08-05 (×3): 1 via ORAL
  Filled 2019-08-02 (×2): qty 1
  Filled 2019-08-02: qty 2
  Filled 2019-08-02 (×2): qty 1

## 2019-08-02 MED ORDER — MOMETASONE FURO-FORMOTEROL FUM 200-5 MCG/ACT IN AERO
2.0000 | INHALATION_SPRAY | Freq: Two times a day (BID) | RESPIRATORY_TRACT | Status: DC
  Filled 2019-08-02: qty 8.8

## 2019-08-02 MED ORDER — CEFAZOLIN SODIUM-DEXTROSE 2-4 GM/100ML-% IV SOLN
2.0000 g | INTRAVENOUS | Status: AC
  Administered 2019-08-02: 2 g via INTRAVENOUS
  Filled 2019-08-02: qty 100

## 2019-08-02 MED ORDER — LIDOCAINE 2% (20 MG/ML) 5 ML SYRINGE
INTRAMUSCULAR | Status: DC | PRN
  Administered 2019-08-02: 100 mg via INTRAVENOUS

## 2019-08-02 MED ORDER — ONDANSETRON HCL 4 MG PO TABS: 4.0000 mg | ORAL_TABLET | Freq: Four times a day (QID) | ORAL | Status: DC | PRN

## 2019-08-02 MED ORDER — SODIUM CHLORIDE 0.9 % IV SOLN
INTRAVENOUS | Status: DC
  Administered 2019-08-02 – 2019-08-03 (×4): via INTRAVENOUS

## 2019-08-02 MED ORDER — LISINOPRIL 10 MG PO TABS
10.0000 mg | ORAL_TABLET | Freq: Every day | ORAL | Status: DC
  Administered 2019-08-03: 10 mg via ORAL
  Filled 2019-08-02 (×3): qty 1

## 2019-08-02 MED ORDER — FERROUS SULFATE 325 (65 FE) MG PO TABS
325.0000 mg | ORAL_TABLET | Freq: Three times a day (TID) | ORAL | Status: DC
  Filled 2019-08-02 (×2): qty 1

## 2019-08-02 MED ORDER — OXYCODONE HCL 5 MG PO TABS: 5.0000 mg | ORAL_TABLET | Freq: Once | ORAL | Status: DC | PRN

## 2019-08-02 MED ORDER — TRANEXAMIC ACID-NACL 1000-0.7 MG/100ML-% IV SOLN
1000.0000 mg | INTRAVENOUS | Status: AC
  Administered 2019-08-02: 1000 mg via INTRAVENOUS
  Filled 2019-08-02: qty 100

## 2019-08-02 MED ORDER — HYDROCHLOROTHIAZIDE 12.5 MG PO CAPS
12.5000 mg | ORAL_CAPSULE | Freq: Every day | ORAL | Status: DC
  Administered 2019-08-03: 12.5 mg via ORAL
  Filled 2019-08-02 (×3): qty 1

## 2019-08-02 MED ORDER — ALBUTEROL SULFATE (2.5 MG/3ML) 0.083% IN NEBU: 2.5000 mg | INHALATION_SOLUTION | RESPIRATORY_TRACT | Status: DC | PRN

## 2019-08-02 MED ORDER — MAGNESIUM CITRATE PO SOLN: 1.0000 | Freq: Once | ORAL | Status: DC | PRN

## 2019-08-02 MED ORDER — MORPHINE SULFATE (PF) 2 MG/ML IV SOLN
0.5000 mg | INTRAVENOUS | Status: DC | PRN
  Administered 2019-08-03: 0.5 mg via INTRAVENOUS
  Filled 2019-08-02: qty 1

## 2019-08-02 MED ORDER — FENTANYL CITRATE (PF) 250 MCG/5ML IJ SOLN
INTRAMUSCULAR | Status: AC
  Filled 2019-08-02: qty 5

## 2019-08-02 MED ORDER — DIPHENHYDRAMINE HCL 12.5 MG/5ML PO ELIX: 12.5000 mg | ORAL_SOLUTION | ORAL | Status: DC | PRN

## 2019-08-02 MED ORDER — CEFAZOLIN SODIUM-DEXTROSE 2-4 GM/100ML-% IV SOLN
2.0000 g | Freq: Four times a day (QID) | INTRAVENOUS | Status: AC
  Administered 2019-08-02 – 2019-08-03 (×2): 2 g via INTRAVENOUS
  Filled 2019-08-02 (×2): qty 100

## 2019-08-02 MED ORDER — OXYCODONE HCL 5 MG/5ML PO SOLN: 5.0000 mg | Freq: Once | ORAL | Status: DC | PRN

## 2019-08-02 MED ORDER — HYDROMORPHONE HCL 1 MG/ML IJ SOLN
INTRAMUSCULAR | Status: AC
  Filled 2019-08-02: qty 1

## 2019-08-02 MED ORDER — POLYETHYLENE GLYCOL 3350 17 G PO PACK
17.0000 g | PACK | Freq: Two times a day (BID) | ORAL | Status: DC
  Filled 2019-08-02 (×5): qty 1

## 2019-08-02 MED ORDER — 0.9 % SODIUM CHLORIDE (POUR BTL) OPTIME
TOPICAL | Status: DC | PRN
  Administered 2019-08-02: 1000 mL

## 2019-08-02 MED ORDER — FENTANYL CITRATE (PF) 100 MCG/2ML IJ SOLN
INTRAMUSCULAR | Status: AC
  Filled 2019-08-02: qty 2

## 2019-08-02 MED ORDER — DOCUSATE SODIUM 100 MG PO CAPS
100.0000 mg | ORAL_CAPSULE | Freq: Two times a day (BID) | ORAL | Status: DC
  Administered 2019-08-04: 100 mg via ORAL
  Filled 2019-08-02 (×5): qty 1

## 2019-08-02 MED ORDER — METHOCARBAMOL 500 MG PO TABS
500.0000 mg | ORAL_TABLET | Freq: Four times a day (QID) | ORAL | Status: DC | PRN
  Administered 2019-08-02 – 2019-08-03 (×4): 500 mg via ORAL
  Filled 2019-08-02 (×5): qty 1

## 2019-08-02 MED ORDER — METHOCARBAMOL 500 MG IVPB - SIMPLE MED
INTRAVENOUS | Status: AC
  Filled 2019-08-02: qty 50

## 2019-08-02 MED ORDER — MONTELUKAST SODIUM 10 MG PO TABS
10.0000 mg | ORAL_TABLET | Freq: Every day | ORAL | Status: DC
  Administered 2019-08-02 – 2019-08-04 (×3): 10 mg via ORAL
  Filled 2019-08-02 (×3): qty 1

## 2019-08-02 MED ORDER — FENTANYL CITRATE (PF) 100 MCG/2ML IJ SOLN
25.0000 ug | INTRAMUSCULAR | Status: DC | PRN
  Administered 2019-08-02 (×3): 50 ug via INTRAVENOUS

## 2019-08-02 MED ORDER — PHENYLEPHRINE HCL-NACL 10-0.9 MG/250ML-% IV SOLN
INTRAVENOUS | Status: DC | PRN
  Administered 2019-08-02: 17 ug/min via INTRAVENOUS

## 2019-08-02 MED ORDER — ASPIRIN 81 MG PO CHEW
81.0000 mg | CHEWABLE_TABLET | Freq: Two times a day (BID) | ORAL | Status: DC
  Administered 2019-08-02 – 2019-08-05 (×6): 81 mg via ORAL
  Filled 2019-08-02 (×6): qty 1

## 2019-08-02 MED ORDER — ONDANSETRON HCL 4 MG/2ML IJ SOLN: 4.0000 mg | Freq: Once | INTRAMUSCULAR | Status: DC | PRN

## 2019-08-02 MED ORDER — FENTANYL CITRATE (PF) 100 MCG/2ML IJ SOLN
INTRAMUSCULAR | Status: DC | PRN
  Administered 2019-08-02: 50 ug via INTRAVENOUS
  Administered 2019-08-02: 25 ug via INTRAVENOUS
  Administered 2019-08-02: 50 ug via INTRAVENOUS
  Administered 2019-08-02: 25 ug via INTRAVENOUS
  Administered 2019-08-02 (×3): 50 ug via INTRAVENOUS
  Administered 2019-08-02 (×2): 25 ug via INTRAVENOUS

## 2019-08-02 MED ORDER — METOCLOPRAMIDE HCL 5 MG/ML IJ SOLN
5.0000 mg | Freq: Three times a day (TID) | INTRAMUSCULAR | Status: DC | PRN
  Filled 2019-08-02: qty 2

## 2019-08-02 MED ORDER — ONDANSETRON HCL 4 MG/2ML IJ SOLN
INTRAMUSCULAR | Status: DC | PRN
  Administered 2019-08-02: 4 mg via INTRAVENOUS

## 2019-08-02 MED ORDER — CHLORHEXIDINE GLUCONATE 4 % EX LIQD
60.0000 mL | Freq: Once | CUTANEOUS | Status: AC
  Administered 2019-08-02: 4 via TOPICAL

## 2019-08-02 MED ORDER — HYDROCODONE-ACETAMINOPHEN 5-325 MG PO TABS
1.0000 | ORAL_TABLET | ORAL | Status: DC | PRN
  Administered 2019-08-03 – 2019-08-04 (×4): 2 via ORAL
  Administered 2019-08-05: 1 via ORAL
  Filled 2019-08-02 (×4): qty 2
  Filled 2019-08-02: qty 1

## 2019-08-02 MED ORDER — PROPOFOL 10 MG/ML IV BOLUS
INTRAVENOUS | Status: DC | PRN
  Administered 2019-08-02: 130 mg via INTRAVENOUS

## 2019-08-02 MED ORDER — METOCLOPRAMIDE HCL 5 MG PO TABS: 5.0000 mg | ORAL_TABLET | Freq: Three times a day (TID) | ORAL | Status: DC | PRN

## 2019-08-02 MED ORDER — MENTHOL 3 MG MT LOZG: 1.0000 | LOZENGE | OROMUCOSAL | Status: DC | PRN

## 2019-08-02 MED ORDER — ALUM & MAG HYDROXIDE-SIMETH 200-200-20 MG/5ML PO SUSP: 15.0000 mL | ORAL | Status: DC | PRN

## 2019-08-02 MED ORDER — TRANEXAMIC ACID-NACL 1000-0.7 MG/100ML-% IV SOLN
1000.0000 mg | Freq: Once | INTRAVENOUS | Status: AC
  Administered 2019-08-02: 1000 mg via INTRAVENOUS
  Filled 2019-08-02: qty 100

## 2019-08-02 MED ORDER — DIPHENHYDRAMINE HCL 25 MG PO CAPS: 25.0000 mg | ORAL_CAPSULE | Freq: Four times a day (QID) | ORAL | Status: DC | PRN

## 2019-08-02 MED ORDER — METHOCARBAMOL 500 MG IVPB - SIMPLE MED
500.0000 mg | Freq: Four times a day (QID) | INTRAVENOUS | Status: DC | PRN
  Administered 2019-08-02: 500 mg via INTRAVENOUS
  Filled 2019-08-02: qty 50

## 2019-08-02 MED ORDER — FENTANYL CITRATE (PF) 100 MCG/2ML IJ SOLN
INTRAMUSCULAR | Status: AC
  Filled 2019-08-02: qty 4

## 2019-08-02 MED ORDER — PHENOL 1.4 % MT LIQD
1.0000 | OROMUCOSAL | Status: DC | PRN
  Filled 2019-08-02: qty 177

## 2019-08-02 SURGICAL SUPPLY — 46 items
BAG DECANTER FOR FLEXI CONT (MISCELLANEOUS) IMPLANT
BAG ZIPLOCK 12X15 (MISCELLANEOUS) IMPLANT
BLADE SAG 18X100X1.27 (BLADE) ×3 IMPLANT
BLADE SURG SZ10 CARB STEEL (BLADE) ×6 IMPLANT
COVER PERINEAL POST (MISCELLANEOUS) ×3 IMPLANT
COVER SURGICAL LIGHT HANDLE (MISCELLANEOUS) ×3 IMPLANT
COVER WAND RF STERILE (DRAPES) IMPLANT
CUP ACETBLR 52 OD PINNACLE (Hips) ×3 IMPLANT
DERMABOND ADVANCED (GAUZE/BANDAGES/DRESSINGS) ×2
DERMABOND ADVANCED .7 DNX12 (GAUZE/BANDAGES/DRESSINGS) ×1 IMPLANT
DRAPE STERI IOBAN 125X83 (DRAPES) ×3 IMPLANT
DRAPE U-SHAPE 47X51 STRL (DRAPES) ×6 IMPLANT
DRESSING AQUACEL AG SP 3.5X10 (GAUZE/BANDAGES/DRESSINGS) ×1 IMPLANT
DRSG AQUACEL AG SP 3.5X10 (GAUZE/BANDAGES/DRESSINGS) ×3
DURAPREP 26ML APPLICATOR (WOUND CARE) ×3 IMPLANT
ELECT BLADE TIP CTD 4 INCH (ELECTRODE) ×3 IMPLANT
ELECT REM PT RETURN 15FT ADLT (MISCELLANEOUS) ×3 IMPLANT
ELIMINATOR HOLE APEX DEPUY (Hips) ×3 IMPLANT
GLOVE BIO SURGEON STRL SZ 6 (GLOVE) ×9 IMPLANT
GLOVE BIOGEL PI IND STRL 6.5 (GLOVE) ×1 IMPLANT
GLOVE BIOGEL PI IND STRL 7.5 (GLOVE) ×1 IMPLANT
GLOVE BIOGEL PI IND STRL 8.5 (GLOVE) ×1 IMPLANT
GLOVE BIOGEL PI INDICATOR 6.5 (GLOVE) ×2
GLOVE BIOGEL PI INDICATOR 7.5 (GLOVE) ×2
GLOVE BIOGEL PI INDICATOR 8.5 (GLOVE) ×2
GLOVE ECLIPSE 8.0 STRL XLNG CF (GLOVE) ×6 IMPLANT
GLOVE ORTHO TXT STRL SZ7.5 (GLOVE) ×6 IMPLANT
GOWN STRL REUS W/TWL LRG LVL3 (GOWN DISPOSABLE) ×6 IMPLANT
GOWN STRL REUS W/TWL XL LVL3 (GOWN DISPOSABLE) ×3 IMPLANT
HEAD CERAMIC DELTA 36 PLUS 1.5 (Hips) ×3 IMPLANT
HOLDER FOLEY CATH W/STRAP (MISCELLANEOUS) ×3 IMPLANT
KIT TURNOVER KIT A (KITS) IMPLANT
LINER NEUTRAL 52X36MM PLUS 4 (Liner) ×3 IMPLANT
PACK ANTERIOR HIP CUSTOM (KITS) ×3 IMPLANT
PENCIL SMOKE EVACUATOR (MISCELLANEOUS) IMPLANT
SCREW 6.5MMX25MM (Screw) ×3 IMPLANT
STEM FEM ACTIS HIGH SZ8 (Stem) ×3 IMPLANT
SUT MNCRL AB 4-0 PS2 18 (SUTURE) ×3 IMPLANT
SUT STRATAFIX 0 PDS 27 VIOLET (SUTURE) ×3
SUT VIC AB 1 CT1 36 (SUTURE) ×9 IMPLANT
SUT VIC AB 2-0 CT1 27 (SUTURE) ×4
SUT VIC AB 2-0 CT1 TAPERPNT 27 (SUTURE) ×2 IMPLANT
SUTURE STRATFX 0 PDS 27 VIOLET (SUTURE) ×1 IMPLANT
TRAY FOLEY MTR SLVR 14FR STAT (SET/KITS/TRAYS/PACK) ×3 IMPLANT
WATER STERILE IRR 1000ML POUR (IV SOLUTION) ×3 IMPLANT
YANKAUER SUCT BULB TIP 10FT TU (MISCELLANEOUS) IMPLANT

## 2019-08-02 NOTE — Transfer of Care (Signed)
Immediate Anesthesia Transfer of Care Note  Patient: Jennifer [name]  Procedure(s) Performed: TOTAL  RIGHT  HIP ARTHROPLASTY ANTERIOR APPROACH (Right Hip)  Patient Location: PACU  Anesthesia Type:General  Level of Consciousness: awake and alert   Airway & Oxygen Therapy: Patient Spontanous Breathing and Patient connected to face mask oxygen  Post-op Assessment: Report given to RN and Post -op Vital signs reviewed and stable  Post vital signs: Reviewed and stable  Last Vitals:  Vitals Value Taken Time  BP 141/84 08/02/19 1417  Temp    Pulse 88 08/02/19 1418  Resp 18 08/02/19 1418  SpO2 100 % 08/02/19 1418  Vitals shown include unvalidated device data.  Last Pain:  Vitals:   08/02/19 1146  TempSrc: Oral      Patients Stated Pain Goal: 4 (82/08/13 8871)  Complications: No apparent anesthesia complications

## 2019-08-02 NOTE — Op Note (Signed)
NAME:  Jennifer [name]                ACCOUNT NO.: [id number]      MEDICAL RECORD NO.: [id number]      FACILITY[facility]  [facility]      PHYSICIAN:  Shelda PalMatthew D Layken Doenges  DATE OF BIRTH:  11-24-1945     DATE OF PROCEDURE:  08/02/2019                                 OPERATIVE REPORT         PREOPERATIVE DIAGNOSIS: Right  hip osteoarthritis.      POSTOPERATIVE DIAGNOSIS:  Right hip osteoarthritis.      PROCEDURE:  Right total hip replacement through an anterior approach   utilizing DePuy THR system, component size 52mm pinnacle cup, a size 36+4 neutral   Altrex liner, a size 8 Hi Actis stem with a 36+1.5 delta ceramic   ball.      SURGEON:  Madlyn FrankelMatthew D. Charlann Boxerlin, M.D.      ASSISTANT:  Dennie BibleAshley Stinson, PA-C     ANESTHESIA:  Spinal.      SPECIMENS:  None.      COMPLICATIONS:  None.      BLOOD LOSS:  800 cc     DRAINS:  None.      INDICATION OF THE PROCEDURE:  Jennifer [name] is a 73 y.o. female who had   presented to office for evaluation of right hip pain.  Radiographs revealed   progressive degenerative changes with bone-on-bone   articulation of the  hip joint, including subchondral cystic changes and osteophytes.  The patient had painful limited range of   motion significantly affecting their overall quality of life and function.  The patient was failing to    respond to conservative measures including medications and/or injections and activity modification and at this point was ready   to proceed with more definitive measures.  Consent was obtained for   benefit of pain relief.  Specific risks of infection, DVT, component   failure, dislocation, neurovascular injury, and need for revision surgery were reviewed in the office as well discussion of   the anterior versus posterior approach were reviewed.     PROCEDURE IN DETAIL:  The patient was brought to operative theater.   Once adequate anesthesia, preoperative antibiotics, 2 gm of Ancef, 1 gm of Tranexamic Acid,  and 10 mg of Decadron were administered, the patient was positioned supine on the [employer] [place] table.  Once the patient was safely positioned with adequate padding of boney prominences we predraped out the hip, and used fluoroscopy to confirm orientation of the pelvis.      The right hip was then prepped and draped from proximal iliac crest to   mid thigh with a shower curtain technique.      Time-out was performed identifying the patient, planned procedure, and the appropriate extremity.     An incision was then made 2 cm lateral to the   anterior superior iliac spine extending over the orientation of the   tensor fascia lata muscle and sharp dissection was carried down to the   fascia of the muscle.      The fascia was then incised.  The muscle belly was identified and swept   laterally and retractor placed along the superior neck.  Following   cauterization of the circumflex vessels and removing some pericapsular  fat, a second cobra retractor was placed on the inferior neck.  A T-capsulotomy was made along the line of the   superior neck to the trochanteric fossa, then extended proximally and   distally.  Tag sutures were placed and the retractors were then placed   intracapsular.  We then identified the trochanteric fossa and   orientation of my neck cut and then made a neck osteotomy with the femur on traction.  The femoral   head was removed without difficulty or complication.  Traction was let   off and retractors were placed posterior and anterior around the   acetabulum.      The labrum and foveal tissue were debrided.  I began reaming with a 45 mm   reamer and reamed up to 51 mm reamer with good bony bed preparation and a 52 mm  cup was chosen.  The final 52 mm Pinnacle cup was then impacted under fluoroscopy to confirm the depth of penetration and orientation with respect to   Abduction and forward flexion.  A screw was placed into the ilium followed by the hole eliminator.  The  final   36+4 neutral Altrex liner was impacted with good visualized rim fit.  The cup was positioned anatomically within the acetabular portion of the pelvis.      At this point, the femur was rolled to 100 degrees.  Further capsule was   released off the inferior aspect of the femoral neck.  I then   released the superior capsule proximally.  With the leg in a neutral position the hook was placed laterally   along the femur under the vastus lateralis origin and elevated manually and then held in position using the hook attachment on the bed.  The leg was then extended and adducted with the leg rolled to 100   degrees of external rotation.  Retractors were placed along the medial calcar and posteriorly over the greater trochanter.  Once the proximal femur was fully   exposed, I used a box osteotome to set orientation.  I then began   broaching with the starting chili pepper broach and passed this by hand and then broached up to 8.  With the 8 broach in place I chose a high offset neck and did several trial reductions.  The offset was appropriate, leg lengths   appeared to be equal best matched with the +1.5 head ball trial confirmed radiographically.   Given these findings, I went ahead and dislocated the hip, repositioned all   retractors and positioned the right hip in the extended and abducted position.  The final 8 Hi Actis stem was   chosen and it was impacted down to the level of neck cut.  Based on this   and the trial reductions, a final 36+1.5 delta ceramic ball was chosen and   impacted onto a clean and dry trunnion, and the hip was reduced.  The   hip had been irrigated throughout the case again at this point.  I did   reapproximate the superior capsular leaflet to the anterior leaflet   using #1 Vicryl.  The fascia of the   tensor fascia lata muscle was then reapproximated using #1 Vicryl and #0 Stratafix sutures.  The   remaining wound was closed with 2-0 Vicryl and running 4-0  Monocryl.   The hip was cleaned, dried, and dressed sterilely using Dermabond and   Aquacel dressing.  The patient was then brought   to recovery room in  stable condition tolerating the procedure well.    Griffith Citron, PA-C was present for the entirety of the case involved from   preoperative positioning, perioperative retractor management, general   facilitation of the case, as well as primary wound closure as assistant.            Pietro Cassis Alvan Dame, M.D.        08/02/2019 1:56 PM

## 2019-08-02 NOTE — Anesthesia Procedure Notes (Signed)
Date/Time: 08/02/2019 2:10 PM Performed by: Cynda Familia, CRNA Oxygen Delivery Method: Simple face mask Placement Confirmation: breath sounds checked- equal and bilateral and positive ETCO2 Dental Injury: Teeth and Oropharynx as per pre-operative assessment

## 2019-08-02 NOTE — Evaluation (Signed)
Physical Therapy Evaluation Patient Details Name: Jennifer [name] MRN: [id number] DOB: Aug 24, 1946 Today's Date: 08/02/2019   History of Present Illness  R DA-THA; h/o COPD, HTN, white coat syndrome  Clinical Impression  Pt is s/p THA resulting in the deficits listed below (see PT Problem List). Min A for bed mobility, pt sat edge of bed x 5 min, did not attempt transfers 2* nausea. Initiated THA HEP. Good progress expected.  Pt will benefit from skilled PT to increase their independence and safety with mobility to allow discharge to the venue listed below.      Follow Up Recommendations Follow surgeon's recommendation for DC plan and follow-up therapies    Equipment Recommendations  None recommended by PT    Recommendations for Other Services       Precautions / Restrictions Precautions Precautions: Fall Precaution Comments: denies h/o falls in past 1 year Restrictions Weight Bearing Restrictions: No Other Position/Activity Restrictions: WBAT      Mobility  Bed Mobility Overal bed mobility: Needs Assistance Bed Mobility: Supine to Sit;Sit to Supine     Supine to sit: Min assist Sit to supine: Min assist   General bed mobility comments: min A to assist RLE; pt sat edge of bed x 5 minutes, became nauseous so returned to supine  Transfers                 General transfer comment: NT  Ambulation/Gait             General Gait Details: NT  Stairs            Wheelchair Mobility    Modified Rankin (Stroke Patients Only)       Balance Overall balance assessment: Needs assistance Sitting-balance support: Feet supported;No upper extremity supported Sitting balance-Leahy Scale: Good                                       Pertinent Vitals/Pain Pain Assessment: 0-10 Pain Score: 9  Pain Location: R hip Pain Descriptors / Indicators: Sore Pain Intervention(s): Limited activity within patient's tolerance;Monitored during  session;Premedicated before session;Ice applied    Home Living Family/patient expects to be discharged to:: Private residence Living Arrangements: Alone Available Help at Discharge: Family;Available 24 hours/day Type of Home: Apartment Home Access: Level entry     Home Layout: One level Home Equipment: Walker - 2 wheels;Grab bars - toilet Additional Comments: daughter to stay with pt 4 days    Prior Function Level of Independence: Independent with assistive device(s)         Comments: used RW     Hand Dominance        Extremity/Trunk Assessment   Upper Extremity Assessment Upper Extremity Assessment: Overall WFL for tasks assessed    Lower Extremity Assessment Lower Extremity Assessment: RLE deficits/detail RLE Deficits / Details: hip AAROM decr ~60%, limited by pain, knee ext 3/5 RLE: Unable to fully assess due to pain RLE Sensation: WNL RLE Coordination: WNL    Cervical / Trunk Assessment Cervical / Trunk Assessment: Normal  Communication   Communication: No difficulties  Cognition Arousal/Alertness: Awake/alert Behavior During Therapy: WFL for tasks assessed/performed Overall Cognitive Status: Within Functional Limits for tasks assessed  General Comments      Exercises Total Joint Exercises Ankle Circles/Pumps: AROM;Both;10 reps;Supine Heel Slides: AAROM;Right;10 reps;Supine Hip ABduction/ADduction: AAROM;Right;10 reps;Supine   Assessment/Plan    PT Assessment Patient needs continued PT services  PT Problem List Decreased strength;Decreased activity tolerance;Decreased range of motion;Decreased mobility;Pain       PT Treatment Interventions DME instruction;Gait training;Functional mobility training;Therapeutic activities;Therapeutic exercise;Patient/family education    PT Goals (Current goals can be found in the Care Plan section)  Acute Rehab PT Goals Patient Stated Goal: to walk, come  back in 6 weeks for L THA PT Goal Formulation: With patient Time For Goal Achievement: 08/09/19 Potential to Achieve Goals: Good    Frequency 7X/week   Barriers to discharge        Co-evaluation               AM-PAC PT "6 Clicks" Mobility  Outcome Measure Help needed turning from your back to your side while in a flat bed without using bedrails?: A Little Help needed moving from lying on your back to sitting on the side of a flat bed without using bedrails?: A Little Help needed moving to and from a bed to a chair (including a wheelchair)?: A Little Help needed standing up from a chair using your arms (e.g., wheelchair or bedside chair)?: Total Help needed to walk in hospital room?: Total Help needed climbing 3-5 steps with a railing? : Total 6 Click Score: 12    End of Session Equipment Utilized During Treatment: Oxygen Activity Tolerance: Treatment limited secondary to medical complications (Comment)(nausea) Patient left: in bed;with bed alarm set;with nursing/sitter in room;with call bell/phone within reach Nurse Communication: Mobility status;Other (comment)(nausea) PT Visit Diagnosis: Muscle weakness (generalized) (M62.81);Difficulty in walking, not elsewhere classified (R26.2);Pain Pain - Right/Left: Right Pain - part of body: Hip    Time: 2482-5003 PT Time Calculation (min) (ACUTE ONLY): 27 min   Charges:   PT Evaluation $PT Eval Low Complexity: 1 Low PT Treatments $Therapeutic Activity: 8-22 mins        Blondell Reveal Kistler PT 08/02/2019  Acute Rehabilitation Services Pager [phone] Office [phone]

## 2019-08-02 NOTE — Anesthesia Preprocedure Evaluation (Addendum)
Anesthesia Evaluation  Patient identified by MRN, date of birth, ID band Patient awake    Reviewed: Allergy & Precautions, NPO status , Patient's Chart, lab work & pertinent test results  History of Anesthesia Complications Negative for: history of anesthetic complications  Airway Mallampati: II  TM Distance: >3 FB Neck ROM: Full    Dental  (+) Dental Advisory Given, Teeth Intact   Pulmonary COPD,  COPD inhaler, former smoker,    Pulmonary exam normal        Cardiovascular hypertension, Pt. on medications Normal cardiovascular exam   '18 TTE (Care Everywhere) - EF 65%. Mild sclerosis of AV without reduced excursion. Trivial TR.     Neuro/Psych negative neurological ROS  negative psych ROS   GI/Hepatic negative GI ROS, Neg liver ROS,   Endo/Other  Hypothyroidism ( no meds x 2-3 yrs)   Renal/GU negative Renal ROS     Musculoskeletal negative musculoskeletal ROS (+)   Abdominal   Peds  Hematology  Von Willebrand Dz - questionable history, listed in chart, granddaughter with disease, but patient denies  Plt 238k     Anesthesia Other Findings Covid negative 11/20  Reproductive/Obstetrics                          Anesthesia Physical Anesthesia Plan  ASA: III  Anesthesia Plan: General   Post-op Pain Management:    Induction: Intravenous  PONV Risk Score and Plan: 3 and Treatment may vary due to age or medical condition, Ondansetron and Dexamethasone  Airway Management Planned: LMA  Additional Equipment: None  Intra-op Plan:   Post-operative Plan: Extubation in OR  Informed Consent: I have reviewed the patients History and Physical, chart, labs and discussed the procedure including the risks, benefits and alternatives for the proposed anesthesia with the patient or authorized representative who has indicated his/her understanding and acceptance.     Dental advisory  given  Plan Discussed with: CRNA and Anesthesiologist  Anesthesia Plan Comments:        Anesthesia Quick Evaluation

## 2019-08-02 NOTE — Discharge Instructions (Signed)

## 2019-08-02 NOTE — Anesthesia Postprocedure Evaluation (Signed)
Anesthesia Post Note  Patient: Jennifer [name]  Procedure(s) Performed: TOTAL  RIGHT  HIP ARTHROPLASTY ANTERIOR APPROACH (Right Hip)     Patient location during evaluation: PACU Anesthesia Type: General Level of consciousness: awake and alert Pain management: pain level controlled Vital Signs Assessment: post-procedure vital signs reviewed and stable Respiratory status: spontaneous breathing, nonlabored ventilation and respiratory function stable Cardiovascular status: blood pressure returned to baseline and stable Postop Assessment: no apparent nausea or vomiting Anesthetic complications: no    Last Vitals:  Vitals:   08/02/19 1530 08/02/19 1545  BP: 110/67 122/72  Pulse: 91 (!) 103  Resp: 14 13  Temp:    SpO2: 98% 100%    Last Pain:  Vitals:   08/02/19 1530  TempSrc:   PainSc: [place] Elfriede Bonini

## 2019-08-02 NOTE — Anesthesia Procedure Notes (Signed)
Procedure Name: LMA Insertion Date/Time: 08/02/2019 12:30 PM Performed by: Lavina Hamman, CRNA Pre-anesthesia Checklist: Patient identified, Emergency Drugs available, Suction available and Patient being monitored Patient Re-evaluated:Patient Re-evaluated prior to induction Oxygen Delivery Method: Circle System Utilized Preoxygenation: Pre-oxygenation with 100% oxygen Induction Type: IV induction Ventilation: Mask ventilation without difficulty LMA: LMA inserted LMA Size: 4.0 Number of attempts: 1 Airway Equipment and Method: Bite block Placement Confirmation: positive ETCO2 Tube secured with: Tape Dental Injury: Teeth and Oropharynx as per pre-operative assessment

## 2019-08-02 NOTE — Plan of Care (Signed)
POC ititiated

## 2019-08-02 NOTE — H&P (Signed)
TOTAL HIP ADMISSION H&P  Patient is admitted for right total hip arthroplasty.  Subjective:  Chief Complaint: right hip pain  HPI: Jennifer [name], 73 y.o. female, has a history of pain and functional disability in the right hip(s) due to arthritis and avascular necrosis and patient has failed non-surgical conservative treatments for greater than 12 weeks to include corticosteriod injections and activity modification.  Onset of symptoms was gradual starting 1 years ago with gradually worsening course since that time.The patient noted no past surgery on the right hip(s).  Patient currently rates pain in the right hip at 10 out of 10 with activity. Patient has night pain, worsening of pain with activity and weight bearing, pain that interfers with activities of daily living and pain with passive range of motion. Patient has evidence of  advanced bilateral hip osteoarthritis with complete loss of joint space some evidence of femoral head flattening bilaterally. Periarticular osteophytes noted. by imaging studies. This condition presents safety issues increasing the risk of falls. There is no current active infection.  Patient Active Problem List   Diagnosis Date Noted  . On home oxygen therapy 11/23/2018  . Environmental allergies 11/23/2018  . White coat syndrome with diagnosis of hypertension 11/23/2018  . COPD (chronic obstructive pulmonary disease) ([place]) 11/03/2018  . Hypertension goal BP (blood pressure) < 140/90 11/03/2018  . Familial hypercholanemia 11/03/2018  . HLD (hyperlipidemia) 11/03/2018  . Vitamin D deficiency 11/03/2018  . Hypothyroid 11/03/2018  . Von Willebrand disease ([place]) possible 11/03/2018  . Elevated uric acid in blood 11/03/2018  . Centrilobular emphysema ([place]) 11/03/2018  . Hypoxia 11/03/2018  . Elevated troponin 09/18/2016   Past Medical History:  Diagnosis Date  . COPD (chronic obstructive pulmonary disease) ([place]) 11/03/2018  . Familial hypercholanemia 11/03/2018   . HLD (hyperlipidemia) 11/03/2018  . HTN (hypertension) 11/03/2018  . Hypothyroid 11/03/2018  . Vitamin D deficiency 11/03/2018  . Von Willebrand disease ([place]) possible 11/03/2018   Seen in documentation from prior provider    Past Surgical History:  Procedure Laterality Date  . APPENDECTOMY  1978  . BIOPSY ENDOMETRIAL    . CESAREAN SECTION     triplets  . TUBAL LIGATION      No current facility-administered medications for this encounter.    Current Outpatient Medications  Medication Sig Dispense Refill Last Dose  . acetaminophen (TYLENOL) 650 MG CR tablet Take 650 mg by mouth every 8 (eight) hours as needed.     Marland Kitchen albuterol (VENTOLIN HFA) 108 (90 Base) MCG/ACT inhaler Inhale 1-2 puffs into the lungs every 4 (four) hours as needed for wheezing or shortness of breath. 16 g 0   . chlorpheniramine (CHLOR-TRIMETON) 4 MG tablet Take 4 mg by mouth daily.     . cyclobenzaprine (FLEXERIL) 5 MG tablet Take 1 tablet (5 mg total) by mouth 3 (three) times daily as needed for muscle spasms. 90 tablet 1   . lisinopril-hydrochlorothiazide (ZESTORETIC) 10-12.5 MG tablet Take 1 tablet by mouth daily. 90 tablet 3   . montelukast (SINGULAIR) 10 MG tablet Take 1 tablet (10 mg total) by mouth at bedtime. 90 tablet 2   . SYMBICORT 160-4.5 MCG/ACT inhaler TAKE 2 PUFFS BY MOUTH TWICE A DAY (Patient taking differently: Inhale 2 puffs into the lungs 2 (two) times daily. ) 30.6 Inhaler 2   . traMADol (ULTRAM) 50 MG tablet Take 50 mg by mouth every 12 (twelve) hours as needed (pain).     . colchicine 0.6 MG tablet Take 1 tablet (0.6 mg total)  by mouth daily. (Patient not taking: Reported on 07/22/2019) 30 tablet 1 Not Taking at Unknown time  . lisinopril-hydrochlorothiazide (PRINZIDE,ZESTORETIC) 20-25 MG tablet Take 1 tablet by mouth daily. (Patient not taking: Reported on 07/22/2019) 90 tablet 1 Not Taking at Unknown time   Allergies  Allergen Reactions  . Prednisone Other (See Comments)    psychological  reaction, hallucinations, Paranoia  . Statins Other (See Comments)    Other reaction(s): muscle/joint pain Weakness and tiredness    Social History   Tobacco Use  . Smoking status: Former Smoker    Packs/day: 0.50    Years: 15.00    Pack years: 7.50    Types: Cigarettes    Quit date: 07/22/2014    Years since quitting: 5.0  . Smokeless tobacco: Never Used  Substance Use Topics  . Alcohol use: Not Currently    Family History  Problem Relation Age of Onset  . Parkinson's disease Mother   . Cancer Father        prostate  . Asthma Brother      Review of Systems  Constitutional: Negative for chills and fever.  Respiratory: Negative for cough and shortness of breath.   Cardiovascular: Negative for chest pain and palpitations.  Gastrointestinal: Negative for nausea and vomiting.  Musculoskeletal: Positive for joint pain.    Objective:  Physical Exam Patient is a 73 year old female.  Well nourished and well developed. General: Alert and oriented x3, cooperative and pleasant, no acute distress. Head: normocephalic, atraumatic, neck supple. Eyes: EOMI. Respiratory: breath sounds clear in all fields, no wheezing, rales, or rhonchi. Cardiovascular: Regular rate and rhythm, no murmurs, gallops or rubs. Abdomen: non-tender to palpation and soft, normoactive bowel sounds. Musculoskeletal: Bilateral hip exam: Significant limited range of motion with pain with hip flexion internal rotation to 5 with pelvic tilting and external rotation to 20 both with pain. She is otherwise neurovascular intact  Calves soft and nontender. Motor function intact in LE. Strength 5/5 LE bilaterally. Neuro: Distal pulses 2+. Sensation to light touch intact in LE.  Vital signs in last 24 hours:    Labs:   Estimated body mass index is 27.35 kg/m as calculated from the following:   Height as of 07/29/19: 5' 6.5" (1.689 m).   Weight as of 07/29/19: 78 kg.   Imaging Review Plain radiographs  demonstrate severe degenerative joint disease of the right hip(s). The bone quality appears to be adequate for age and reported activity level.   Assessment/Plan:  End stage arthritis, right hip(s)  The patient history, physical examination, clinical judgement of the provider and imaging studies are consistent with end stage degenerative joint disease of the right hip(s) and total hip arthroplasty is deemed medically necessary. The treatment options including medical management, injection therapy, arthroscopy and arthroplasty were discussed at length. The risks and benefits of total hip arthroplasty were presented and reviewed. The risks due to aseptic loosening, infection, stiffness, dislocation/subluxation,  thromboembolic complications and other imponderables were discussed.  The patient acknowledged the explanation, agreed to proceed with the plan and consent was signed. Patient is being admitted for inpatient treatment for surgery, pain control, PT, OT, prophylactic antibiotics, VTE prophylaxis, progressive ambulation and ADL's and discharge planning.The patient is planning to be discharged home.   Therapy Plans: HEP Disposition: Home with daughter Planned DVT Prophylaxis: aspirin 81mg  BID DME needed: none PCP: Dr. Denyse Amassorey, clearance received TXA: IV Allergies: prednisone - paranoia, can only tolerate with xanax, adhesives - skin irritation, itching Anesthesia Concerns: none BMI: 28  Other: Norco okay.  - Patient was instructed on what medications to stop prior to surgery. - Follow-up visit in 2 weeks with Dr. Charlann Boxer - Begin physical therapy following surgery - Pre-operative lab work as pre-surgical testing - Prescriptions will be provided in hospital at time of discharge  Dennie Bible, PA-C Orthopedic Surgery [facility] [place] [phone]

## 2019-08-02 NOTE — Interval H&P Note (Signed)
History and Physical Interval Note:  08/02/2019 11:23 AM  Jennifer [name]  has presented today for surgery, with the diagnosis of Right hip avascular necrosis.  The various methods of treatment have been discussed with the patient and family. After consideration of risks, benefits and other options for treatment, the patient has consented to  Procedure(s) with comments: TOTAL  RIGHT  HIP ARTHROPLASTY ANTERIOR APPROACH (Right) - 70 mins as a surgical intervention.  The patient's history has been reviewed, patient examined, no change in status, stable for surgery.  I have reviewed the patient's chart and labs.  Questions were answered to the patient's satisfaction.     Mauri Pole

## 2019-08-03 ENCOUNTER — Encounter (HOSPITAL_COMMUNITY): Payer: Self-pay | Admitting: Orthopedic Surgery

## 2019-08-03 DIAGNOSIS — Z7951 Long term (current) use of inhaled steroids: Secondary | ICD-10-CM | POA: Diagnosis not present

## 2019-08-03 DIAGNOSIS — I10 Essential (primary) hypertension: Secondary | ICD-10-CM | POA: Diagnosis not present

## 2019-08-03 DIAGNOSIS — J432 Centrilobular emphysema: Secondary | ICD-10-CM | POA: Diagnosis not present

## 2019-08-03 DIAGNOSIS — M1611 Unilateral primary osteoarthritis, right hip: Secondary | ICD-10-CM | POA: Diagnosis not present

## 2019-08-03 DIAGNOSIS — Z87891 Personal history of nicotine dependence: Secondary | ICD-10-CM | POA: Diagnosis not present

## 2019-08-03 DIAGNOSIS — Z9981 Dependence on supplemental oxygen: Secondary | ICD-10-CM | POA: Diagnosis not present

## 2019-08-03 DIAGNOSIS — Z79899 Other long term (current) drug therapy: Secondary | ICD-10-CM | POA: Diagnosis not present

## 2019-08-03 LAB — CBC
HCT: 33.6 % — ABNORMAL LOW (ref 36.0–46.0)
Hemoglobin: 11 g/dL — ABNORMAL LOW (ref 12.0–15.0)
MCH: 32.8 pg (ref 26.0–34.0)
MCHC: 32.7 g/dL (ref 30.0–36.0)
MCV: 100.3 fL — ABNORMAL HIGH (ref 80.0–100.0)
Platelets: 157 10*3/uL (ref 150–400)
RBC: 3.35 MIL/uL — ABNORMAL LOW (ref 3.87–5.11)
RDW: 13.4 % (ref 11.5–15.5)
WBC: 10.1 10*3/uL (ref 4.0–10.5)
nRBC: 0 % (ref 0.0–0.2)

## 2019-08-03 LAB — BASIC METABOLIC PANEL WITH GFR
Anion gap: 8 (ref 5–15)
BUN: 20 mg/dL (ref 8–23)
CO2: 24 mmol/L (ref 22–32)
Calcium: 8.9 mg/dL (ref 8.9–10.3)
Chloride: 104 mmol/L (ref 98–111)
Creatinine, Ser: 0.75 mg/dL (ref 0.44–1.00)
GFR calc Af Amer: >60 mL/min
GFR calc non Af Amer: >60 mL/min
Glucose, Bld: 116 mg/dL — ABNORMAL HIGH (ref 70–99)
Potassium: 3.6 mmol/L (ref 3.5–5.1)
Sodium: 136 mmol/L (ref 135–145)

## 2019-08-03 MED ORDER — DOCUSATE SODIUM 100 MG PO CAPS: 100.0000 mg | ORAL_CAPSULE | Freq: Two times a day (BID) | ORAL | 0 refills | Status: DC

## 2019-08-03 MED ORDER — FERROUS SULFATE 325 (65 FE) MG PO TABS: 325.0000 mg | ORAL_TABLET | Freq: Three times a day (TID) | ORAL | 0 refills | Status: DC

## 2019-08-03 MED ORDER — SODIUM CHLORIDE 0.9 % IV BOLUS
500.0000 mL | Freq: Once | INTRAVENOUS | Status: AC
  Administered 2019-08-03: 500 mL via INTRAVENOUS

## 2019-08-03 MED ORDER — POLYETHYLENE GLYCOL 3350 17 G PO PACK: 17.0000 g | PACK | Freq: Two times a day (BID) | ORAL | 0 refills | Status: DC

## 2019-08-03 MED ORDER — HYDROCODONE-ACETAMINOPHEN 7.5-325 MG PO TABS: 1.0000 | ORAL_TABLET | ORAL | 0 refills | Status: DC | PRN

## 2019-08-03 MED ORDER — ASPIRIN 81 MG PO CHEW: 81.0000 mg | CHEWABLE_TABLET | Freq: Two times a day (BID) | ORAL | 0 refills | Status: AC

## 2019-08-03 NOTE — Progress Notes (Signed)
Physical Therapy Treatment Patient Details Name: Jennifer [name] MRN: [id number] DOB: 1946-01-24 Today's Date: 08/03/2019    History of Present Illness R DA-THA; h/o COPD, HTN, white coat syndrome    PT Comments    Pt assisted to/from bathroom and reports pain meds earlier causing her to feel groggy.  Pt with audible left hip joint crepitus with ambulation.  Pt reported dizziness/spinning upon return to bed.  HR 133 bpm and RN notified.  Follow Up Recommendations  Follow surgeon's recommendation for DC plan and follow-up therapies     Equipment Recommendations  None recommended by PT    Recommendations for Other Services       Precautions / Restrictions Precautions Precautions: Fall Precaution Comments: monitor HR Restrictions Other Position/Activity Restrictions: WBAT    Mobility  Bed Mobility Overal bed mobility: Needs Assistance Bed Mobility: Supine to Sit;Sit to Supine     Supine to sit: Min assist Sit to supine: Mod assist   General bed mobility comments: increased time and effort, cues for technique, assist required for Bil LE onto bed  Transfers Overall transfer level: Needs assistance Equipment used: Rolling walker (2 wheeled) Transfers: Sit to/from Stand Sit to Stand: Min guard;From elevated surface         General transfer comment: verbal cues for safe technique  Ambulation/Gait Ambulation/Gait assistance: Min guard Gait Distance (Feet): 12 Feet(x2) Assistive device: Rolling walker (2 wheeled) Gait Pattern/deviations: Step-to pattern;Decreased stance time - right;Antalgic     General Gait Details: verbal cues for sequence, RW positioning; pt ambulated to/from bathroom and pt reports dizziness/spinning again (vitals obtained in supine in docflowsheets, of note: HR 133 bpm) RN notified   Stairs             Wheelchair Mobility    Modified Rankin (Stroke Patients Only)       Balance                                             Cognition Arousal/Alertness: Awake/alert Behavior During Therapy: WFL for tasks assessed/performed Overall Cognitive Status: Within Functional Limits for tasks assessed                                        Exercises    General Comments        Pertinent Vitals/Pain Pain Assessment: 0-10 Pain Score: 7  Pain Location: R hip Pain Descriptors / Indicators: Sore;Aching Pain Intervention(s): Monitored during session;Repositioned    Home Living                      Prior Function            PT Goals (current goals can now be found in the care plan section) Progress towards PT goals: Progressing toward goals    Frequency    7X/week      PT Plan Current plan remains appropriate    Co-evaluation              AM-PAC PT "6 Clicks" Mobility   Outcome Measure  Help needed turning from your back to your side while in a flat bed without using bedrails?: A Little Help needed moving from lying on your back to sitting on the side of a flat bed without using bedrails?: A Little Help  needed moving to and from a bed to a chair (including a wheelchair)?: A Little Help needed standing up from a chair using your arms (e.g., wheelchair or bedside chair)?: A Little Help needed to walk in hospital room?: A Little Help needed climbing 3-5 steps with a railing? : A Lot 6 Click Score: 17    End of Session Equipment Utilized During Treatment: Gait belt Activity Tolerance: Patient tolerated treatment well Patient left: in bed;with call bell/phone within reach;with bed alarm set Nurse Communication: Mobility status PT Visit Diagnosis: Muscle weakness (generalized) (M62.81);Difficulty in walking, not elsewhere classified (R26.2)     Time: 3614-4315 PT Time Calculation (min) (ACUTE ONLY): 20 min  Charges:  $Gait Training: 8-22 mins                     Carmelia Bake, PT, DPT Acute Rehabilitation Services Office: [phone] Pager:  [phone]  Trena Platt 08/03/2019, 2:32 PM

## 2019-08-03 NOTE — Progress Notes (Signed)
Physical Therapy Treatment Patient Details Name: Jennifer [name] MRN: [id number] DOB: [birth year] Today's Date: 08/03/2019    History of Present Illness R DA-THA; h/o COPD, HTN, white coat syndrome    PT Comments    Pt assisted with ambulating however limited to inside room due to pt reporting dizziness.  Pt performed LE exercises in supine as she preferred to return to bed.  (Pt states she was up to chair last night around 3 am)  Will return to continue mobility efforts.    Follow Up Recommendations  Follow surgeon's recommendation for DC plan and follow-up therapies     Equipment Recommendations  None recommended by PT    Recommendations for Other Services       Precautions / Restrictions Precautions Precautions: Fall Restrictions Other Position/Activity Restrictions: WBAT    Mobility  Bed Mobility Overal bed mobility: Needs Assistance Bed Mobility: Supine to Sit;Sit to Supine     Supine to sit: Min guard Sit to supine: Min assist   General bed mobility comments: increased time and effort, cues for technique, assist required for R LE onto bed  Transfers Overall transfer level: Needs assistance Equipment used: Rolling walker (2 wheeled) Transfers: Sit to/from Stand Sit to Stand: Min guard;From elevated surface         General transfer comment: verbal cues for safe technique  Ambulation/Gait Ambulation/Gait assistance: Min guard Gait Distance (Feet): 10 Feet Assistive device: Rolling walker (2 wheeled) Gait Pattern/deviations: Step-to pattern;Decreased stance time - right;Antalgic     General Gait Details: verbal cues for sequence, RW positioning; pt reports mild dizziness and requested only ambulating around bed; upon sitting: 122/79 mmHg and 120 bpm.  pt did not eat breakfast and feels she needs food.   Stairs             Wheelchair Mobility    Modified Rankin (Stroke Patients Only)       Balance                                             Cognition Arousal/Alertness: Awake/alert Behavior During Therapy: WFL for tasks assessed/performed Overall Cognitive Status: Within Functional Limits for tasks assessed                                        Exercises Total Joint Exercises Ankle Circles/Pumps: AROM;Both;10 reps Quad Sets: AROM;Right;10 reps Short Arc Quad: AROM;Right;10 reps Heel Slides: AAROM;Right;10 reps;Supine Hip ABduction/ADduction: AAROM;Right;10 reps;Supine    General Comments        Pertinent Vitals/Pain Pain Assessment: 0-10 Pain Score: 7  Pain Location: R hip Pain Descriptors / Indicators: Sore;Aching Pain Intervention(s): Repositioned;Monitored during session;Ice applied;Limited activity within patient's tolerance    Home Living                      Prior Function            PT Goals (current goals can now be found in the care plan section) Progress towards PT goals: Progressing toward goals    Frequency    7X/week      PT Plan Current plan remains appropriate    Co-evaluation              AM-PAC PT "6 Clicks" Mobility   Outcome Measure  Help needed turning from your back to your side while in a flat bed without using bedrails?: A Little Help needed moving from lying on your back to sitting on the side of a flat bed without using bedrails?: A Little Help needed moving to and from a bed to a chair (including a wheelchair)?: A Little Help needed standing up from a chair using your arms (e.g., wheelchair or bedside chair)?: A Little Help needed to walk in hospital room?: A Little Help needed climbing 3-5 steps with a railing? : A Lot 6 Click Score: 17    End of Session Equipment Utilized During Treatment: Gait belt Activity Tolerance: Patient tolerated treatment well Patient left: in bed;with call bell/phone within reach Nurse Communication: Mobility status PT Visit Diagnosis: Muscle weakness (generalized)  (M62.81);Difficulty in walking, not elsewhere classified (R26.2)     Time: 0940-1003 PT Time Calculation (min) (ACUTE ONLY): 23 min  Charges:  $Gait Training: 8-22 mins $Therapeutic Exercise: 8-22 mins                    Carmelia Bake, PT, DPT Acute Rehabilitation Services Office: [phone] Pager: [phone]  Trena Platt 08/03/2019, 11:11 AM

## 2019-08-03 NOTE — Progress Notes (Signed)
     Subjective: 1 Day Post-Op Procedure(s) (LRB): TOTAL  RIGHT  HIP ARTHROPLASTY ANTERIOR APPROACH (Right)   Patient reports pain as mild, pain controlled. No reported events throughout the night, other than not sleeping well. Dr. Alvan Dame discussed the procedure, findings and expectations moving forward. Patient states that she had no complications from intubation. Dr. Alvan Dame discussed getting the patient in with hematology to rule out/evaluate previous possible von Willebrand's diagnosis. Patient ready be discharged home, if she does well therapy. Patient follow-up in the clinic in 2 weeks. Patient knows to call with any questions or concerns.     Objective:   VITALS:   Vitals:   08/03/19 0404 08/03/19 0821  BP: 135/68   Pulse: (!) 102   Resp:    Temp: 98.4 F (36.9 C)   SpO2: 100% 93%    Dorsiflexion/Plantar flexion intact Incision: dressing C/D/I No cellulitis present Compartment soft  LABS Recent Labs    08/03/19 0507  HGB 11.0*  HCT 33.6*  WBC 10.1  PLT 157    Recent Labs    08/03/19 0507  NA 136  K 3.6  BUN 20  CREATININE 0.75  GLUCOSE 116*     Assessment/Plan: 1 Day Post-Op Procedure(s) (LRB): TOTAL  RIGHT  HIP ARTHROPLASTY ANTERIOR APPROACH (Right) Foley cath DC'd Advance diet Up with therapy D/C IV fluids Discharge home Follow up in 2 weeks at [facility] Follow up with OLIN,Akeel Reffner D in 2 weeks.  Contact information:  [facility] [address], [address] [place] [address] [phone]    Overweight (BMI 25-29.9) Estimated body mass index is 27.34 kg/m as calculated from the following:   Height as of this encounter: 5' 6.5" (1.689 m).   Weight as of this encounter: 78 kg. Patient also counseled that weight may inhibit the healing process Patient counseled that losing weight will help with future health issues        West Pugh. Jessicaann Overbaugh   PAC  08/03/2019, 8:26 AM

## 2019-08-04 DIAGNOSIS — Z79899 Other long term (current) drug therapy: Secondary | ICD-10-CM | POA: Diagnosis not present

## 2019-08-04 DIAGNOSIS — Z7951 Long term (current) use of inhaled steroids: Secondary | ICD-10-CM | POA: Diagnosis not present

## 2019-08-04 DIAGNOSIS — I10 Essential (primary) hypertension: Secondary | ICD-10-CM | POA: Diagnosis not present

## 2019-08-04 DIAGNOSIS — Z87891 Personal history of nicotine dependence: Secondary | ICD-10-CM | POA: Diagnosis not present

## 2019-08-04 DIAGNOSIS — M1611 Unilateral primary osteoarthritis, right hip: Secondary | ICD-10-CM | POA: Diagnosis not present

## 2019-08-04 DIAGNOSIS — J432 Centrilobular emphysema: Secondary | ICD-10-CM | POA: Diagnosis not present

## 2019-08-04 DIAGNOSIS — Z9981 Dependence on supplemental oxygen: Secondary | ICD-10-CM | POA: Diagnosis not present

## 2019-08-04 LAB — BASIC METABOLIC PANEL
Anion gap: 10 (ref 5–15)
BUN: 12 mg/dL (ref 8–23)
CO2: 24 mmol/L (ref 22–32)
Calcium: 9 mg/dL (ref 8.9–10.3)
Chloride: 102 mmol/L (ref 98–111)
Creatinine, Ser: 0.74 mg/dL (ref 0.44–1.00)
GFR calc Af Amer: >60 mL/min (ref >60)
GFR calc non Af Amer: >60 mL/min (ref >60)
Glucose, Bld: 94 mg/dL (ref 70–99)
Potassium: 3.2 mmol/L — ABNORMAL LOW (ref 3.5–5.1)
Sodium: 136 mmol/L (ref 135–145)

## 2019-08-04 LAB — CBC
HCT: 30.5 % — ABNORMAL LOW (ref 36.0–46.0)
Hemoglobin: 9.7 g/dL — ABNORMAL LOW (ref 12.0–15.0)
MCH: 32.6 pg (ref 26.0–34.0)
MCHC: 31.8 g/dL (ref 30.0–36.0)
MCV: 102.3 fL — ABNORMAL HIGH (ref 80.0–100.0)
Platelets: 143 10*3/uL — ABNORMAL LOW (ref 150–400)
RBC: 2.98 MIL/uL — ABNORMAL LOW (ref 3.87–5.11)
RDW: 13.7 % (ref 11.5–15.5)
WBC: 9.1 10*3/uL (ref 4.0–10.5)
nRBC: 0 % (ref 0.0–0.2)

## 2019-08-04 MED ORDER — SODIUM CHLORIDE 0.9 % IV BOLUS
250.0000 mL | Freq: Once | INTRAVENOUS | Status: AC
  Administered 2019-08-04: 250 mL via INTRAVENOUS

## 2019-08-04 NOTE — Discharge Summary (Signed)
Physician Discharge Summary  Patient ID: Jennifer [name] MRN: [id number] DOB/AGE: 71-Nov-1947 71 y.o.  Admit date: 08/02/2019 Discharge date:  08/04/19  /Admission Diagnoses: Right hip avascular necrosis Past Medical History:  Diagnosis Date  . COPD (chronic obstructive pulmonary disease) (HCC) 11/03/2018  . Familial hypercholanemia 11/03/2018  . HLD (hyperlipidemia) 11/03/2018  . HTN (hypertension) 11/03/2018  . Hypothyroid 11/03/2018  . Vitamin D deficiency 11/03/2018  . Von Willebrand disease (HCC) possible 11/03/2018   Seen in documentation from prior provider    Discharge Diagnoses:  Active Problems:   S/P right THA, AA   Status post total hip replacement, right   Surgeries: Procedure(s): TOTAL  RIGHT  HIP ARTHROPLASTY ANTERIOR APPROACH on 08/02/2019    Consultants:   Discharged Condition: Improved  Hospital Course: Jennifer [name] is an 73 y.o. female who was admitted 08/02/2019 with a chief complaint of No chief complaint on file. , and found to have a diagnosis of Right hip avascular necrosis.  They were brought to the operating room on 08/02/2019 and underwent Procedure(s): TOTAL  RIGHT  HIP ARTHROPLASTY ANTERIOR APPROACH.    They were given perioperative antibiotics:  Anti-infectives (From admission, onward)   Start     Dose/Rate Route Frequency Ordered Stop   08/02/19 1830  ceFAZolin (ANCEF) IVPB 2g/100 mL premix     2 g 200 mL/hr over 30 Minutes Intravenous Every 6 hours 08/02/19 1629 08/03/19 0135   08/02/19 1130  ceFAZolin (ANCEF) IVPB 2g/100 mL premix     2 g 200 mL/hr over 30 Minutes Intravenous On call to O.R. 08/02/19 1129 08/02/19 1229    .  They were given sequential compression devices, early ambulation, and Other (comment) Aspirin for DVT prophylaxis.  Recent vital signs:  Patient Vitals for the past 24 hrs:  BP Temp Temp src Pulse Resp SpO2  08/04/19 0517 - - - 100 - -  08/04/19 0514 (!) 100/50 - - (!) 104 18 -  08/04/19 0505 93/62 98.4 F  (36.9 C) - (!) 114 19 93 %  08/04/19 0056 104/67 - - (!) 108 - 93 %  08/03/19 2119 104/66 99.4 F (37.4 C) - (!) 107 18 93 %  08/03/19 1653 - - - (!) 109 - 93 %  08/03/19 1610 - - - (!) 106 - 97 %  08/03/19 1550 112/67 98.2 F (36.8 C) Oral (!) 102 17 100 %  08/03/19 1405 108/68 - - (!) 121 - 93 %  08/03/19 1300 104/63 98.1 F (36.7 C) Oral (!) 103 15 100 %  08/03/19 1059 106/68 98.4 F (36.9 C) Oral (!) 119 17 92 %  .  Recent laboratory studies: Dg Pelvis Portable  Result Date: 08/02/2019 CLINICAL DATA:  Right hip replacement. EXAM: PORTABLE PELVIS 1-2 VIEWS COMPARISON:  Right hip x-rays from same day. FINDINGS: The right hip demonstrates a total arthroplasty without evidence of hardware failure or complication. There is expected intra-articular air. There is no fracture or dislocation. The alignment is anatomic. Post-surgical changes noted in the surrounding soft tissues. End-stage left hip osteoarthritis with femoral head flattening. IMPRESSION: 1. Right total hip arthroplasty without acute postoperative complication. 2. End-stage left hip osteoarthritis. Electronically Signed   By: Obie Dredge M.D.   On: 08/02/2019 14:50   Dg C-arm 1-60 Min-no Report  Result Date: 08/02/2019 Fluoroscopy was utilized by the requesting physician.  No radiographic interpretation.   Dg Hip Operative Unilat W Or W/o Pelvis Right  Result Date: 08/02/2019 CLINICAL DATA:  73 year old female with right  hip arthroplasty. EXAM: OPERATIVE right HIP (WITH PELVIS IF PERFORMED) 1 VIEW TECHNIQUE: Fluoroscopic spot image(s) were submitted for interpretation post-operatively. COMPARISON:  None. FINDINGS: Two intraoperative fluoroscopic spot images of the right hip in AP view provided. There is a right hip arthroplasty. IMPRESSION: Right hip arthroplasty. Electronically Signed   By: Anner Crete M.D.   On: 08/02/2019 14:19    Discharge Medications:   Allergies as of 08/04/2019      Reactions   Prednisone  Other (See Comments)   psychological reaction, hallucinations, Paranoia   Statins Other (See Comments)   Other reaction(s): muscle/joint pain Weakness and tiredness      Medication List    STOP taking these medications   acetaminophen 650 MG CR tablet Commonly known as: TYLENOL   colchicine 0.6 MG tablet   traMADol 50 MG tablet Commonly known as: ULTRAM     TAKE these medications   albuterol 108 (90 Base) MCG/ACT inhaler Commonly known as: VENTOLIN HFA Inhale 1-2 puffs into the lungs every 4 (four) hours as needed for wheezing or shortness of breath.   aspirin 81 MG chewable tablet Commonly known as: Aspirin Childrens Chew 1 tablet (81 mg total) by mouth 2 (two) times daily. Take for 4 weeks, then resume regular dose.   chlorpheniramine 4 MG tablet Commonly known as: CHLOR-TRIMETON Take 4 mg by mouth daily.   cyclobenzaprine 5 MG tablet Commonly known as: FLEXERIL Take 1 tablet (5 mg total) by mouth 3 (three) times daily as needed for muscle spasms.   docusate sodium 100 MG capsule Commonly known as: Colace Take 1 capsule (100 mg total) by mouth 2 (two) times daily.   ferrous sulfate 325 (65 FE) MG tablet Commonly known as: FerrouSul Take 1 tablet (325 mg total) by mouth 3 (three) times daily with meals for 14 days.   HYDROcodone-acetaminophen 7.5-325 MG tablet Commonly known as: Norco Take 1-2 tablets by mouth every 4 (four) hours as needed for moderate pain.   lisinopril-hydrochlorothiazide 10-12.5 MG tablet Commonly known as: ZESTORETIC Take 1 tablet by mouth daily. What changed: Another medication with the same name was removed. Continue taking this medication, and follow the directions you see here.   montelukast 10 MG tablet Commonly known as: SINGULAIR Take 1 tablet (10 mg total) by mouth at bedtime.   polyethylene glycol 17 g packet Commonly known as: MIRALAX / GLYCOLAX Take 17 g by mouth 2 (two) times daily.   Symbicort 160-4.5 MCG/ACT  inhaler Generic drug: budesonide-formoterol TAKE 2 PUFFS BY MOUTH TWICE A DAY What changed: See the new instructions.            Discharge Care Instructions  (From admission, onward)         Start     Ordered   08/03/19 0000  Change dressing    Comments: Maintain surgical dressing until follow up in the clinic. If the edges start to pull up, may reinforce with tape. If the dressing is no longer working, may remove and cover with gauze and tape, but must keep the area dry and clean.  Call with any questions or concerns.   08/03/19 7867          Diagnostic Studies: Dg Pelvis Portable  Result Date: 08/02/2019 CLINICAL DATA:  Right hip replacement. EXAM: PORTABLE PELVIS 1-2 VIEWS COMPARISON:  Right hip x-rays from same day. FINDINGS: The right hip demonstrates a total arthroplasty without evidence of hardware failure or complication. There is expected intra-articular air. There is no fracture or  dislocation. The alignment is anatomic. Post-surgical changes noted in the surrounding soft tissues. End-stage left hip osteoarthritis with femoral head flattening. IMPRESSION: 1. Right total hip arthroplasty without acute postoperative complication. 2. End-stage left hip osteoarthritis. Electronically Signed   By: Obie DredgeWilliam T Derry M.D.   On: 08/02/2019 14:50   Dg C-arm 1-60 Min-no Report  Result Date: 08/02/2019 Fluoroscopy was utilized by the requesting physician.  No radiographic interpretation.   Dg Hip Operative Unilat W Or W/o Pelvis Right  Result Date: 08/02/2019 CLINICAL DATA:  73 year old female with right hip arthroplasty. EXAM: OPERATIVE right HIP (WITH PELVIS IF PERFORMED) 1 VIEW TECHNIQUE: Fluoroscopic spot image(s) were submitted for interpretation post-operatively. COMPARISON:  None. FINDINGS: Two intraoperative fluoroscopic spot images of the right hip in AP view provided. There is a right hip arthroplasty. IMPRESSION: Right hip arthroplasty. Electronically Signed   By: Elgie CollardArash   Radparvar M.D.   On: 08/02/2019 14:19    They benefited maximally from their hospital stay and there were no complications.     Disposition: Discharge disposition: 01-Home or Self Care      Discharge Instructions    Call MD / Call 911   Complete by: As directed    If you experience chest pain or shortness of breath, CALL 911 and be transported to the hospital emergency room.  If you develope a fever above 101 F, pus (white drainage) or increased drainage or redness at the wound, or calf pain, call your surgeon's office.   Change dressing   Complete by: As directed    Maintain surgical dressing until follow up in the clinic. If the edges start to pull up, may reinforce with tape. If the dressing is no longer working, may remove and cover with gauze and tape, but must keep the area dry and clean.  Call with any questions or concerns.   Constipation Prevention   Complete by: As directed    Drink plenty of fluids.  Prune juice may be helpful.  You may use a stool softener, such as Colace (over the counter) 100 mg twice a day.  Use MiraLax (over the counter) for constipation as needed.   Diet - low sodium heart healthy   Complete by: As directed    Discharge instructions   Complete by: As directed    Maintain surgical dressing until follow up in the clinic. If the edges start to pull up, may reinforce with tape. If the dressing is no longer working, may remove and cover with gauze and tape, but must keep the area dry and clean.  [facility]. Call with any questions or concerns.   Increase activity slowly as tolerated   Complete by: As directed    Weight bearing as tolerated with assist device (walker, cane, etc) as directed, use it as long as suggested by your surgeon or therapist, typically at least 4-6 weeks.   TED hose   Complete by: As directed    Use stockings (TED hose) for 2 weeks on both leg(s).  You may remove them at night for sleeping.      Follow-up Information    Durene Romanslin, Matthew, MD. Schedule an appointment as soon as possible for a visit in 2 weeks.   Specialty: Orthopedic Surgery Contact information: [address] [place] [address] [phone]            Signed: Karma GreaserSamantha Bonham Ariela Mochizuki 08/04/2019, 9:12 AM

## 2019-08-04 NOTE — Progress Notes (Signed)
Physical Therapy Treatment Patient Details Name: Jennifer [name] MRN: [id number] DOB: 1946/02/13 Today's Date: 08/04/2019    History of Present Illness R DA-THA; h/o COPD, HTN, white coat syndrome    PT Comments    Pt cooperative and progressing with mobility (ambulated 60' without c/o dizziness) but fatigues very easily and with very limited activity tolerance.  Follow Up Recommendations  Follow surgeon's recommendation for DC plan and follow-up therapies     Equipment Recommendations  None recommended by PT    Recommendations for Other Services       Precautions / Restrictions Precautions Precautions: Fall Precaution Comments: watch BP Restrictions Weight Bearing Restrictions: No Other Position/Activity Restrictions: WBAT    Mobility  Bed Mobility Overal bed mobility: Needs Assistance Bed Mobility: Supine to Sit;Sit to Supine     Supine to sit: Supervision Sit to supine: Jennifer assist   General bed mobility comments: increased time and use of belt and bed rail to exit bed; increased assist into bed 2* fatigue  Transfers Overall transfer level: Needs assistance Equipment used: Rolling walker (2 wheeled) Transfers: Sit to/from Stand Sit to Stand: Jennifer guard         General transfer comment: cues for use of UEs to self assist  Ambulation/Gait Ambulation/Gait assistance: Jennifer guard Gait Distance (Feet): 34 Feet Assistive device: Rolling walker (2 wheeled) Gait Pattern/deviations: Step-to pattern;Decreased step length - right;Decreased step length - left;Shuffle;Trunk flexed Gait velocity: decr   General Gait Details: increased time with cues for posture, position from RW and initial sequence.  Distance ltd by fatigue but no c/o dizziness    Stairs             Wheelchair Mobility    Modified Rankin (Stroke Patients Only)       Balance Overall balance assessment: Needs assistance Sitting-balance support: Feet supported;No upper extremity  supported Sitting balance-Leahy Scale: Good     Standing balance support: Bilateral upper extremity supported Standing balance-Leahy Scale: Fair                              Cognition Arousal/Alertness: Awake/alert Behavior During Therapy: WFL for tasks assessed/performed Overall Cognitive Status: Within Functional Limits for tasks assessed                                        Exercises      General Comments        Pertinent Vitals/Pain Pain Assessment: 0-10 Pain Score: 5  Pain Location: R hip Pain Descriptors / Indicators: Sore;Aching Pain Intervention(s): Limited activity within patient's tolerance;Monitored during session;Premedicated before session    Home Living                      Prior Function            PT Goals (current goals can now be found in the care plan section) Acute Rehab PT Goals Patient Stated Goal: to walk, come back in 6 weeks for L THA PT Goal Formulation: With patient Time For Goal Achievement: 08/09/19 Potential to Achieve Goals: Good Progress towards PT goals: Progressing toward goals    Frequency    7X/week      PT Plan Current plan remains appropriate    Co-evaluation              AM-PAC PT "6 Clicks" Mobility  Outcome Measure  Help needed turning from your back to your side while in a flat bed without using bedrails?: A Little Help needed moving from lying on your back to sitting on the side of a flat bed without using bedrails?: A Little Help needed moving to and from a bed to a chair (including a wheelchair)?: A Little Help needed standing up from a chair using your arms (e.g., wheelchair or bedside chair)?: A Little Help needed to walk in hospital room?: A Little Help needed climbing 3-5 steps with a railing? : A Lot 6 Click Score: 17    End of Session Equipment Utilized During Treatment: Gait belt Activity Tolerance: Patient limited by fatigue Patient left: in bed;with  call bell/phone within reach;with nursing/sitter in room Nurse Communication: Mobility status PT Visit Diagnosis: Muscle weakness (generalized) (M62.81);Difficulty in walking, not elsewhere classified (R26.2) Pain - Right/Left: Right Pain - part of body: Hip     Time: 8280-0349 PT Time Calculation (Jennifer) (ACUTE ONLY): 21 Jennifer  Charges:  $Gait Training: 8-22 mins $Therapeutic Activity: 8-22 mins                     Mauro Kaufmann PT Acute Rehabilitation Services Pager [phone] Office [phone]    Lelon Ikard 08/04/2019, 4:45 PM

## 2019-08-04 NOTE — Progress Notes (Signed)
Physical Therapy Treatment Patient Details Name: Jennifer [name] MRN: [id number] DOB: 02/28/1946 Today's Date: 08/04/2019    History of Present Illness R DA-THA; h/o COPD, HTN, white coat syndrome    PT Comments    Pt motivated to progress but limited by dizziness with OOB activity.  Pt performed home therex program with written instruction provided.  Pt ambulation limited by c/o increased dizziness - BP 82/56 - RN aware.   Follow Up Recommendations  Follow surgeon's recommendation for DC plan and follow-up therapies     Equipment Recommendations  None recommended by PT    Recommendations for Other Services       Precautions / Restrictions Precautions Precautions: Fall Precaution Comments: monitor HR Restrictions Weight Bearing Restrictions: No Other Position/Activity Restrictions: WBAT    Mobility  Bed Mobility Overal bed mobility: Needs Assistance Bed Mobility: Supine to Sit     Supine to sit: Min guard     General bed mobility comments: increased time and use of belt and bed rail  Transfers Overall transfer level: Needs assistance Equipment used: Rolling walker (2 wheeled) Transfers: Sit to/from Stand Sit to Stand: Min guard;From elevated surface         General transfer comment: cues for use of UEs to self assist  Ambulation/Gait Ambulation/Gait assistance: Min guard Gait Distance (Feet): 5 Feet Assistive device: Rolling walker (2 wheeled) Gait Pattern/deviations: Step-to pattern;Decreased stance time - right;Antalgic;Shuffle Gait velocity: decr   General Gait Details: cues for sequence, posture and position from RW; distance ltd by increasing dizziness - BP 82/56   Stairs             Wheelchair Mobility    Modified Rankin (Stroke Patients Only)       Balance Overall balance assessment: Needs assistance Sitting-balance support: Feet supported;No upper extremity supported Sitting balance-Leahy Scale: Good     Standing balance  support: Bilateral upper extremity supported Standing balance-Leahy Scale: Fair                              Cognition Arousal/Alertness: Awake/alert Behavior During Therapy: WFL for tasks assessed/performed Overall Cognitive Status: Within Functional Limits for tasks assessed                                        Exercises Total Joint Exercises Ankle Circles/Pumps: AROM;Both;15 reps;Supine Quad Sets: AROM;Right;10 reps Heel Slides: AAROM;Right;Supine;20 reps Hip ABduction/ADduction: AAROM;Right;Supine;15 reps Long Arc Quad: AROM;Right;10 reps;Seated    General Comments        Pertinent Vitals/Pain Pain Assessment: 0-10 Pain Score: 5  Pain Location: R hip Pain Descriptors / Indicators: Sore;Aching Pain Intervention(s): Limited activity within patient's tolerance;Monitored during session;Premedicated before session;Ice applied    Home Living                      Prior Function            PT Goals (current goals can now be found in the care plan section) Acute Rehab PT Goals Patient Stated Goal: to walk, come back in 6 weeks for L THA PT Goal Formulation: With patient Time For Goal Achievement: 08/09/19 Potential to Achieve Goals: Good Progress towards PT goals: Not progressing toward goals - comment(orthostatic)    Frequency    7X/week      PT Plan Current plan remains appropriate  Co-evaluation              AM-PAC PT "6 Clicks" Mobility   Outcome Measure  Help needed turning from your back to your side while in a flat bed without using bedrails?: A Little Help needed moving from lying on your back to sitting on the side of a flat bed without using bedrails?: A Little Help needed moving to and from a bed to a chair (including a wheelchair)?: A Little Help needed standing up from a chair using your arms (e.g., wheelchair or bedside chair)?: A Little Help needed to walk in hospital room?: A Little Help needed  climbing 3-5 steps with a railing? : A Lot 6 Click Score: 17    End of Session Equipment Utilized During Treatment: Gait belt Activity Tolerance: Other (comment)(dizziness) Patient left: in chair;with call bell/phone within reach;with chair alarm set Nurse Communication: Mobility status PT Visit Diagnosis: Muscle weakness (generalized) (M62.81);Difficulty in walking, not elsewhere classified (R26.2) Pain - Right/Left: Right Pain - part of body: Hip     Time: 1101-1136 PT Time Calculation (min) (ACUTE ONLY): 35 min  Charges:  $Gait Training: 8-22 mins $Therapeutic Exercise: 8-22 mins                     Debe Coder PT Acute Rehabilitation Services Pager [phone] Office [phone]    Anik Wesch 08/04/2019, 3:37 PM

## 2019-08-04 NOTE — Progress Notes (Signed)
Physical Therapy Treatment Patient Details Name: Jennifer [name] MRN: [id number] DOB: [birth year] Today's Date: 08/04/2019    History of Present Illness R DA-THA; h/o COPD, HTN, white coat syndrome    PT Comments    Pt with mild c/o dizziness/nausea - BP supine 112/63 HR 101; sitting 107/66 HR 114; standing 108/82 HR 154; standing 2 minutes 107/66 HR 119.  Pt states too fatigued to attempt ambulation at this time.  RN aware.  Follow Up Recommendations  Follow surgeon's recommendation for DC plan and follow-up therapies     Equipment Recommendations  None recommended by PT    Recommendations for Other Services       Precautions / Restrictions Precautions Precautions: Fall Precaution Comments: monitor HR Restrictions Weight Bearing Restrictions: No Other Position/Activity Restrictions: WBAT    Mobility  Bed Mobility Overal bed mobility: Needs Assistance Bed Mobility: Supine to Sit;Sit to Supine     Supine to sit: Supervision Sit to supine: Min assist   General bed mobility comments: increased time and use of belt and bed rail to exit bed; increased assist into bed 2* fatigue  Transfers Overall transfer level: Needs assistance Equipment used: Rolling walker (2 wheeled) Transfers: Sit to/from Stand Sit to Stand: Min guard;From elevated surface         General transfer comment: cues for use of UEs to self assist  Ambulation/Gait Ambulation/Gait assistance: Min guard Gait Distance (Feet): 5 Feet Assistive device: Rolling walker (2 wheeled) Gait Pattern/deviations: Step-to pattern;Decreased stance time - right;Antalgic;Shuffle Gait velocity: decr   General Gait Details: Pt side-stepped up side of bed only - ltd by fatigue   Stairs             Wheelchair Mobility    Modified Rankin (Stroke Patients Only)       Balance Overall balance assessment: Needs assistance Sitting-balance support: Feet supported;No upper extremity supported Sitting  balance-Leahy Scale: Good     Standing balance support: Bilateral upper extremity supported Standing balance-Leahy Scale: Fair                              Cognition Arousal/Alertness: Awake/alert Behavior During Therapy: WFL for tasks assessed/performed Overall Cognitive Status: Within Functional Limits for tasks assessed                                        Exercises Total Joint Exercises Ankle Circles/Pumps: AROM;Both;15 reps;Supine Quad Sets: AROM;Right;10 reps Heel Slides: AAROM;Right;Supine;20 reps Hip ABduction/ADduction: AAROM;Right;Supine;15 reps Long Arc Quad: AROM;Right;10 reps;Seated    General Comments        Pertinent Vitals/Pain Pain Assessment: 0-10 Pain Score: 5  Pain Location: R hip Pain Descriptors / Indicators: Sore;Aching Pain Intervention(s): Limited activity within patient's tolerance;Monitored during session;Ice applied    Home Living                      Prior Function            PT Goals (current goals can now be found in the care plan section) Acute Rehab PT Goals Patient Stated Goal: to walk, come back in 6 weeks for L THA PT Goal Formulation: With patient Time For Goal Achievement: 08/09/19 Potential to Achieve Goals: Good Progress towards PT goals: Progressing toward goals    Frequency    7X/week      PT Plan Current  plan remains appropriate    Co-evaluation              AM-PAC PT "6 Clicks" Mobility   Outcome Measure  Help needed turning from your back to your side while in a flat bed without using bedrails?: A Little Help needed moving from lying on your back to sitting on the side of a flat bed without using bedrails?: A Little Help needed moving to and from a bed to a chair (including a wheelchair)?: A Little Help needed standing up from a chair using your arms (e.g., wheelchair or bedside chair)?: A Little Help needed to walk in hospital room?: A Little Help needed  climbing 3-5 steps with a railing? : A Lot 6 Click Score: 17    End of Session Equipment Utilized During Treatment: Gait belt Activity Tolerance: Patient limited by fatigue Patient left: in bed;with call bell/phone within reach Nurse Communication: Mobility status PT Visit Diagnosis: Muscle weakness (generalized) (M62.81);Difficulty in walking, not elsewhere classified (R26.2) Pain - Right/Left: Right Pain - part of body: Hip     Time: 9169-4503 PT Time Calculation (min) (ACUTE ONLY): 20 min  Charges:  $Gait Training: 8-22 mins $Therapeutic Exercise: 8-22 mins $Therapeutic Activity: 8-22 mins                     Mauro Kaufmann PT Acute Rehabilitation Services Pager [phone] Office [phone]    [facility] 08/04/2019, 3:43 PM

## 2019-08-05 DIAGNOSIS — I10 Essential (primary) hypertension: Secondary | ICD-10-CM | POA: Diagnosis not present

## 2019-08-05 DIAGNOSIS — J432 Centrilobular emphysema: Secondary | ICD-10-CM | POA: Diagnosis not present

## 2019-08-05 DIAGNOSIS — Z87891 Personal history of nicotine dependence: Secondary | ICD-10-CM | POA: Diagnosis not present

## 2019-08-05 DIAGNOSIS — Z79899 Other long term (current) drug therapy: Secondary | ICD-10-CM | POA: Diagnosis not present

## 2019-08-05 DIAGNOSIS — M1611 Unilateral primary osteoarthritis, right hip: Secondary | ICD-10-CM | POA: Diagnosis not present

## 2019-08-05 DIAGNOSIS — Z7951 Long term (current) use of inhaled steroids: Secondary | ICD-10-CM | POA: Diagnosis not present

## 2019-08-05 DIAGNOSIS — Z9981 Dependence on supplemental oxygen: Secondary | ICD-10-CM | POA: Diagnosis not present

## 2019-08-05 LAB — CBC
HCT: 25.8 % — ABNORMAL LOW (ref 36.0–46.0)
Hemoglobin: 8.4 g/dL — ABNORMAL LOW (ref 12.0–15.0)
MCH: 32.7 pg (ref 26.0–34.0)
MCHC: 32.6 g/dL (ref 30.0–36.0)
MCV: 100.4 fL — ABNORMAL HIGH (ref 80.0–100.0)
Platelets: 135 10*3/uL — ABNORMAL LOW (ref 150–400)
RBC: 2.57 MIL/uL — ABNORMAL LOW (ref 3.87–5.11)
RDW: 13.5 % (ref 11.5–15.5)
WBC: 6.1 10*3/uL (ref 4.0–10.5)
nRBC: 0 % (ref 0.0–0.2)

## 2019-08-05 NOTE — Progress Notes (Signed)
Patient ID: Jennifer [name], female   DOB: 08/23/1946, 73 y.o.   MRN: [id number] Subjective: 3 Days Post-Op Procedure(s) (LRB): TOTAL  RIGHT  HIP ARTHROPLASTY ANTERIOR APPROACH (Right)    Patient reports pain as mild to moderate but feels she did better yesterday with her activity  Objective:   VITALS:   Vitals:   08/04/19 2101 08/05/19 0501  BP: 108/77 110/68  Pulse: 100 92  Resp: 20 16  Temp: 98.9 F (37.2 C) 98 F (36.7 C)  SpO2: 94% 93%    Neurovascular intact Incision: dressing C/D/I  Tender right thigh but no blood on drainage and no significant swelling  LABS Recent Labs    08/03/19 0507 08/04/19 0751  HGB 11.0* 9.7*  HCT 33.6* 30.5*  WBC 10.1 9.1  PLT 157 143*    Recent Labs    08/03/19 0507 08/04/19 0751  NA 136 136  K 3.6 3.2*  BUN 20 12  CREATININE 0.75 0.74  GLUCOSE 116* 94    No results for input(s): LABPT, INR in the last 72 hours.   Assessment/Plan: 3 Days Post-Op Procedure(s) (LRB): TOTAL  RIGHT  HIP ARTHROPLASTY ANTERIOR APPROACH (Right)   Up with therapy  Recheck Hgb before discharge Home after therapy today RTC in 2 weeks Reviewed safety, exercises and goals

## 2019-08-05 NOTE — Progress Notes (Signed)
Physical Therapy Treatment Patient Details Name: Jennifer [name] MRN: [id number] DOB: 1946-07-14 Today's Date: 08/05/2019    History of Present Illness R DA-THA; h/o COPD, HTN, white coat syndrome    PT Comments    Pt performed home therex program - written instruction provided.  Pt requests break prior to attempting OOB activity 2* fatigue.   Follow Up Recommendations  Follow surgeon's recommendation for DC plan and follow-up therapies     Equipment Recommendations  None recommended by PT    Recommendations for Other Services       Precautions / Restrictions Precautions Precautions: Fall Precaution Comments: watch BP Restrictions Weight Bearing Restrictions: No Other Position/Activity Restrictions: WBAT    Mobility  Bed Mobility                  Transfers                    Ambulation/Gait                 Stairs             Wheelchair Mobility    Modified Rankin (Stroke Patients Only)       Balance                                            Cognition Arousal/Alertness: Awake/alert Behavior During Therapy: WFL for tasks assessed/performed Overall Cognitive Status: Within Functional Limits for tasks assessed                                        Exercises Total Joint Exercises Ankle Circles/Pumps: AROM;Both;15 reps;Supine Quad Sets: AROM;Right;10 reps Short Arc Quad: AROM;Right;10 reps Heel Slides: AAROM;Right;Supine;20 reps Hip ABduction/ADduction: AAROM;Right;Supine;15 reps    General Comments        Pertinent Vitals/Pain Pain Assessment: 0-10 Pain Score: 3  Pain Location: R hip Pain Descriptors / Indicators: Sore;Aching Pain Intervention(s): Limited activity within patient's tolerance;Monitored during session;Premedicated before session    Home Living                      Prior Function            PT Goals (current goals can now be found in the care plan  section) Acute Rehab PT Goals Patient Stated Goal: to walk, come back in 6 weeks for L THA PT Goal Formulation: With patient Time For Goal Achievement: 08/09/19 Potential to Achieve Goals: Good Progress towards PT goals: Progressing toward goals    Frequency    7X/week      PT Plan Current plan remains appropriate    Co-evaluation              AM-PAC PT "6 Clicks" Mobility   Outcome Measure  Help needed turning from your back to your side while in a flat bed without using bedrails?: A Little Help needed moving from lying on your back to sitting on the side of a flat bed without using bedrails?: A Little Help needed moving to and from a bed to a chair (including a wheelchair)?: A Little Help needed standing up from a chair using your arms (e.g., wheelchair or bedside chair)?: A Little Help needed to walk in hospital room?: A Little Help needed climbing 3-5 steps  with a railing? : A Lot 6 Click Score: 17    End of Session   Activity Tolerance: Patient limited by fatigue Patient left: in bed;with call bell/phone within reach Nurse Communication: Mobility status PT Visit Diagnosis: Muscle weakness (generalized) (M62.81);Difficulty in walking, not elsewhere classified (R26.2) Pain - Right/Left: Right Pain - part of body: Hip     Time: 5697-9480 PT Time Calculation (min) (ACUTE ONLY): 22 min  Charges:  $Therapeutic Exercise: 8-22 mins                     Debe Coder PT Acute Rehabilitation Services Pager [phone] Office [phone]    [facility] 08/05/2019, 12:36 PM

## 2019-08-05 NOTE — Evaluation (Signed)
Physical Therapy Evaluation Patient Details Name: Jennifer [name] MRN: [id number] DOB: 13-May-1946 Today's Date: 08/05/2019   History of Present Illness  R DA-THA; h/o COPD, HTN, white coat syndrome  Clinical Impression  Pt progressing slowly but steadily with mobility and able ambulate increased distance in hall with no c/o dizziness but continues to fatigue easily.    Follow Up Recommendations Follow surgeon's recommendation for DC plan and follow-up therapies    Equipment Recommendations  None recommended by PT    Recommendations for Other Services       Precautions / Restrictions Precautions Precautions: Fall Precaution Comments: watch BP Restrictions Weight Bearing Restrictions: No Other Position/Activity Restrictions: WBAT      Mobility  Bed Mobility Overal bed mobility: Needs Assistance Bed Mobility: Supine to Sit     Supine to sit: Modified independent (Device/Increase time)     General bed mobility comments: increased time and use of belt and bed rail to exit bed; pt states she plans to sleep in recliner initially  Transfers Overall transfer level: Needs assistance Equipment used: Rolling walker (2 wheeled) Transfers: Sit to/from Stand Sit to Stand: Supervision         General transfer comment: cues for use of UEs to self assist  Ambulation/Gait Ambulation/Gait assistance: Min guard;Supervision Gait Distance (Feet): 70 Feet Assistive device: Rolling walker (2 wheeled) Gait Pattern/deviations: Step-to pattern;Decreased step length - right;Decreased step length - left;Shuffle;Trunk flexed Gait velocity: decr   General Gait Details: increased time with cues for posture, position from RW and initial sequence.  Distance ltd by fatigue but no c/o dizziness   Stairs            Wheelchair Mobility    Modified Rankin (Stroke Patients Only)       Balance Overall balance assessment: Needs assistance Sitting-balance support: Feet supported;No  upper extremity supported Sitting balance-Leahy Scale: Good     Standing balance support: Bilateral upper extremity supported Standing balance-Leahy Scale: Fair                               Pertinent Vitals/Pain Pain Assessment: 0-10 Pain Score: 3  Pain Location: R hip Pain Descriptors / Indicators: Sore;Aching Pain Intervention(s): Limited activity within patient's tolerance;Monitored during session    Home Living                        Prior Function                 Hand Dominance        Extremity/Trunk Assessment                Communication      Cognition Arousal/Alertness: Awake/alert Behavior During Therapy: WFL for tasks assessed/performed Overall Cognitive Status: Within Functional Limits for tasks assessed                                        General Comments      Exercises Total Joint Exercises Ankle Circles/Pumps: AROM;Both;15 reps;Supine Quad Sets: AROM;Right;10 reps Short Arc Quad: AROM;Right;10 reps Heel Slides: AAROM;Right;Supine;20 reps Hip ABduction/ADduction: AAROM;Right;Supine;15 reps   Assessment/Plan    PT Assessment    PT Problem List         PT Treatment Interventions      PT Goals (Current goals can be found in the Care  Plan section)  Acute Rehab PT Goals Patient Stated Goal: to walk, come back in 6 weeks for L THA PT Goal Formulation: With patient Time For Goal Achievement: 08/09/19 Potential to Achieve Goals: Good    Frequency 7X/week   Barriers to discharge        Co-evaluation               AM-PAC PT "6 Clicks" Mobility  Outcome Measure Help needed turning from your back to your side while in a flat bed without using bedrails?: A Little Help needed moving from lying on your back to sitting on the side of a flat bed without using bedrails?: None Help needed moving to and from a bed to a chair (including a wheelchair)?: A Little Help needed standing up from  a chair using your arms (e.g., wheelchair or bedside chair)?: A Little Help needed to walk in hospital room?: A Little Help needed climbing 3-5 steps with a railing? : A Lot 6 Click Score: 18    End of Session Equipment Utilized During Treatment: Gait belt Activity Tolerance: Patient tolerated treatment well;Patient limited by fatigue Patient left: in chair;with call bell/phone within reach Nurse Communication: Mobility status PT Visit Diagnosis: Muscle weakness (generalized) (M62.81);Difficulty in walking, not elsewhere classified (R26.2) Pain - Right/Left: Right Pain - part of body: Hip    Time: 0102-7253 PT Time Calculation (min) (ACUTE ONLY): 21 min   Charges:     PT Treatments $Gait Training: 8-22 mins $Therapeutic Exercise: 8-22 mins        Jennifer [name] PT Acute Rehabilitation Services Pager [phone] Office [phone]   Jennifer [name] 08/05/2019, 12:43 PM

## 2019-08-17 NOTE — H&P (Signed)
TOTAL HIP ADMISSION H&P  Patient is admitted for left total hip arthroplasty, anterior approach.  Subjective:  Chief Complaint: Left hip primary OA / pain  HPI: Jennifer [name], 73 y.o. female, has a history of pain and functional disability in the left hip(s) due to arthritis and patient has failed non-surgical conservative treatments for greater than 12 weeks to include NSAID's and/or analgesics, corticosteriod injections, use of assistive devices and activity modification.  Onset of symptoms was gradual starting <1 year ago with gradually worsening course since that time.The patient noted prior procedures of the hip to include arthroplasty on the right hip per Dr. Alvan Dame on 114/24/2020.  Patient currently rates pain in the left hip at 8 out of 10 with activity. Patient has worsening of pain with activity and weight bearing, trendelenberg gait, pain that interfers with activities of daily living and pain with passive range of motion. Patient has evidence of periarticular osteophytes and joint space narrowing by imaging studies. This condition presents safety issues increasing the risk of falls.  There is no current active infection.  Risks, benefits and expectations were discussed with the patient.  Risks including but not limited to the risk of anesthesia, blood clots, nerve damage, blood vessel damage, failure of the prosthesis, infection and up to and including death.  Patient understand the risks, benefits and expectations and wishes to proceed with surgery.   PCP: Gregor Hams, MD  D/C Plans:       Home with daughter  Post-op Meds:       No Rx given   Tranexamic Acid:      To be given - IV   Decadron:      Is to be given  FYI:      ASA  Norco  Xanax low dose for anxiety  DME:   Pt already has equipment  PT:   [facility]  Pharmacy: [facility] [address]    Patient Active Problem List   Diagnosis Date Noted  . S/P right THA, AA 08/02/2019  . Status post total hip replacement, right  08/02/2019  . On home oxygen therapy 11/23/2018  . Environmental allergies 11/23/2018  . White coat syndrome with diagnosis of hypertension 11/23/2018  . COPD (chronic obstructive pulmonary disease) ([place]) 11/03/2018  . Hypertension goal BP (blood pressure) < 140/90 11/03/2018  . Familial hypercholanemia 11/03/2018  . HLD (hyperlipidemia) 11/03/2018  . Vitamin D deficiency 11/03/2018  . Hypothyroid 11/03/2018  . Von Willebrand disease ([place]) possible 11/03/2018  . Elevated uric acid in blood 11/03/2018  . Centrilobular emphysema ([place]) 11/03/2018  . Hypoxia 11/03/2018  . Elevated troponin 09/18/2016   Past Medical History:  Diagnosis Date  . COPD (chronic obstructive pulmonary disease) ([place]) 11/03/2018  . Familial hypercholanemia 11/03/2018  . HLD (hyperlipidemia) 11/03/2018  . HTN (hypertension) 11/03/2018  . Hypothyroid 11/03/2018  . Vitamin D deficiency 11/03/2018  . Von Willebrand disease ([place]) possible 11/03/2018   Seen in documentation from prior provider    Past Surgical History:  Procedure Laterality Date  . APPENDECTOMY  1978  . BIOPSY ENDOMETRIAL    . CESAREAN SECTION     triplets  . TOTAL HIP ARTHROPLASTY Right 08/02/2019   Procedure: TOTAL  RIGHT  HIP ARTHROPLASTY ANTERIOR APPROACH;  Surgeon: Paralee Cancel, MD;  Location: [facility];  Service: Orthopedics;  Laterality: Right;  70 mins  . TUBAL LIGATION      No current facility-administered medications for this encounter.    Current Outpatient Medications  Medication Sig Dispense Refill  Last Dose  . albuterol (VENTOLIN HFA) 108 (90 Base) MCG/ACT inhaler Inhale 1-2 puffs into the lungs every 4 (four) hours as needed for wheezing or shortness of breath. 16 g 0 Past Week at Unknown time  . aspirin (ASPIRIN CHILDRENS) 81 MG chewable tablet Chew 1 tablet (81 mg total) by mouth 2 (two) times daily. Take for 4 weeks, then resume regular dose. 60 tablet 0   . chlorpheniramine (CHLOR-TRIMETON) 4 MG tablet Take 4 mg by mouth daily.    08/01/2019 at 0730  . cyclobenzaprine (FLEXERIL) 5 MG tablet Take 1 tablet (5 mg total) by mouth 3 (three) times daily as needed for muscle spasms. 90 tablet 1 More than a month at Unknown time  . docusate sodium (COLACE) 100 MG capsule Take 1 capsule (100 mg total) by mouth 2 (two) times daily. 28 capsule 0   . ferrous sulfate (FERROUSUL) 325 (65 FE) MG tablet Take 1 tablet (325 mg total) by mouth 3 (three) times daily with meals for 14 days. 42 tablet 0   . HYDROcodone-acetaminophen (NORCO) 7.5-325 MG tablet Take 1-2 tablets by mouth every 4 (four) hours as needed for moderate pain. 60 tablet 0   . lisinopril-hydrochlorothiazide (ZESTORETIC) 10-12.5 MG tablet Take 1 tablet by mouth daily. 90 tablet 3 08/01/2019 at 0730  . montelukast (SINGULAIR) 10 MG tablet Take 1 tablet (10 mg total) by mouth at bedtime. 90 tablet 2 08/01/2019 at 2200  . polyethylene glycol (MIRALAX / GLYCOLAX) 17 g packet Take 17 g by mouth 2 (two) times daily. 28 packet 0   . SYMBICORT 160-4.5 MCG/ACT inhaler TAKE 2 PUFFS BY MOUTH TWICE A DAY (Patient taking differently: Inhale 2 puffs into the lungs 2 (two) times daily. ) 30.6 Inhaler 2 08/01/2019 at 2200   Allergies  Allergen Reactions  . Prednisone Other (See Comments)    psychological reaction, hallucinations, Paranoia  . Statins Other (See Comments)    Other reaction(s): muscle/joint pain Weakness and tiredness    Social History   Tobacco Use  . Smoking status: Former Smoker    Packs/day: 0.50    Years: 15.00    Pack years: 7.50    Types: Cigarettes    Quit date: 07/22/2014    Years since quitting: 5.0  . Smokeless tobacco: Never Used  Substance Use Topics  . Alcohol use: Not Currently    Family History  Problem Relation Age of Onset  . Parkinson's disease Mother   . Cancer Father        prostate  . Asthma Brother      Review of Systems  Constitutional: Negative.   HENT: Negative.   Eyes: Negative.   Respiratory: Negative.   Cardiovascular:  Negative.   Gastrointestinal: Negative.   Genitourinary: Negative.   Musculoskeletal: Positive for joint pain.  Skin: Negative.   Neurological: Negative.   Endo/Heme/Allergies: Positive for environmental allergies.  Psychiatric/Behavioral: The patient is nervous/anxious.     Objective:  Physical Exam  Constitutional: She is oriented to person, place, and time. She appears well-developed.  HENT:  Head: Normocephalic.  Eyes: Pupils are equal, round, and reactive to light.  Neck: Neck supple. No JVD present. No tracheal deviation present. No thyromegaly present.  Cardiovascular: Normal rate, regular rhythm and intact distal pulses.  Respiratory: Effort normal and breath sounds normal. No respiratory distress. She has no wheezes.  GI: Soft. There is no abdominal tenderness. There is no guarding.  Musculoskeletal:     Left hip: She exhibits decreased range of motion,  decreased strength, tenderness and bony tenderness. She exhibits no swelling, no deformity and no laceration.  Lymphadenopathy:    She has no cervical adenopathy.  Neurological: She is alert and oriented to person, place, and time.  Skin: Skin is warm and dry.  Psychiatric: She has a normal mood and affect.      Labs:  Estimated body mass index is 27.34 kg/m as calculated from the following:   Height as of 08/02/19: 5' 6.5" (1.689 m).   Weight as of 08/02/19: 78 kg.   Imaging Review Plain radiographs demonstrate severe degenerative joint disease of the left hip. The bone quality appears to be good for age and reported activity level.      Assessment/Plan:  End stage arthritis, left hip  The patient history, physical examination, clinical judgement of the provider and imaging studies are consistent with end stage degenerative joint disease of the left hip and total hip arthroplasty is deemed medically necessary. The treatment options including medical management, injection therapy, arthroscopy and  arthroplasty were discussed at length. The risks and benefits of total hip arthroplasty were presented and reviewed. The risks due to aseptic loosening, infection, stiffness, dislocation/subluxation,  thromboembolic complications and other imponderables were discussed.  The patient acknowledged the explanation, agreed to proceed with the plan and consent was signed. Patient is being admitted for inpatient treatment for surgery, pain control, PT, OT, prophylactic antibiotics, VTE prophylaxis, progressive ambulation and ADL's and discharge planning.The patient is planning to be discharged home.    Anastasio Auerbach Charlye Spare   PA-C  08/17/2019, 4:27 PM

## 2019-08-26 NOTE — Progress Notes (Addendum)
Can you please place order for the surgery schedule on 09/06/19. Pt. Has PST appointment on 08/29/2019.Thank you.

## 2019-08-26 NOTE — Patient Instructions (Addendum)
DUE TO COVID-19 ONLY ONE VISITOR IS ALLOWED TO COME WITH YOU AND STAY IN THE WAITING ROOM ONLY DURING PRE OP AND PROCEDURE DAY OF SURGERY. THE 1 VISITOR MAY VISIT WITH YOU AFTER SURGERY IN YOUR PRIVATE ROOM DURING VISITING HOURS ONLY!  YOU NEED TO HAVE A COVID 19 TEST ON: 09/03/2019 @__11 :00 am , THIS TEST MUST BE DONE BEFORE SURGERY, COME  [address], [place] , [phone].  [facility] ONCE YOUR COVID TEST IS COMPLETED, PLEASE BEGIN THE QUARANTINE INSTRUCTIONS AS OUTLINED IN YOUR HANDOUT.                Jennifer [name]   Your procedure is scheduled on: 09/06/2019   [facility]   Report to admitting at: 6:00 AM     Call this number if you have problems the morning of surgery [phone]    Remember:  BRUSH YOUR TEETH MORNING OF SURGERY AND RINSE YOUR MOUTH OUT, NO CHEWING GUM CANDY OR MINTS.     Take these medicines the morning of surgery with A SIP OF WATER: CHLORPHENIRAMINE,TYLENOL AS NEEDED,INHALERS  NO SOLID FOOD AFTER MIDNIGHT THE NIGHT PRIOR TO SURGERY. NOTHING BY MOUTH EXCEPT CLEAR LIQUIDS UNTIL: 5:30 AMPLEASE FINISH ENSURE DRINK PER SURGEON ORDER  WHICH NEEDS TO BE COMPLETED AT :5:30 AM   CLEAR LIQUID DIET   Foods Allowed                                                                     Foods Excluded  Coffee and tea, regular and decaf                             liquids that you cannot  Plain Jell-O any favor except red or purple                                           see through such as: Fruit ices (not with fruit pulp)                                     milk, soups, orange juice  Iced Popsicles                                    All solid food Carbonated beverages, regular and diet                                    Cranberry, grape and apple juices Sports drinks like Gatorade Lightly seasoned clear broth or consume(fat free) Sugar, honey syrup  Sample Menu Breakfast                                 Lunch  Supper Cranberry juice                    Beef broth                            Chicken broth Jell-O                                     Grape juice                           Apple juice Coffee or tea                        Jell-O                                      Popsicle                                                Coffee or tea                        Coffee or tea  _____________________________________________________________________                                 Jennifer [name] may not have any metal on your body including hair pins and              piercings  Do not wear jewelry, make-up, lotions, powders or perfumes, deodorant             Do not wear nail polish on your fingernails.  Do not shave  48 hours prior to surgery.              Men may shave face and neck.   Do not bring valuables to the hospital. [place].  Contacts, dentures or bridgework may not be worn into surgery.  Leave suitcase in the car. After surgery it may be brought to your room.     Patients discharged the day of surgery will not be allowed to drive home. IF YOU ARE HAVING SURGERY AND GOING HOME THE SAME DAY, YOU MUST HAVE AN ADULT TO DRIVE YOU HOME AND BE WITH YOU FOR 24 HOURS. YOU MAY GO HOME BY TAXI OR UBER OR ORTHERWISE, BUT AN ADULT MUST ACCOMPANY YOU HOME AND STAY WITH YOU FOR 24 HOURS.  Name and phone number of your driver:  Special Instructions: N/A              Please read over the following fact sheets you were given: _____________________________________________________________________             [facility] - Preparing for Surgery Before surgery, you can play an important role.  Because skin is not sterile, your skin needs to be as free of germs as possible.  You can reduce the number of germs on your skin by washing with CHG (  chlorahexidine gluconate) soap before surgery.  CHG is an antiseptic cleaner which  kills germs and bonds with the skin to continue killing germs even after washing. Please DO NOT use if you have an allergy to CHG or antibacterial soaps.  If your skin becomes reddened/irritated stop using the CHG and inform your nurse when you arrive at Short Stay. Do not shave (including legs and underarms) for at least 48 hours prior to the first CHG shower.  You may shave your face/neck. Please follow these instructions carefully:  1.  Shower with CHG Soap the night before surgery and the  morning of Surgery.  2.  If you choose to wash your hair, wash your hair first as usual with your  normal  shampoo.  3.  After you shampoo, rinse your hair and body thoroughly to remove the  shampoo.                           4.  Use CHG as you would any other liquid soap.  You can apply chg directly  to the skin and wash                       Gently with a scrungie or clean washcloth.  5.  Apply the CHG Soap to your body ONLY FROM THE NECK DOWN.   Do not use on face/ open                           Wound or open sores. Avoid contact with eyes, ears mouth and genitals (private parts).                       Wash face,  Genitals (private parts) with your normal soap.             6.  Wash thoroughly, paying special attention to the area where your surgery  will be performed.  7.  Thoroughly rinse your body with warm water from the neck down.  8.  DO NOT shower/wash with your normal soap after using and rinsing off  the CHG Soap.                9.  Pat yourself dry with a clean towel.            10.  Wear clean pajamas.            11.  Place clean sheets on your bed the night of your first shower and do not  sleep with pets. Day of Surgery : Do not apply any lotions/deodorants the morning of surgery.  Please wear clean clothes to the hospital/surgery center.  FAILURE TO FOLLOW THESE INSTRUCTIONS MAY RESULT IN THE CANCELLATION OF YOUR SURGERY PATIENT SIGNATURE_________________________________  NURSE  SIGNATURE__________________________________   Incentive Spirometer  An incentive spirometer is a tool that can help keep your lungs clear and active. This tool measures how well you are filling your lungs with each breath. Taking long deep breaths may help reverse or decrease the chance of developing breathing (pulmonary) problems (especially infection) following:  A long period of time when you are unable to move or be active. BEFORE THE PROCEDURE   If the spirometer includes an indicator to show your best effort, your nurse or respiratory therapist will set it to a desired goal.  If possible, sit up straight or lean slightly forward.  Try not to slouch.  Hold the incentive spirometer in an upright position. INSTRUCTIONS FOR USE  1. Sit on the edge of your bed if possible, or sit up as far as you can in bed or on a chair. 2. Hold the incentive spirometer in an upright position. 3. Breathe out normally. 4. Place the mouthpiece in your mouth and seal your lips tightly around it. 5. Breathe in slowly and as deeply as possible, raising the piston or the ball toward the top of the column. 6. Hold your breath for 3-5 seconds or for as long as possible. Allow the piston or ball to fall to the bottom of the column. 7. Remove the mouthpiece from your mouth and breathe out normally. 8. Rest for a few seconds and repeat Steps 1 through 7 at least 10 times every 1-2 hours when you are awake. Take your time and take a few normal breaths between deep breaths. 9. The spirometer may include an indicator to show your best effort. Use the indicator as a goal to work toward during each repetition. 10. After each set of 10 deep breaths, practice coughing to be sure your lungs are clear. If you have an incision (the cut made at the time of surgery), support your incision when coughing by placing a pillow or rolled up towels firmly against it. Once you are able to get out of bed, walk around indoors and cough  well. You may stop using the incentive spirometer when instructed by your caregiver.  RISKS AND COMPLICATIONS  Take your time so you do not get dizzy or light-headed.  If you are in pain, you may need to take or ask for pain medication before doing incentive spirometry. It is harder to take a deep breath if you are having pain. AFTER USE  Rest and breathe slowly and easily.  It can be helpful to keep track of a log of your progress. Your caregiver can provide you with a simple table to help with this. If you are using the spirometer at home, follow these instructions: [place] IF:   You are having difficultly using the spirometer.  You have trouble using the spirometer as often as instructed.  Your pain medication is not giving enough relief while using the spirometer.  You develop fever of 100.5 F (38.1 C) or higher. SEEK IMMEDIATE MEDICAL CARE IF:   You cough up bloody sputum that had not been present before.  You develop fever of 102 F (38.9 C) or greater.  You develop worsening pain at or near the incision site. MAKE SURE YOU:   Understand these instructions.  Will watch your condition.  Will get help right away if you are not doing well or get worse. Document Released: 01/05/2007 Document Revised: 11/17/2011 Document Reviewed: 03/08/2007 [facility] Patient Information 2014 [place], [place].   ________________________________________________________________________

## 2019-08-29 ENCOUNTER — Encounter (HOSPITAL_COMMUNITY)
Admission: RE | Admit: 2019-08-29 | Discharge: 2019-08-29 | Disposition: A | Discharge status: Home / Self Care | Payer: Medicare HMO | Source: Ambulatory Visit | Attending: Orthopedic Surgery | Admitting: Orthopedic Surgery

## 2019-08-29 ENCOUNTER — Encounter (HOSPITAL_COMMUNITY): Payer: Self-pay

## 2019-08-29 ENCOUNTER — Other Ambulatory Visit: Payer: Self-pay

## 2019-08-29 DIAGNOSIS — Z01812 Encounter for preprocedural laboratory examination: Secondary | ICD-10-CM | POA: Diagnosis not present

## 2019-08-29 HISTORY — DX: Pneumonia, unspecified organism: J18.9

## 2019-08-29 HISTORY — DX: Dyspnea, unspecified: R06.00

## 2019-08-29 HISTORY — DX: Anxiety disorder, unspecified: F41.9

## 2019-08-29 HISTORY — DX: Unspecified asthma, uncomplicated: J45.909

## 2019-08-29 HISTORY — DX: Unspecified osteoarthritis, unspecified site: M19.90

## 2019-08-29 NOTE — Progress Notes (Addendum)
PCP - EVAN COREY. . LOV: 06/07/2019 EPIC Cardiologist -   Chest x-ray -  EKG - 08/03/2019 (sinus tach HR: 108)EPIC Stress Test -  ECHO -  Cardiac Cath -   Sleep Study -  CPAP -   Fasting Blood Sugar -  Checks Blood Sugar _____ times a day  Blood Thinner Instructions: Aspirin Instructions:Stop it before surgery Last Dose:08/27/2019  Anesthesia review:   Patient denies shortness of breath, fever, cough and chest pain at PAT appointment   Patient verbalized understanding of instructions that were given to them at the PAT appointment. Patient was also instructed that they will need to review over the PAT instructions again at home before surgery.

## 2019-08-31 ENCOUNTER — Encounter (HOSPITAL_COMMUNITY)
Admission: RE | Admit: 2019-08-31 | Discharge: 2019-08-31 | Disposition: A | Discharge status: Home / Self Care | Payer: Medicare HMO | Source: Ambulatory Visit | Attending: Orthopedic Surgery | Admitting: Orthopedic Surgery

## 2019-08-31 ENCOUNTER — Other Ambulatory Visit: Payer: Self-pay

## 2019-08-31 DIAGNOSIS — Z01812 Encounter for preprocedural laboratory examination: Secondary | ICD-10-CM | POA: Diagnosis not present

## 2019-08-31 LAB — BASIC METABOLIC PANEL
Anion gap: 11 (ref 5–15)
BUN: 25 mg/dL — ABNORMAL HIGH (ref 8–23)
CO2: 25 mmol/L (ref 22–32)
Calcium: 10.4 mg/dL — ABNORMAL HIGH (ref 8.9–10.3)
Chloride: 102 mmol/L (ref 98–111)
Creatinine, Ser: 0.8 mg/dL (ref 0.44–1.00)
GFR calc Af Amer: >60 mL/min (ref >60)
GFR calc non Af Amer: >60 mL/min (ref >60)
Glucose, Bld: 107 mg/dL — ABNORMAL HIGH (ref 70–99)
Potassium: 3.6 mmol/L (ref 3.5–5.1)
Sodium: 138 mmol/L (ref 135–145)

## 2019-08-31 LAB — CBC
HCT: 36.9 % (ref 36.0–46.0)
Hemoglobin: 12 g/dL (ref 12.0–15.0)
MCH: 32.3 pg (ref 26.0–34.0)
MCHC: 32.5 g/dL (ref 30.0–36.0)
MCV: 99.5 fL (ref 80.0–100.0)
Platelets: 228 10*3/uL (ref 150–400)
RBC: 3.71 MIL/uL — ABNORMAL LOW (ref 3.87–5.11)
RDW: 14 % (ref 11.5–15.5)
WBC: 8.2 10*3/uL (ref 4.0–10.5)
nRBC: 0 % (ref 0.0–0.2)

## 2019-08-31 LAB — SURGICAL PCR SCREEN
MRSA, PCR: NEGATIVE
Staphylococcus aureus: POSITIVE — AB

## 2019-09-03 ENCOUNTER — Other Ambulatory Visit (HOSPITAL_COMMUNITY)
Admission: RE | Admit: 2019-09-03 | Discharge: 2019-09-03 | Disposition: A | Discharge status: Home / Self Care | Payer: Medicare HMO | Source: Ambulatory Visit | Attending: Orthopedic Surgery | Admitting: Orthopedic Surgery

## 2019-09-03 DIAGNOSIS — Z20828 Contact with and (suspected) exposure to other viral communicable diseases: Secondary | ICD-10-CM | POA: Insufficient documentation

## 2019-09-03 DIAGNOSIS — Z01812 Encounter for preprocedural laboratory examination: Secondary | ICD-10-CM | POA: Insufficient documentation

## 2019-09-03 LAB — SARS CORONAVIRUS 2 (TAT 6-24 HRS): SARS Coronavirus 2: NEGATIVE

## 2019-09-06 ENCOUNTER — Ambulatory Visit (HOSPITAL_COMMUNITY): Payer: Medicare HMO

## 2019-09-06 ENCOUNTER — Encounter (HOSPITAL_COMMUNITY): Payer: Self-pay | Admitting: Orthopedic Surgery

## 2019-09-06 ENCOUNTER — Other Ambulatory Visit: Payer: Self-pay

## 2019-09-06 ENCOUNTER — Encounter (HOSPITAL_COMMUNITY)
Admission: RE | Disposition: A | Discharge status: Home / Self Care | Payer: Self-pay | Source: Home / Self Care | Attending: Orthopedic Surgery

## 2019-09-06 ENCOUNTER — Ambulatory Visit (HOSPITAL_COMMUNITY): Payer: Medicare HMO | Admitting: Physician Assistant

## 2019-09-06 ENCOUNTER — Ambulatory Visit (HOSPITAL_COMMUNITY): Payer: Medicare HMO | Admitting: Anesthesiology

## 2019-09-06 ENCOUNTER — Observation Stay (HOSPITAL_COMMUNITY)
Admission: RE | Admit: 2019-09-06 | Discharge: 2019-09-07 | Disposition: A | Discharge status: Home / Self Care | Payer: Medicare HMO | Attending: Orthopedic Surgery | Admitting: Orthopedic Surgery

## 2019-09-06 DIAGNOSIS — Z87891 Personal history of nicotine dependence: Secondary | ICD-10-CM | POA: Diagnosis not present

## 2019-09-06 DIAGNOSIS — M6281 Muscle weakness (generalized): Secondary | ICD-10-CM | POA: Insufficient documentation

## 2019-09-06 DIAGNOSIS — E559 Vitamin D deficiency, unspecified: Secondary | ICD-10-CM | POA: Diagnosis not present

## 2019-09-06 DIAGNOSIS — M1612 Unilateral primary osteoarthritis, left hip: Principal | ICD-10-CM | POA: Insufficient documentation

## 2019-09-06 DIAGNOSIS — Z96649 Presence of unspecified artificial hip joint: Secondary | ICD-10-CM

## 2019-09-06 DIAGNOSIS — J432 Centrilobular emphysema: Secondary | ICD-10-CM | POA: Diagnosis not present

## 2019-09-06 DIAGNOSIS — E785 Hyperlipidemia, unspecified: Secondary | ICD-10-CM | POA: Diagnosis not present

## 2019-09-06 DIAGNOSIS — Z471 Aftercare following joint replacement surgery: Secondary | ICD-10-CM | POA: Diagnosis not present

## 2019-09-06 DIAGNOSIS — J45909 Unspecified asthma, uncomplicated: Secondary | ICD-10-CM | POA: Insufficient documentation

## 2019-09-06 DIAGNOSIS — F419 Anxiety disorder, unspecified: Secondary | ICD-10-CM | POA: Insufficient documentation

## 2019-09-06 DIAGNOSIS — D680 Von Willebrand's disease: Secondary | ICD-10-CM | POA: Insufficient documentation

## 2019-09-06 DIAGNOSIS — Z96642 Presence of left artificial hip joint: Secondary | ICD-10-CM

## 2019-09-06 DIAGNOSIS — E039 Hypothyroidism, unspecified: Secondary | ICD-10-CM | POA: Diagnosis not present

## 2019-09-06 DIAGNOSIS — Z7982 Long term (current) use of aspirin: Secondary | ICD-10-CM | POA: Insufficient documentation

## 2019-09-06 DIAGNOSIS — Z79899 Other long term (current) drug therapy: Secondary | ICD-10-CM | POA: Diagnosis not present

## 2019-09-06 DIAGNOSIS — Z7951 Long term (current) use of inhaled steroids: Secondary | ICD-10-CM | POA: Insufficient documentation

## 2019-09-06 DIAGNOSIS — Z96641 Presence of right artificial hip joint: Secondary | ICD-10-CM | POA: Diagnosis not present

## 2019-09-06 DIAGNOSIS — I10 Essential (primary) hypertension: Secondary | ICD-10-CM | POA: Insufficient documentation

## 2019-09-06 DIAGNOSIS — Z96651 Presence of right artificial knee joint: Secondary | ICD-10-CM

## 2019-09-06 DIAGNOSIS — Z419 Encounter for procedure for purposes other than remedying health state, unspecified: Secondary | ICD-10-CM

## 2019-09-06 HISTORY — PX: TOTAL HIP ARTHROPLASTY: SHX124

## 2019-09-06 LAB — TYPE AND SCREEN
ABO/RH(D): O POS
Antibody Screen: NEGATIVE

## 2019-09-06 SURGERY — ARTHROPLASTY, HIP, TOTAL, ANTERIOR APPROACH
Anesthesia: General | Site: Hip | Laterality: Left

## 2019-09-06 MED ORDER — LIDOCAINE 2% (20 MG/ML) 5 ML SYRINGE
INTRAMUSCULAR | Status: AC
  Filled 2019-09-06: qty 5

## 2019-09-06 MED ORDER — HYDROCODONE-ACETAMINOPHEN 7.5-325 MG PO TABS: 1.0000 | ORAL_TABLET | ORAL | Status: DC | PRN

## 2019-09-06 MED ORDER — CEFAZOLIN SODIUM-DEXTROSE 2-4 GM/100ML-% IV SOLN
2.0000 g | Freq: Four times a day (QID) | INTRAVENOUS | Status: AC
  Administered 2019-09-06 (×2): 2 g via INTRAVENOUS
  Filled 2019-09-06 (×2): qty 100

## 2019-09-06 MED ORDER — CEFAZOLIN SODIUM-DEXTROSE 2-4 GM/100ML-% IV SOLN
2.0000 g | INTRAVENOUS | Status: AC
  Administered 2019-09-06: 2 g via INTRAVENOUS
  Filled 2019-09-06: qty 100

## 2019-09-06 MED ORDER — LACTATED RINGERS IV BOLUS
500.0000 mL | Freq: Once | INTRAVENOUS | Status: AC
  Administered 2019-09-06: 500 mL via INTRAVENOUS

## 2019-09-06 MED ORDER — PROPOFOL 500 MG/50ML IV EMUL
INTRAVENOUS | Status: AC
  Filled 2019-09-06: qty 150

## 2019-09-06 MED ORDER — FERROUS SULFATE 325 (65 FE) MG PO TABS
325.0000 mg | ORAL_TABLET | Freq: Three times a day (TID) | ORAL | Status: DC
  Filled 2019-09-06 (×2): qty 1

## 2019-09-06 MED ORDER — HYDROCODONE-ACETAMINOPHEN 7.5-325 MG PO TABS: 1.0000 | ORAL_TABLET | ORAL | 0 refills | Status: DC | PRN

## 2019-09-06 MED ORDER — PHENYLEPHRINE HCL (PRESSORS) 10 MG/ML IV SOLN
INTRAVENOUS | Status: DC | PRN
  Administered 2019-09-06: 80 ug via INTRAVENOUS

## 2019-09-06 MED ORDER — PROPOFOL 10 MG/ML IV BOLUS
INTRAVENOUS | Status: DC | PRN
  Administered 2019-09-06: 40 mg via INTRAVENOUS
  Administered 2019-09-06: 160 mg via INTRAVENOUS

## 2019-09-06 MED ORDER — FENTANYL CITRATE (PF) 250 MCG/5ML IJ SOLN
INTRAMUSCULAR | Status: AC
  Filled 2019-09-06: qty 5

## 2019-09-06 MED ORDER — ALUM & MAG HYDROXIDE-SIMETH 200-200-20 MG/5ML PO SUSP: 15.0000 mL | ORAL | Status: DC | PRN

## 2019-09-06 MED ORDER — TRANEXAMIC ACID-NACL 1000-0.7 MG/100ML-% IV SOLN: 1000.0000 mg | Freq: Once | INTRAVENOUS | Status: DC

## 2019-09-06 MED ORDER — ONDANSETRON HCL 4 MG PO TABS: 4.0000 mg | ORAL_TABLET | Freq: Four times a day (QID) | ORAL | Status: DC | PRN

## 2019-09-06 MED ORDER — PHENOL 1.4 % MT LIQD: 1.0000 | OROMUCOSAL | Status: DC | PRN

## 2019-09-06 MED ORDER — PROPOFOL 500 MG/50ML IV EMUL
INTRAVENOUS | Status: DC | PRN
  Administered 2019-09-06: 150 ug/kg/min via INTRAVENOUS

## 2019-09-06 MED ORDER — METHOCARBAMOL 500 MG IVPB - SIMPLE MED
INTRAVENOUS | Status: AC
  Filled 2019-09-06: qty 50

## 2019-09-06 MED ORDER — MIDAZOLAM HCL 5 MG/5ML IJ SOLN
INTRAMUSCULAR | Status: DC | PRN
  Administered 2019-09-06: 2 mg via INTRAVENOUS

## 2019-09-06 MED ORDER — HYDROMORPHONE HCL 1 MG/ML IJ SOLN
INTRAMUSCULAR | Status: AC
  Filled 2019-09-06: qty 1

## 2019-09-06 MED ORDER — METHOCARBAMOL 500 MG PO TABS
500.0000 mg | ORAL_TABLET | Freq: Four times a day (QID) | ORAL | Status: DC | PRN
  Administered 2019-09-07: 500 mg via ORAL
  Filled 2019-09-06: qty 1

## 2019-09-06 MED ORDER — ONDANSETRON HCL 4 MG/2ML IJ SOLN
INTRAMUSCULAR | Status: AC
  Filled 2019-09-06: qty 2

## 2019-09-06 MED ORDER — ONDANSETRON HCL 4 MG/2ML IJ SOLN
INTRAMUSCULAR | Status: DC | PRN
  Administered 2019-09-06: 4 mg via INTRAVENOUS

## 2019-09-06 MED ORDER — PROMETHAZINE HCL 25 MG/ML IJ SOLN: 6.2500 mg | INTRAMUSCULAR | Status: DC | PRN

## 2019-09-06 MED ORDER — ACETAMINOPHEN 325 MG PO TABS: 325.0000 mg | ORAL_TABLET | Freq: Once | ORAL | Status: DC | PRN

## 2019-09-06 MED ORDER — ACETAMINOPHEN 325 MG PO TABS
325.0000 mg | ORAL_TABLET | Freq: Four times a day (QID) | ORAL | Status: DC | PRN
  Administered 2019-09-07: 650 mg via ORAL
  Filled 2019-09-06: qty 2

## 2019-09-06 MED ORDER — BISACODYL 10 MG RE SUPP: 10.0000 mg | Freq: Every day | RECTAL | Status: DC | PRN

## 2019-09-06 MED ORDER — ONDANSETRON HCL 4 MG/2ML IJ SOLN: 4.0000 mg | Freq: Four times a day (QID) | INTRAMUSCULAR | Status: DC | PRN

## 2019-09-06 MED ORDER — DIPHENHYDRAMINE HCL 12.5 MG/5ML PO ELIX: 12.5000 mg | ORAL_SOLUTION | ORAL | Status: DC | PRN

## 2019-09-06 MED ORDER — FENTANYL CITRATE (PF) 100 MCG/2ML IJ SOLN
INTRAMUSCULAR | Status: AC
  Filled 2019-09-06: qty 2

## 2019-09-06 MED ORDER — CELECOXIB 200 MG PO CAPS
200.0000 mg | ORAL_CAPSULE | Freq: Two times a day (BID) | ORAL | Status: DC
  Administered 2019-09-06 – 2019-09-07 (×2): 200 mg via ORAL
  Filled 2019-09-06 (×2): qty 1

## 2019-09-06 MED ORDER — METOCLOPRAMIDE HCL 5 MG PO TABS: 5.0000 mg | ORAL_TABLET | Freq: Three times a day (TID) | ORAL | Status: DC | PRN

## 2019-09-06 MED ORDER — SODIUM CHLORIDE 0.9 % IV SOLN: INTRAVENOUS | Status: DC

## 2019-09-06 MED ORDER — ACETAMINOPHEN 10 MG/ML IV SOLN: 1000.0000 mg | Freq: Once | INTRAVENOUS | Status: DC | PRN

## 2019-09-06 MED ORDER — LACTATED RINGERS IV SOLN: INTRAVENOUS | Status: DC

## 2019-09-06 MED ORDER — TRANEXAMIC ACID-NACL 1000-0.7 MG/100ML-% IV SOLN
1000.0000 mg | INTRAVENOUS | Status: AC
  Administered 2019-09-06: 1000 mg via INTRAVENOUS
  Filled 2019-09-06: qty 100

## 2019-09-06 MED ORDER — FENTANYL CITRATE (PF) 250 MCG/5ML IJ SOLN
INTRAMUSCULAR | Status: DC | PRN
  Administered 2019-09-06 (×7): 50 ug via INTRAVENOUS

## 2019-09-06 MED ORDER — ASPIRIN 81 MG PO CHEW: 81.0000 mg | CHEWABLE_TABLET | Freq: Two times a day (BID) | ORAL | 0 refills | Status: AC

## 2019-09-06 MED ORDER — MENTHOL 3 MG MT LOZG: 1.0000 | LOZENGE | OROMUCOSAL | Status: DC | PRN

## 2019-09-06 MED ORDER — LACTATED RINGERS IV BOLUS: 250.0000 mL | Freq: Once | INTRAVENOUS | Status: DC

## 2019-09-06 MED ORDER — POLYETHYLENE GLYCOL 3350 17 G PO PACK: 17.0000 g | PACK | Freq: Two times a day (BID) | ORAL | 0 refills | Status: DC

## 2019-09-06 MED ORDER — HYDROMORPHONE HCL 1 MG/ML IJ SOLN
0.5000 mg | INTRAMUSCULAR | Status: DC | PRN
  Administered 2019-09-06: 1 mg via INTRAVENOUS
  Filled 2019-09-06: qty 1

## 2019-09-06 MED ORDER — ACETAMINOPHEN 160 MG/5ML PO SOLN: 325.0000 mg | Freq: Once | ORAL | Status: DC | PRN

## 2019-09-06 MED ORDER — FERROUS SULFATE 325 (65 FE) MG PO TABS: 325.0000 mg | ORAL_TABLET | Freq: Three times a day (TID) | ORAL | 0 refills | Status: DC

## 2019-09-06 MED ORDER — LIDOCAINE HCL (CARDIAC) PF 100 MG/5ML IV SOSY
PREFILLED_SYRINGE | INTRAVENOUS | Status: DC | PRN
  Administered 2019-09-06: 40 mg via INTRAVENOUS

## 2019-09-06 MED ORDER — DOCUSATE SODIUM 100 MG PO CAPS: 100.0000 mg | ORAL_CAPSULE | Freq: Two times a day (BID) | ORAL | 0 refills | Status: DC

## 2019-09-06 MED ORDER — MEPERIDINE HCL 50 MG/ML IJ SOLN: 6.2500 mg | INTRAMUSCULAR | Status: DC | PRN

## 2019-09-06 MED ORDER — DOCUSATE SODIUM 100 MG PO CAPS
100.0000 mg | ORAL_CAPSULE | Freq: Two times a day (BID) | ORAL | Status: DC
  Administered 2019-09-06: 100 mg via ORAL
  Filled 2019-09-06 (×2): qty 1

## 2019-09-06 MED ORDER — METOCLOPRAMIDE HCL 5 MG/ML IJ SOLN: 5.0000 mg | Freq: Three times a day (TID) | INTRAMUSCULAR | Status: DC | PRN

## 2019-09-06 MED ORDER — MIDAZOLAM HCL 2 MG/2ML IJ SOLN
INTRAMUSCULAR | Status: AC
  Filled 2019-09-06: qty 2

## 2019-09-06 MED ORDER — STERILE WATER FOR IRRIGATION IR SOLN
Status: DC | PRN
  Administered 2019-09-06: 2000 mL

## 2019-09-06 MED ORDER — CHLORHEXIDINE GLUCONATE 4 % EX LIQD: 60.0000 mL | Freq: Once | CUTANEOUS | Status: DC

## 2019-09-06 MED ORDER — HYDROMORPHONE HCL 1 MG/ML IJ SOLN
0.2500 mg | INTRAMUSCULAR | Status: DC | PRN
  Administered 2019-09-06 (×4): 0.5 mg via INTRAVENOUS

## 2019-09-06 MED ORDER — SODIUM CHLORIDE 0.9 % IR SOLN
Status: DC | PRN
  Administered 2019-09-06: 1000 mL

## 2019-09-06 MED ORDER — POLYETHYLENE GLYCOL 3350 17 G PO PACK
17.0000 g | PACK | Freq: Two times a day (BID) | ORAL | Status: DC
  Administered 2019-09-06: 17 g via ORAL
  Filled 2019-09-06 (×2): qty 1

## 2019-09-06 MED ORDER — METHOCARBAMOL 500 MG IVPB - SIMPLE MED
500.0000 mg | Freq: Four times a day (QID) | INTRAVENOUS | Status: DC | PRN
  Administered 2019-09-06: 500 mg via INTRAVENOUS
  Filled 2019-09-06: qty 50

## 2019-09-06 MED ORDER — ASPIRIN 81 MG PO CHEW
81.0000 mg | CHEWABLE_TABLET | Freq: Two times a day (BID) | ORAL | Status: DC
  Administered 2019-09-06 – 2019-09-07 (×2): 81 mg via ORAL
  Filled 2019-09-06 (×2): qty 1

## 2019-09-06 MED ORDER — HYDROCODONE-ACETAMINOPHEN 5-325 MG PO TABS
1.0000 | ORAL_TABLET | ORAL | Status: DC | PRN
  Administered 2019-09-07: 1 via ORAL
  Filled 2019-09-06: qty 1

## 2019-09-06 MED ORDER — MAGNESIUM CITRATE PO SOLN: 1.0000 | Freq: Once | ORAL | Status: DC | PRN

## 2019-09-06 SURGICAL SUPPLY — 47 items
BAG DECANTER FOR FLEXI CONT (MISCELLANEOUS) IMPLANT
BAG ZIPLOCK 12X15 (MISCELLANEOUS) IMPLANT
BLADE SAG 18X100X1.27 (BLADE) ×3 IMPLANT
BLADE SURG SZ10 CARB STEEL (BLADE) ×6 IMPLANT
COVER PERINEAL POST (MISCELLANEOUS) ×3 IMPLANT
COVER SURGICAL LIGHT HANDLE (MISCELLANEOUS) ×3 IMPLANT
COVER WAND RF STERILE (DRAPES) IMPLANT
CUP ACETBLR 52 OD PINNACLE (Hips) ×3 IMPLANT
DERMABOND ADVANCED (GAUZE/BANDAGES/DRESSINGS) ×2
DERMABOND ADVANCED .7 DNX12 (GAUZE/BANDAGES/DRESSINGS) ×1 IMPLANT
DRAPE STERI IOBAN 125X83 (DRAPES) ×3 IMPLANT
DRAPE U-SHAPE 47X51 STRL (DRAPES) ×6 IMPLANT
DRESSING AQUACEL AG SP 3.5X10 (GAUZE/BANDAGES/DRESSINGS) ×1 IMPLANT
DRSG AQUACEL AG SP 3.5X10 (GAUZE/BANDAGES/DRESSINGS) ×3
DURAPREP 26ML APPLICATOR (WOUND CARE) ×3 IMPLANT
ELECT BLADE TIP CTD 4 INCH (ELECTRODE) ×3 IMPLANT
ELECT REM PT RETURN 15FT ADLT (MISCELLANEOUS) ×3 IMPLANT
ELIMINATOR HOLE APEX DEPUY (Hips) ×3 IMPLANT
GLOVE BIO SURGEON STRL SZ 6 (GLOVE) IMPLANT
GLOVE BIOGEL PI IND STRL 6.5 (GLOVE) IMPLANT
GLOVE BIOGEL PI IND STRL 7.5 (GLOVE) ×1 IMPLANT
GLOVE BIOGEL PI IND STRL 8.5 (GLOVE) ×1 IMPLANT
GLOVE BIOGEL PI INDICATOR 6.5 (GLOVE)
GLOVE BIOGEL PI INDICATOR 7.5 (GLOVE) ×2
GLOVE BIOGEL PI INDICATOR 8.5 (GLOVE) ×2
GLOVE ECLIPSE 8.0 STRL XLNG CF (GLOVE) ×6 IMPLANT
GLOVE ORTHO TXT STRL SZ7.5 (GLOVE) ×6 IMPLANT
GOWN STRL REUS W/TWL LRG LVL3 (GOWN DISPOSABLE) ×6 IMPLANT
GOWN STRL REUS W/TWL XL LVL3 (GOWN DISPOSABLE) ×3 IMPLANT
HEAD CERAMIC DELTA 36 PLUS 1.5 (Hips) ×3 IMPLANT
HOLDER FOLEY CATH W/STRAP (MISCELLANEOUS) ×3 IMPLANT
KIT TURNOVER KIT A (KITS) IMPLANT
LINER NEUTRAL 52X36MM PLUS 4 (Liner) ×3 IMPLANT
PACK ANTERIOR HIP CUSTOM (KITS) ×3 IMPLANT
PENCIL SMOKE EVACUATOR (MISCELLANEOUS) ×3 IMPLANT
SCREW 6.5MMX30MM (Screw) ×3 IMPLANT
STEM FEM ACTIS HIGH SZ8 (Stem) ×3 IMPLANT
SUT MNCRL AB 4-0 PS2 18 (SUTURE) ×3 IMPLANT
SUT STRATAFIX 0 PDS 27 VIOLET (SUTURE) ×3
SUT VIC AB 1 CT1 36 (SUTURE) ×9 IMPLANT
SUT VIC AB 2-0 CT1 27 (SUTURE) ×4
SUT VIC AB 2-0 CT1 TAPERPNT 27 (SUTURE) ×2 IMPLANT
SUTURE STRATFX 0 PDS 27 VIOLET (SUTURE) ×1 IMPLANT
TRAY FOLEY MTR SLVR 14FR STAT (SET/KITS/TRAYS/PACK) ×3 IMPLANT
TRAY FOLEY MTR SLVR 16FR STAT (SET/KITS/TRAYS/PACK) IMPLANT
WATER STERILE IRR 1000ML POUR (IV SOLUTION) ×3 IMPLANT
YANKAUER SUCT BULB TIP 10FT TU (MISCELLANEOUS) IMPLANT

## 2019-09-06 NOTE — Evaluation (Signed)
Physical Therapy Evaluation Patient Details Name: Jennifer [name] MRN: [id number] DOB: 03/05/1946 Today's Date: 09/06/2019   History of Present Illness  Patient is 73 y.o. female s/p Lt THA anterior approach on 09/06/19 with PMH significant for COPD, HTN, Rt THA in November, and white coat syndrome.    Clinical Impression  Makinsley Schiavi is a 73 y.o. female POD 0 s/p Lt THA anterior approach. Patient reports modified independence with RW for mobility at baseline. Patient is now limited by functional impairments (see PT problem list below) and requires min assist for transfers and gait with RW. Patient was able to ambulate ~5 feet with RW and min assist to perform stand step transfer to recliner. She was limited by hip pain and declined to ambulate greater distance. Patient will benefit from continued skilled PT interventions to address impairments and progress towards PLOF. Acute PT will follow to progress mobility and stair training in preparation for safe discharge home.    Follow Up Recommendations Follow surgeon's recommendation for DC plan and follow-up therapies    Equipment Recommendations  None recommended by PT    Recommendations for Other Services       Precautions / Restrictions Precautions Precautions: Fall Precaution Comments: watch BP Restrictions Weight Bearing Restrictions: No Other Position/Activity Restrictions: WBAT      Mobility  Bed Mobility Overal bed mobility: Needs Assistance Bed Mobility: Supine to Sit     Supine to sit: Min assist     General bed mobility comments: verbal ceus for sequencing LE movement and use of bed rail, assist required for Lt LE mobility and to raise trunk upright  Transfers Overall transfer level: Needs assistance Equipment used: Rolling walker (2 wheeled) Transfers: Sit to/from [employer] Sit to Stand: Min guard Stand pivot transfers: Min assist       General transfer comment: min guard for safety,  cues for safe hand placement and technique. Min assist stand step for RW management.  Ambulation/Gait Ambulation/Gait assistance: Min assist Gait Distance (Feet): 5 Feet Assistive device: Rolling walker (2 wheeled) Gait Pattern/deviations: Step-to pattern;Decreased step length - right;Decreased step length - left;Decreased stride length;Decreased weight shift to left Gait velocity: decr   General Gait Details: pt with increased time for step to pattern to perform stand step transfer with RW, assist requiref for walker management to complete turn after forward gait. pt declined to ambulate further distance due to pain.  Stairs            Wheelchair Mobility    Modified Rankin (Stroke Patients Only)       Balance Overall balance assessment: Needs assistance Sitting-balance support: Feet supported;No upper extremity supported Sitting balance-Leahy Scale: Good     Standing balance support: Bilateral upper extremity supported;During functional activity Standing balance-Leahy Scale: Fair              Pertinent Vitals/Pain Pain Assessment: 0-10 Pain Score: 8  Pain Location: Lt hip Pain Descriptors / Indicators: Sore;Aching Pain Intervention(s): Monitored during session;Limited activity within patient's tolerance;Patient requesting pain meds-RN notified    Home Living Family/patient expects to be discharged to:: Private residence Living Arrangements: Alone Available Help at Discharge: Family;Available 24 hours/day(pt's daughter around for a few days and her son lives locally) Type of Home: Apartment Home Access: Level entry;Stairs to enter Entrance Stairs-Rails: None Entrance Stairs-Number of Steps: 1 curb then level entry into home Home Layout: One level Home Equipment: Walker - 2 wheels;Grab bars - toilet Additional Comments: pt s son lives in town , daughter  is coming down from [place] for a few days    Prior Function Level of Independence: Independent with assistive  device(s)         Comments: used RW     Hand Dominance   Dominant Hand: Right    Extremity/Trunk Assessment   Upper Extremity Assessment Upper Extremity Assessment: Overall WFL for tasks assessed    Lower Extremity Assessment Lower Extremity Assessment: Overall WFL for tasks assessed;LLE deficits/detail RLE: Unable to fully assess due to pain LLE Deficits / Details: pt limited by pain, good quad activation in supine LLE Sensation: WNL LLE Coordination: WNL    Cervical / Trunk Assessment Cervical / Trunk Assessment: Normal  Communication   Communication: No difficulties  Cognition Arousal/Alertness: Awake/alert Behavior During Therapy: WFL for tasks assessed/performed Overall Cognitive Status: Within Functional Limits for tasks assessed             General Comments      Exercises     Assessment/Plan    PT Assessment Patient needs continued PT services  PT Problem List Decreased strength;Decreased activity tolerance;Decreased range of motion;Decreased mobility;Pain;Decreased balance       PT Treatment Interventions DME instruction;Gait training;Functional mobility training;Therapeutic activities;Therapeutic exercise;Patient/family education;Stair training;Balance training    PT Goals (Current goals can be found in the Care Plan section)  Acute Rehab PT Goals Patient Stated Goal: to wlk without device more independently PT Goal Formulation: With patient Time For Goal Achievement: 09/13/19 Potential to Achieve Goals: Good    Frequency 7X/week    AM-PAC PT "6 Clicks" Mobility  Outcome Measure Help needed turning from your back to your side while in a flat bed without using bedrails?: A Little Help needed moving from lying on your back to sitting on the side of a flat bed without using bedrails?: A Little Help needed moving to and from a bed to a chair (including a wheelchair)?: A Little Help needed standing up from a chair using your arms (e.g.,  wheelchair or bedside chair)?: A Little Help needed to walk in hospital room?: A Little Help needed climbing 3-5 steps with a railing? : A Lot 6 Click Score: 17    End of Session Equipment Utilized During Treatment: Gait belt Activity Tolerance: Patient tolerated treatment well;Patient limited by fatigue Patient left: in chair;with call bell/phone within reach;with nursing/sitter in room Nurse Communication: Mobility status PT Visit Diagnosis: Muscle weakness (generalized) (M62.81);Difficulty in walking, not elsewhere classified (R26.2);Pain Pain - Right/Left: Left Pain - part of body: Hip    Time: 9629-5284 PT Time Calculation (min) (ACUTE ONLY): 22 min   Charges:   PT Evaluation $PT Eval Low Complexity: 1 Low         Renaldo Fiddler PT, DPT Physical Therapist with [facility]  09/06/2019 6:58 PM

## 2019-09-06 NOTE — Transfer of Care (Signed)
Immediate Anesthesia Transfer of Care Note  Patient: Jennifer [name]  Procedure(s) Performed: TOTAL HIP ARTHROPLASTY ANTERIOR APPROACH (Left Hip)  Patient Location: PACU  Anesthesia Type:General  Level of Consciousness: drowsy, patient cooperative and responds to stimulation  Airway & Oxygen Therapy: Patient Spontanous Breathing and Patient connected to face mask oxygen  Post-op Assessment: Report given to RN and Post -op Vital signs reviewed and stable  Post vital signs: Reviewed and stable  Last Vitals:  Vitals Value Taken Time  BP 127/78 09/06/19 1016  Temp    Pulse 88 09/06/19 1020  Resp 12 09/06/19 1020  SpO2 100 % 09/06/19 1020  Vitals shown include unvalidated device data.  Last Pain:  Vitals:   09/06/19 0634  TempSrc: Oral      Patients Stated Pain Goal: 4 (84/03/75 4360)  Complications: No apparent anesthesia complications

## 2019-09-06 NOTE — Discharge Instructions (Signed)

## 2019-09-06 NOTE — Anesthesia Preprocedure Evaluation (Addendum)
Anesthesia Evaluation  Patient identified by MRN, date of birth, ID band Patient awake    Reviewed: Allergy & Precautions, NPO status , Patient's Chart, lab work & pertinent test results  Airway Mallampati: II  TM Distance: >3 FB Neck ROM: Full    Dental  (+) Teeth Intact, Dental Advisory Given   Pulmonary asthma , COPD, former smoker,     + decreased breath sounds      Cardiovascular hypertension, Pt. on medications  Rhythm:Regular Rate:Normal     Neuro/Psych Anxiety negative neurological ROS     GI/Hepatic negative GI ROS, Neg liver ROS,   Endo/Other  Hypothyroidism   Renal/GU negative Renal ROS     Musculoskeletal  (+) Arthritis ,   Abdominal Normal abdominal exam  (+)   Peds  Hematology   Anesthesia Other Findings   Reproductive/Obstetrics                            Anesthesia Physical Anesthesia Plan  ASA: III  Anesthesia Plan: General   Post-op Pain Management:    Induction: Intravenous  PONV Risk Score and Plan: 4 or greater and Ondansetron, Dexamethasone, Treatment may vary due to age or medical condition and TIVA  Airway Management Planned: LMA  Additional Equipment: None  Intra-op Plan:   Post-operative Plan: Extubation in OR  Informed Consent: I have reviewed the patients History and Physical, chart, labs and discussed the procedure including the risks, benefits and alternatives for the proposed anesthesia with the patient or authorized representative who has indicated his/her understanding and acceptance.     Dental advisory given  Plan Discussed with: CRNA  Anesthesia Plan Comments: (Pt refuses OETT at all cost. No spinal due to questionable family history of von Willebrand's disease. )      Anesthesia Quick Evaluation

## 2019-09-06 NOTE — Anesthesia Procedure Notes (Signed)
Procedure Name: LMA Insertion Date/Time: 09/06/2019 8:32 AM Performed by: Glory Buff, CRNA Pre-anesthesia Checklist: Patient identified, Emergency Drugs available, Suction available and Patient being monitored Patient Re-evaluated:Patient Re-evaluated prior to induction Oxygen Delivery Method: Circle system utilized Preoxygenation: Pre-oxygenation with 100% oxygen Induction Type: IV induction LMA: LMA inserted LMA Size: 4.0 Number of attempts: 1 Placement Confirmation: CO2 detector Tube secured with: Tape Dental Injury: Teeth and Oropharynx as per pre-operative assessment

## 2019-09-06 NOTE — Op Note (Signed)
NAME:  Jennifer [name] NO.: [id number]      MEDICAL RECORD NO.: [id number]      FACILITY:  [facility]      PHYSICIAN:  Shelda Pal  DATE OF BIRTH:  Jul 02, 1946     DATE OF PROCEDURE:  09/06/2019                                 OPERATIVE REPORT         PREOPERATIVE DIAGNOSIS: Left  hip osteoarthritis.      POSTOPERATIVE DIAGNOSIS:  Left hip osteoarthritis.      PROCEDURE:  Left total hip replacement through an anterior approach   utilizing DePuy THR system, component size 52 pinnacle cup, a size 36+4 neutral   Altrex liner, a size 9 Hi Actis stem with a 36+1.5 delta ceramic   ball.      SURGEON:  Madlyn Frankel. Charlann Boxer, M.D.      ASSISTANT:  Lanney Gins, PA-C     ANESTHESIA:  General.      SPECIMENS:  None.      COMPLICATIONS:  None.      BLOOD LOSS:  600 cc     DRAINS:  None.      INDICATION OF THE PROCEDURE:  Jennifer [name] is a 73 y.o. female who had   presented to office for evaluation of left hip pain.  Radiographs revealed   progressive degenerative changes with bone-on-bone   articulation of the  hip joint, including subchondral cystic changes and osteophytes.  The patient had painful limited range of   motion significantly affecting their overall quality of life and function.  The patient was failing to    respond to conservative measures including medications and/or injections and activity modification and at this point was ready   to proceed with more definitive measures.  Consent was obtained for   benefit of pain relief.  Specific risks of infection, DVT, component   failure, dislocation, neurovascular injury, and need for revision surgery were reviewed in the office as well discussion of   the anterior versus posterior approach were reviewed.     PROCEDURE IN DETAIL:  The patient was brought to operative theater.   Once adequate anesthesia, preoperative antibiotics, 2 gm of Ancef, 1 gm of Tranexamic Acid, and 10  mg of Decadron were administered, the patient was positioned supine on the [employer] table.  Once the patient was safely positioned with adequate padding of boney prominences we predraped out the hip, and used fluoroscopy to confirm orientation of the pelvis.      The left hip was then prepped and draped from proximal iliac crest to   mid thigh with a shower curtain technique.      Time-out was performed identifying the patient, planned procedure, and the appropriate extremity.     An incision was then made 2 cm lateral to the   anterior superior iliac spine extending over the orientation of the   tensor fascia lata muscle and sharp dissection was carried down to the   fascia of the muscle.      The fascia was then incised.  The muscle belly was identified and swept   laterally and retractor placed along the superior neck.  Following   cauterization of the circumflex vessels and removing some pericapsular  fat, a second cobra retractor was placed on the inferior neck.  A T-capsulotomy was made along the line of the   superior neck to the trochanteric fossa, then extended proximally and   distally.  Tag sutures were placed and the retractors were then placed   intracapsular.  We then identified the trochanteric fossa and   orientation of my neck cut and then made a neck osteotomy with the femur on traction.  The femoral   head was removed without difficulty or complication.  Traction was let   off and retractors were placed posterior and anterior around the   acetabulum.      The labrum and foveal tissue were debrided.  I began reaming with a 45 mm   reamer and reamed up to 51 mm reamer with good bony bed preparation and a 52 mm  cup was chosen.  The final 52 mm Pinnacle cup was then impacted under fluoroscopy to confirm the depth of penetration and orientation with respect to   Abduction and forward flexion.  A screw was placed into the ilium followed by the hole eliminator.  The final    36+4 neutral Altrex liner was impacted with good visualized rim fit.  The cup was positioned anatomically within the acetabular portion of the pelvis.      At this point, the femur was rolled to 100 degrees.  Further capsule was   released off the inferior aspect of the femoral neck.  I then   released the superior capsule proximally.  With the leg in a neutral position the hook was placed laterally   along the femur under the vastus lateralis origin and elevated manually and then held in position using the hook attachment on the bed.  The leg was then extended and adducted with the leg rolled to 100   degrees of external rotation.  Retractors were placed along the medial calcar and posteriorly over the greater trochanter.  Once the proximal femur was fully   exposed, I used a box osteotome to set orientation.  I then began   broaching with the starting chili pepper broach and passed this by hand and then broached up to 8.  With the 8 broach in place I chose a high offset neck and did several trial reductions.  The offset was appropriate, leg lengths   appeared to be equal best matched with the +1.5 head ball trial confirmed radiographically.   Given these findings, I went ahead and dislocated the hip, repositioned all   retractors and positioned the right hip in the extended and abducted position.  The final 8 Hi Actis stem was   chosen and it was impacted down to the level of neck cut.  Based on this   and the trial reductions, a final 36+1.5 delta ceramic ball was chosen and   impacted onto a clean and dry trunnion, and the hip was reduced.  The   hip had been irrigated throughout the case again at this point.  I did   reapproximate the superior capsular leaflet to the anterior leaflet   using #1 Vicryl.  The fascia of the   tensor fascia lata muscle was then reapproximated using #1 Vicryl and #0 Stratafix sutures.  The   remaining wound was closed with 2-0 Vicryl and running 4-0 Monocryl.    The hip was cleaned, dried, and dressed sterilely using Dermabond and   Aquacel dressing.  The patient was then brought   to recovery room in  stable condition tolerating the procedure well.    Lanney GinsMatthew Babish, PA-C was present for the entirety of the case involved from   preoperative positioning, perioperative retractor management, general   facilitation of the case, as well as primary wound closure as assistant.            Madlyn FrankelMatthew D. Charlann Boxerlin, M.D.        09/06/2019 9:47 AM

## 2019-09-06 NOTE — Anesthesia Postprocedure Evaluation (Signed)
Anesthesia Post Note  Patient: Jennifer [name]  Procedure(s) Performed: TOTAL HIP ARTHROPLASTY ANTERIOR APPROACH (Left Hip)     Patient location during evaluation: PACU Anesthesia Type: General Level of consciousness: awake and alert Pain management: pain level controlled Vital Signs Assessment: post-procedure vital signs reviewed and stable Respiratory status: spontaneous breathing, nonlabored ventilation, respiratory function stable and patient connected to nasal cannula oxygen Cardiovascular status: blood pressure returned to baseline and stable Postop Assessment: no apparent nausea or vomiting Anesthetic complications: no    Last Vitals:  Vitals:   09/06/19 1513 09/06/19 1748  BP: 97/63 101/70  Pulse: (!) 107 (!) 103  Resp: 14 16  Temp: (!) 36.4 C 36.9 C  SpO2: 96% 100%    Last Pain:  Vitals:   09/06/19 1748  TempSrc: Oral  PainSc:                  [name]

## 2019-09-06 NOTE — Interval H&P Note (Signed)
History and Physical Interval Note:  09/06/2019 7:03 AM  Jennifer [name]  has presented today for surgery, with the diagnosis of Left hip osteoarthritis.  The various methods of treatment have been discussed with the patient and family. After consideration of risks, benefits and other options for treatment, the patient has consented to  Procedure(s) with comments: TOTAL HIP ARTHROPLASTY ANTERIOR APPROACH (Left) - 70 mins as a surgical intervention.  The patient's history has been reviewed, patient examined, no change in status, stable for surgery.  I have reviewed the patient's chart and labs.  Questions were answered to the patient's satisfaction.     Mauri Pole

## 2019-09-07 ENCOUNTER — Encounter: Payer: Self-pay | Admitting: *Deleted

## 2019-09-07 DIAGNOSIS — E785 Hyperlipidemia, unspecified: Secondary | ICD-10-CM | POA: Diagnosis not present

## 2019-09-07 DIAGNOSIS — J432 Centrilobular emphysema: Secondary | ICD-10-CM | POA: Diagnosis not present

## 2019-09-07 DIAGNOSIS — F419 Anxiety disorder, unspecified: Secondary | ICD-10-CM | POA: Diagnosis not present

## 2019-09-07 DIAGNOSIS — Z79899 Other long term (current) drug therapy: Secondary | ICD-10-CM | POA: Diagnosis not present

## 2019-09-07 DIAGNOSIS — E039 Hypothyroidism, unspecified: Secondary | ICD-10-CM | POA: Diagnosis not present

## 2019-09-07 DIAGNOSIS — M1612 Unilateral primary osteoarthritis, left hip: Secondary | ICD-10-CM | POA: Diagnosis not present

## 2019-09-07 DIAGNOSIS — Z7982 Long term (current) use of aspirin: Secondary | ICD-10-CM | POA: Diagnosis not present

## 2019-09-07 DIAGNOSIS — D680 Von Willebrand's disease: Secondary | ICD-10-CM | POA: Diagnosis not present

## 2019-09-07 DIAGNOSIS — I10 Essential (primary) hypertension: Secondary | ICD-10-CM | POA: Diagnosis not present

## 2019-09-07 LAB — CBC
HCT: 29.8 % — ABNORMAL LOW (ref 36.0–46.0)
Hemoglobin: 9.5 g/dL — ABNORMAL LOW (ref 12.0–15.0)
MCH: 33 pg (ref 26.0–34.0)
MCHC: 31.9 g/dL (ref 30.0–36.0)
MCV: 103.5 fL — ABNORMAL HIGH (ref 80.0–100.0)
Platelets: 142 10*3/uL — ABNORMAL LOW (ref 150–400)
RBC: 2.88 MIL/uL — ABNORMAL LOW (ref 3.87–5.11)
RDW: 13.7 % (ref 11.5–15.5)
WBC: 10 10*3/uL (ref 4.0–10.5)
nRBC: 0 % (ref 0.0–0.2)

## 2019-09-07 LAB — BASIC METABOLIC PANEL
Anion gap: 8 (ref 5–15)
BUN: 17 mg/dL (ref 8–23)
CO2: 26 mmol/L (ref 22–32)
Calcium: 9 mg/dL (ref 8.9–10.3)
Chloride: 101 mmol/L (ref 98–111)
Creatinine, Ser: 0.68 mg/dL (ref 0.44–1.00)
GFR calc Af Amer: >60 mL/min (ref >60)
GFR calc non Af Amer: >60 mL/min (ref >60)
Glucose, Bld: 127 mg/dL — ABNORMAL HIGH (ref 70–99)
Potassium: 3.3 mmol/L — ABNORMAL LOW (ref 3.5–5.1)
Sodium: 135 mmol/L (ref 135–145)

## 2019-09-07 MED ORDER — MOMETASONE FURO-FORMOTEROL FUM 200-5 MCG/ACT IN AERO
2.0000 | INHALATION_SPRAY | Freq: Two times a day (BID) | RESPIRATORY_TRACT | Status: DC
  Filled 2019-09-07: qty 8.8

## 2019-09-07 MED ORDER — MONTELUKAST SODIUM 10 MG PO TABS
10.0000 mg | ORAL_TABLET | Freq: Every day | ORAL | Status: DC
  Administered 2019-09-07: 10 mg via ORAL
  Filled 2019-09-07: qty 1

## 2019-09-07 MED ORDER — ALBUTEROL SULFATE HFA 108 (90 BASE) MCG/ACT IN AERS
1.0000 | INHALATION_SPRAY | RESPIRATORY_TRACT | Status: DC | PRN
  Administered 2019-09-07 (×2): 1 via RESPIRATORY_TRACT
  Filled 2019-09-07: qty 6.7

## 2019-09-07 NOTE — Progress Notes (Signed)
Physical Therapy Treatment Patient Details Name: Jennifer [name] MRN: [id number] DOB: 1946/07/25 Today's Date: 09/07/2019    History of Present Illness Patient is 73 y.o. female s/p Lt THA anterior approach on 09/06/19 with PMH significant for COPD, HTN, Rt THA in November, and white coat syndrome.    PT Comments    POD # 1 am session Pt OOB in recliner.  Assisted with amb and practiced one step.  General bed mobility comments: OOB in recliner. General transfer comment: 25% VC's on safety with turns.   General Gait Details: limited distance due to B UE weakness.  255 VC's on safety with turns and proper upright posture. Then returned to room to perform some TE's following HEP handout.  Instructed on proper tech, freq as well as use of ICE.   Addressed all mobility questions, discussed appropriate activity, educated on use of ICE.  Pt ready for D/C to home.   Follow Up Recommendations  Follow surgeon's recommendation for DC plan and follow-up therapies(HEP)     Equipment Recommendations  None recommended by PT    Recommendations for Other Services       Precautions / Restrictions Precautions Precautions: Fall Restrictions Weight Bearing Restrictions: No Other Position/Activity Restrictions: WBAT    Mobility  Bed Mobility               General bed mobility comments: OOB in recliner  Transfers Overall transfer level: Needs assistance Equipment used: Rolling walker (2 wheeled) Transfers: Sit to/from [employer] Sit to Stand: Supervision Stand pivot transfers: Supervision;Min guard       General transfer comment: 25% VC's on safety with turns  Ambulation/Gait Ambulation/Gait assistance: Supervision Gait Distance (Feet): 18 Feet Assistive device: Rolling walker (2 wheeled) Gait Pattern/deviations: Step-to pattern;Decreased step length - right;Decreased step length - left;Decreased stride length;Decreased weight shift to left Gait velocity:  decr   General Gait Details: limited distance due to B UE weakness.  255 VC's on safety with turns and proper upright posture.   Stairs Stairs: Yes Stairs assistance: Supervision;Min guard Stair Management: No rails;Step to pattern;Forwards;With walker Number of Stairs: 1 General stair comments: practiced one step twice with 25% VC's on proper walker placement   Wheelchair Mobility    Modified Rankin (Stroke Patients Only)       Balance                                            Cognition Arousal/Alertness: Awake/alert Behavior During Therapy: WFL for tasks assessed/performed Overall Cognitive Status: Within Functional Limits for tasks assessed                                        Exercises Total Hip Replacement TE's 10 reps ankle pumps 10 reps knee presses 10 reps heel slides 10 reps SAQ's 10 reps ABD Followed by ICE     General Comments        Pertinent Vitals/Pain Pain Assessment: 0-10 Pain Score: 5  Pain Location: L hip Pain Descriptors / Indicators: Sore;Aching;Tender Pain Intervention(s): Monitored during session;Premedicated before session;Repositioned;Ice applied    Home Living                      Prior Function  PT Goals (current goals can now be found in the care plan section) Progress towards PT goals: Progressing toward goals    Frequency    7X/week      PT Plan Current plan remains appropriate    Co-evaluation              AM-PAC PT "6 Clicks" Mobility   Outcome Measure  Help needed turning from your back to your side while in a flat bed without using bedrails?: None Help needed moving from lying on your back to sitting on the side of a flat bed without using bedrails?: None Help needed moving to and from a bed to a chair (including a wheelchair)?: None Help needed standing up from a chair using your arms (e.g., wheelchair or bedside chair)?: None Help needed to  walk in hospital room?: None Help needed climbing 3-5 steps with a railing? : A Little 6 Click Score: 23    End of Session Equipment Utilized During Treatment: Gait belt Activity Tolerance: Patient tolerated treatment well Patient left: in chair;with call bell/phone within reach;with nursing/sitter in room Nurse Communication: Mobility status(pt passed PT) PT Visit Diagnosis: Muscle weakness (generalized) (M62.81);Difficulty in walking, not elsewhere classified (R26.2);Pain Pain - Right/Left: Left Pain - part of body: Hip     Time: 1022-1049 PT Time Calculation (min) (ACUTE ONLY): 27 min  Charges:  $Gait Training: 8-22 mins $Therapeutic Exercise: 8-22 mins                     Rica Koyanagi  PTA Acute  Rehabilitation Services Pager      [phone] Office      [phone]

## 2019-09-07 NOTE — Progress Notes (Signed)
Patient ID: Jennifer [name], female   DOB: 21-Aug-1946, 73 y.o.   MRN: [id number] Subjective: 1 Day Post-Op Procedure(s) (LRB): TOTAL HIP ARTHROPLASTY ANTERIOR APPROACH (Left)    Patient reports pain as mild.  No events.  Was planning on same day discharge yesterday but had a hard time waking up from anesthesia.  Already been up walking to restroom  Objective:   VITALS:   Vitals:   09/07/19 0402 09/07/19 0622  BP:  (!) 94/56  Pulse: (!) 101 100  Resp:  16  Temp:  98.4 F (36.9 C)  SpO2:  96%    Neurovascular intact Incision: dressing C/D/I  LABS Recent Labs    09/07/19 0445  HGB 9.5*  HCT 29.8*  WBC 10.0  PLT 142*    Recent Labs    09/07/19 0445  NA 135  K 3.3*  BUN 17  CREATININE 0.68  GLUCOSE 127*    No results for input(s): LABPT, INR in the last 72 hours.   Assessment/Plan: 1 Day Post-Op Procedure(s) (LRB): TOTAL HIP ARTHROPLASTY ANTERIOR APPROACH (Left)   Advance diet Up with therapy  Home later today when her ride gets here Post op appt already set for 1/9 Reviewed her goals

## 2019-09-11 NOTE — Discharge Summary (Signed)
Physician Discharge Summary  Patient ID: Jennifer [name] MRN: [id number] DOB/AGE: [birth year] 74 y.o.  Admit date: 09/06/2019 Discharge date: 09/07/2019   Procedures:  Procedure(s) (LRB): TOTAL HIP ARTHROPLASTY ANTERIOR APPROACH (Left)  Attending Physician:  Dr. Durene Romans   Admission Diagnoses:   Left hip primary OA / pain  Discharge Diagnoses:  Principal Problem:   S/P left THA, AA Active Problems:   S/P revision of total knee, right  Past Medical History:  Diagnosis Date  . Anxiety   . Arthritis   . Asthma   . COPD (chronic obstructive pulmonary disease) (HCC) 11/03/2018  . Dyspnea    aT TIMES WITH ASTHMA ATTACKS  . Familial hypercholanemia 11/03/2018  . HLD (hyperlipidemia) 11/03/2018  . HTN (hypertension) 11/03/2018  . Hypothyroid 11/03/2018  . Pneumonia   . Vitamin D deficiency 11/03/2018  . Von Willebrand disease (HCC) possible 11/03/2018   Seen in documentation from prior provider    HPI:    Jennifer [name], 74 y.o. female, has a history of pain and functional disability in the left hip(s) due to arthritis and patient has failed non-surgical conservative treatments for greater than 12 weeks to include NSAID's and/or analgesics, corticosteriod injections, use of assistive devices and activity modification.  Onset of symptoms was gradual starting <1 year ago with gradually worsening course since that time.The patient noted prior procedures of the hip to include arthroplasty on the right hip per Dr. Charlann Boxer on 114/24/2020.  Patient currently rates pain in the left hip at 8 out of 10 with activity. Patient has worsening of pain with activity and weight bearing, trendelenberg gait, pain that interfers with activities of daily living and pain with passive range of motion. Patient has evidence of periarticular osteophytes and joint space narrowing by imaging studies. This condition presents safety issues increasing the risk of falls. There is no current active infection.   Risks, benefits and expectations were discussed with the patient.  Risks including but not limited to the risk of anesthesia, blood clots, nerve damage, blood vessel damage, failure of the prosthesis, infection and up to and including death.  Patient understand the risks, benefits and expectations and wishes to proceed with surgery.   PCP: Rodolph Bong, MD   Discharged Condition: good  Hospital Course:  Patient underwent the above stated procedure on 09/06/2019. Patient tolerated the procedure well and brought to the recovery room in good condition and subsequently to the floor.  POD #1 BP: 94/56 ; Pulse: 100 ; Temp: 98.4 F (36.9 C) ; Resp: 16 Patient reports pain as mild.  No events.  Was planning on same day discharge yesterday but had a hard time waking up from anesthesia.  Already been up walking to restroom.  Ready to be discharged home.  Neurovascular intact and incision: dressing C/D/I.   LABS  Basename    HGB     9.5  HCT     29.8    Discharge Exam: General appearance: alert, cooperative and no distress Extremities: Homans sign is negative, no sign of DVT, no edema, redness or tenderness in the calves or thighs and no ulcers, gangrene or trophic changes  Disposition: Home with follow up in 2 weeks   Follow-up Information    Durene Romans, MD. Schedule an appointment as soon as possible for a visit in 2 weeks.   Specialty: Orthopedic Surgery Contact information: [address] [place] [address] [place] [address] [phone]           Discharge Instructions  Call MD / Call 911   Complete by: As directed    If you experience chest pain or shortness of breath, CALL 911 and be transported to the hospital emergency room.  If you develope a fever above 101 F, pus (white drainage) or increased drainage or redness at the wound, or calf pain, call your surgeon's office.   Change dressing   Complete by: As directed    Maintain surgical dressing until follow up in the  clinic. If the edges start to pull up, may reinforce with tape. If the dressing is no longer working, may remove and cover with gauze and tape, but must keep the area dry and clean.  Call with any questions or concerns.   Constipation Prevention   Complete by: As directed    Drink plenty of fluids.  Prune juice may be helpful.  You may use a stool softener, such as Colace (over the counter) 100 mg twice a day.  Use MiraLax (over the counter) for constipation as needed.   Diet - low sodium heart healthy   Complete by: As directed    Discharge instructions   Complete by: As directed    Maintain surgical dressing until follow up in the clinic. If the edges start to pull up, may reinforce with tape. If the dressing is no longer working, may remove and cover with gauze and tape, but must keep the area dry and clean.  Follow up in 2 weeks at [facility]. Call with any questions or concerns.   Increase activity slowly as tolerated   Complete by: As directed    Weight bearing as tolerated with assist device (walker, cane, etc) as directed, use it as long as suggested by your surgeon or therapist, typically at least 4-6 weeks.   TED hose   Complete by: As directed    Use stockings (TED hose) for 2 weeks on both leg(s).  You may remove them at night for sleeping.      Allergies as of 09/07/2019      Reactions   Prednisone Other (See Comments)   psychological reaction, hallucinations, Paranoia   Grass Pollen(k-o-r-t-swt Vern) Other (See Comments)   Statins Other (See Comments)   Other reaction(s): muscle/joint pain Weakness and tiredness      Medication List    STOP taking these medications   acetaminophen 650 MG CR tablet Commonly known as: TYLENOL     TAKE these medications   albuterol 108 (90 Base) MCG/ACT inhaler Commonly known as: VENTOLIN HFA Inhale 1-2 puffs into the lungs every 4 (four) hours as needed for wheezing or shortness of breath.   aspirin 81 MG chewable  tablet Commonly known as: Aspirin Childrens Chew 1 tablet (81 mg total) by mouth 2 (two) times daily. Take for 4 weeks, then resume regular dose.   chlorpheniramine 4 MG tablet Commonly known as: CHLOR-TRIMETON Take 4 mg by mouth daily.   cyclobenzaprine 5 MG tablet Commonly known as: FLEXERIL Take 1 tablet (5 mg total) by mouth 3 (three) times daily as needed for muscle spasms.   docusate sodium 100 MG capsule Commonly known as: Colace Take 1 capsule (100 mg total) by mouth 2 (two) times daily. What changed: when to take this   ferrous sulfate 325 (65 FE) MG tablet Commonly known as: FerrouSul Take 1 tablet (325 mg total) by mouth 3 (three) times daily with meals for 14 days.   HYDROcodone-acetaminophen 7.5-325 MG tablet Commonly known as: Norco Take 1-2 tablets by mouth  every 4 (four) hours as needed for moderate pain. What changed: how much to take   lisinopril-hydrochlorothiazide 10-12.5 MG tablet Commonly known as: ZESTORETIC Take 1 tablet by mouth daily.   montelukast 10 MG tablet Commonly known as: SINGULAIR Take 1 tablet (10 mg total) by mouth at bedtime.   polyethylene glycol 17 g packet Commonly known as: MIRALAX / GLYCOLAX Take 17 g by mouth 2 (two) times daily.   Symbicort 160-4.5 MCG/ACT inhaler Generic drug: budesonide-formoterol TAKE 2 PUFFS BY MOUTH TWICE A DAY What changed: See the new instructions.            Discharge Care Instructions  (From admission, onward)         Start     Ordered   09/06/19 0000  Change dressing    Comments: Maintain surgical dressing until follow up in the clinic. If the edges start to pull up, may reinforce with tape. If the dressing is no longer working, may remove and cover with gauze and tape, but must keep the area dry and clean.  Call with any questions or concerns.   09/06/19 [id number]           Signed: [place]. Mohammed Mcandrew   PA-C  09/11/2019, 9:39 PM

## 2019-09-28 ENCOUNTER — Other Ambulatory Visit: Payer: Self-pay | Admitting: Osteopathic Medicine

## 2019-10-20 DIAGNOSIS — Z96642 Presence of left artificial hip joint: Secondary | ICD-10-CM | POA: Diagnosis not present

## 2019-10-20 DIAGNOSIS — Z471 Aftercare following joint replacement surgery: Secondary | ICD-10-CM | POA: Diagnosis not present

## 2019-11-09 DIAGNOSIS — H52209 Unspecified astigmatism, unspecified eye: Secondary | ICD-10-CM | POA: Diagnosis not present

## 2019-11-09 DIAGNOSIS — H524 Presbyopia: Secondary | ICD-10-CM | POA: Diagnosis not present

## 2019-11-09 DIAGNOSIS — H269 Unspecified cataract: Secondary | ICD-10-CM | POA: Diagnosis not present

## 2019-11-09 DIAGNOSIS — H5203 Hypermetropia, bilateral: Secondary | ICD-10-CM | POA: Diagnosis not present

## 2019-12-05 ENCOUNTER — Telehealth: Payer: Self-pay

## 2019-12-05 NOTE — Telephone Encounter (Signed)
It does not look like she has been seen in a while and a lot looks like it has happened between her last visit with Dr Denyse Amass and now. Are you comfortable to pre-order labs or do you think patient needs to establish with you first to make sure we are ordering appropriate labs?

## 2019-12-05 NOTE — Telephone Encounter (Signed)
I would prefer to see her first before ordering so that we make sure and get everything she needs.

## 2019-12-05 NOTE — Telephone Encounter (Signed)
Patient would like labs sent in a week before appointment with new PCP. Was a former patient of Dr.Corey. Please call once orders are in.

## 2019-12-06 NOTE — Telephone Encounter (Signed)
Patient has been made aware. No further questions at this time.  

## 2019-12-06 NOTE — Telephone Encounter (Signed)
Let pt know we need to do appt prior to labs

## 2019-12-26 ENCOUNTER — Encounter: Payer: Self-pay | Admitting: Family Medicine

## 2019-12-26 ENCOUNTER — Ambulatory Visit (INDEPENDENT_AMBULATORY_CARE_PROVIDER_SITE_OTHER): Payer: Medicare HMO | Admitting: Family Medicine

## 2019-12-26 ENCOUNTER — Other Ambulatory Visit: Payer: Self-pay

## 2019-12-26 DIAGNOSIS — J432 Centrilobular emphysema: Secondary | ICD-10-CM

## 2019-12-26 DIAGNOSIS — I10 Essential (primary) hypertension: Secondary | ICD-10-CM

## 2019-12-26 DIAGNOSIS — E785 Hyperlipidemia, unspecified: Secondary | ICD-10-CM

## 2019-12-26 DIAGNOSIS — J449 Chronic obstructive pulmonary disease, unspecified: Secondary | ICD-10-CM

## 2019-12-26 DIAGNOSIS — E039 Hypothyroidism, unspecified: Secondary | ICD-10-CM | POA: Diagnosis not present

## 2019-12-26 MED ORDER — ALBUTEROL SULFATE HFA 108 (90 BASE) MCG/ACT IN AERS: 1.0000 | INHALATION_SPRAY | RESPIRATORY_TRACT | 6 refills | Status: DC | PRN

## 2019-12-26 NOTE — Progress Notes (Signed)
Jennifer [name] - 74 y.o. female MRN [id number]  Date of birth: 08/16/1946  Subjective Chief Complaint  Patient presents with  . Follow-up    HPI Jennifer [name] is a 74 y.o. female with history of hypertension, hypothyroidism, COPD and HLD here today for follow up and meed new pcp.  She had bilateral hip arthroplasty late last year.  She has done remarkably well since having this completed.    She does have a history of COPD with asthma as well which she manages with singulair and symbicort daily and albuterol as needed.  She is doing well with this but needs updated albuterol rx.  She quit smoking in 2015  BP has remained well controlled with lisinopril/hctz.  She is doing well with this.  She denies symptoms related to HTN including chest pain, shortness of breath, palpitations, headache or vision changes.   ROS:  A comprehensive ROS was completed and negative except as noted per HPI  Allergies  Allergen Reactions  . Prednisone Other (See Comments)    psychological reaction, hallucinations, Paranoia  . Grass Pollen(K-O-R-T-Swt Vern) Other (See Comments)  . Statins Other (See Comments)    Other reaction(s): muscle/joint pain Weakness and tiredness    Past Medical History:  Diagnosis Date  . Anxiety   . Arthritis   . Asthma   . COPD (chronic obstructive pulmonary disease) ([place]) 11/03/2018  . Dyspnea    aT TIMES WITH ASTHMA ATTACKS  . Familial hypercholanemia 11/03/2018  . HLD (hyperlipidemia) 11/03/2018  . HTN (hypertension) 11/03/2018  . Hypothyroid 11/03/2018  . Pneumonia   . Vitamin D deficiency 11/03/2018  . Von Willebrand disease ([place]) possible 11/03/2018   Seen in documentation from prior provider    Past Surgical History:  Procedure Laterality Date  . APPENDECTOMY  1978  . BIOPSY ENDOMETRIAL    . CESAREAN SECTION     triplets  . TOTAL HIP ARTHROPLASTY Right 08/02/2019   Procedure: TOTAL  RIGHT  HIP ARTHROPLASTY ANTERIOR APPROACH;  Surgeon: Paralee Cancel, MD;   Location: [facility];  Service: Orthopedics;  Laterality: Right;  70 mins  . TOTAL HIP ARTHROPLASTY Left 09/06/2019   Procedure: TOTAL HIP ARTHROPLASTY ANTERIOR APPROACH;  Surgeon: Paralee Cancel, MD;  Location: [facility];  Service: Orthopedics;  Laterality: Left;  70 mins  . TUBAL LIGATION      Social History   Socioeconomic History  . Marital status: Single    Spouse name: Not on file  . Number of children: Not on file  . Years of education: Not on file  . Highest education level: Not on file  Occupational History  . Not on file  Tobacco Use  . Smoking status: Former Smoker    Packs/day: 0.50    Years: 15.00    Pack years: 7.50    Types: Cigarettes    Quit date: 07/22/2014    Years since quitting: 5.4  . Smokeless tobacco: Never Used  Substance and Sexual Activity  . Alcohol use: Not Currently  . Drug use: Never  . Sexual activity: Not Currently    Birth control/protection: Abstinence  Other Topics Concern  . Not on file  Social History Narrative  . Not on file   Social Determinants of Health   Financial Resource Strain:   . Difficulty of Paying Living Expenses:   Food Insecurity:   . Worried About Charity fundraiser in the Last Year:   . Arboriculturist in the Last Year:   Transportation Needs:   .  Lack of Transportation (Medical):   Marland Kitchen Lack of Transportation (Non-Medical):   Physical Activity:   . Days of Exercise per Week:   . Minutes of Exercise per Session:   Stress:   . Feeling of Stress :   Social Connections:   . Frequency of Communication with Friends and Family:   . Frequency of Social Gatherings with Friends and Family:   . Attends Religious Services:   . Active Member of Clubs or Organizations:   . Attends Banker Meetings:   Marland Kitchen Marital Status:     Family History  Problem Relation Age of Onset  . Parkinson's disease Mother   . Cancer Father        prostate  . Asthma Brother     Health Maintenance  Topic Date Due  . MAMMOGRAM   Never done  . COLONOSCOPY  Never done  . DEXA SCAN  12/25/2020 (Originally 07/22/2013)  . PNA vac Low Risk Adult (1 of 2 - PCV13) 12/25/2020 (Originally 07/22/2013)  . TETANUS/TDAP  03/04/2027  . COVID-19 Vaccine  Completed  . Hepatitis C Screening  Completed  . INFLUENZA VACCINE  Discontinued     ----------------------------------------------------------------------------------------------------------------------------------------------------------------------------------------------------------------- Physical Exam BP 140/87   Pulse 93   Ht 5\' 6"  (1.676 m)   Wt 164 lb (74.4 kg)   BMI 26.47 kg/m   Physical Exam Constitutional:      Appearance: Normal appearance.  HENT:     Head: Normocephalic and atraumatic.     Mouth/Throat:     Mouth: Mucous membranes are moist.  Eyes:     General: No scleral icterus. Cardiovascular:     Rate and Rhythm: Normal rate and regular rhythm.  Pulmonary:     Effort: Pulmonary effort is normal.     Breath sounds: Normal breath sounds.  Musculoskeletal:     Cervical back: Neck supple.  Skin:    General: Skin is warm and dry.  Neurological:     General: No focal deficit present.     Mental Status: She is alert.  Psychiatric:        Mood and Affect: Mood normal.        Behavior: Behavior normal.     ------------------------------------------------------------------------------------------------------------------------------------------------------------------------------------------------------------------- Assessment and Plan  Hypertension goal BP (blood pressure) < 140/90 Blood pressure is at goal at for age and co-morbidities.  I recommend she continue lisinopril/hctz.  In addition they were instructed to follow a low sodium diet with regular exercise to help to maintain adequate control of blood pressure.    COPD (chronic obstructive pulmonary disease) (HCC) Stable at this time.  She does have asthma component as well.  Albuterol  refilled  She will continue singulair and symbicort for maintenance  Hypothyroid TSH has remained stable.   HLD (hyperlipidemia) Intolerant to statins.  Continue to monitor with routine lab work.    Meds ordered this encounter  Medications  . albuterol (VENTOLIN HFA) 108 (90 Base) MCG/ACT inhaler    Sig: Inhale 1-2 puffs into the lungs every 4 (four) hours as needed for wheezing or shortness of breath.    Dispense:  16 g    Refill:  6    No follow-ups on file.    This visit occurred during the SARS-CoV-2 public health emergency.  Safety protocols were in place, including screening questions prior to the visit, additional usage of staff PPE, and extensive cleaning of exam room while observing appropriate contact time as indicated for disinfecting solutions.

## 2019-12-26 NOTE — Assessment & Plan Note (Signed)
Intolerant to statins.  Continue to monitor with routine lab work.

## 2019-12-26 NOTE — Assessment & Plan Note (Signed)
Stable at this time.  She does have asthma component as well.  Albuterol refilled  She will continue singulair and symbicort for maintenance

## 2019-12-26 NOTE — Assessment & Plan Note (Signed)
TSH has remained stable.

## 2019-12-26 NOTE — Assessment & Plan Note (Signed)
Blood pressure is at goal at for age and co-morbidities.  I recommend she continue lisinopril/hctz.  In addition they were instructed to follow a low sodium diet with regular exercise to help to maintain adequate control of blood pressure.

## 2020-04-16 ENCOUNTER — Telehealth: Payer: Self-pay | Admitting: Family Medicine

## 2020-04-16 ENCOUNTER — Other Ambulatory Visit: Payer: Self-pay

## 2020-04-16 DIAGNOSIS — E782 Mixed hyperlipidemia: Secondary | ICD-10-CM

## 2020-04-16 DIAGNOSIS — Z0000 Encounter for general adult medical examination without abnormal findings: Secondary | ICD-10-CM

## 2020-04-16 DIAGNOSIS — I10 Essential (primary) hypertension: Secondary | ICD-10-CM

## 2020-04-16 DIAGNOSIS — E039 Hypothyroidism, unspecified: Secondary | ICD-10-CM

## 2020-04-16 MED ORDER — LISINOPRIL-HYDROCHLOROTHIAZIDE 10-12.5 MG PO TABS: 1.0000 | ORAL_TABLET | Freq: Every day | ORAL | 0 refills | Status: DC

## 2020-04-16 NOTE — Telephone Encounter (Signed)
Patient would like a refill on lisinopril-hydrochlorothiazide (ZESTORETIC) 10-12.5 MG tablet [[id number]] to the pharmacy on file. States she only has a few pills. Would like a refill until her next appointment in October. Please advise.

## 2020-04-16 NOTE — Telephone Encounter (Signed)
Let patient know about lab request. She was wondering if her medication was sent in, or enough until next appointment.

## 2020-04-16 NOTE — Telephone Encounter (Signed)
Will also like blood work sent in a week before and states " So PCP and her have something to talk about".

## 2020-04-16 NOTE — Telephone Encounter (Signed)
Task completed. Pt should be getting a call from [facility] regarding her med refill.

## 2020-04-16 NOTE — Progress Notes (Signed)
Pt requested annual labs to be ordered. Task completed.

## 2020-04-16 NOTE — Telephone Encounter (Signed)
Task completed. At pt's request lab work ordered. Pls update the pt. Thanks in advance.

## 2020-04-17 NOTE — Telephone Encounter (Signed)
Patient is aware. No further questions.  ?

## 2020-05-24 ENCOUNTER — Telehealth: Payer: Self-pay | Admitting: Family Medicine

## 2020-05-24 NOTE — Telephone Encounter (Signed)
States she does not want to in for a visit. She will tough it out.

## 2020-05-24 NOTE — Telephone Encounter (Signed)
Patient states she knows she has a UTI and wanting to know if she can get bactrim for this. Has been prescribed this before. Please advise. *Declined for an office visit*

## 2020-05-24 NOTE — Telephone Encounter (Signed)
Recommend visit as I have never seen her for this.

## 2020-06-21 ENCOUNTER — Other Ambulatory Visit: Payer: Self-pay | Admitting: Family Medicine

## 2020-06-22 DIAGNOSIS — I10 Essential (primary) hypertension: Secondary | ICD-10-CM | POA: Diagnosis not present

## 2020-06-22 DIAGNOSIS — E039 Hypothyroidism, unspecified: Secondary | ICD-10-CM | POA: Diagnosis not present

## 2020-06-22 DIAGNOSIS — Z0000 Encounter for general adult medical examination without abnormal findings: Secondary | ICD-10-CM | POA: Diagnosis not present

## 2020-06-22 DIAGNOSIS — E782 Mixed hyperlipidemia: Secondary | ICD-10-CM | POA: Diagnosis not present

## 2020-06-23 LAB — URINALYSIS, ROUTINE W REFLEX MICROSCOPIC
Bilirubin Urine: NEGATIVE
Glucose, UA: NEGATIVE
Hgb urine dipstick: NEGATIVE
Hyaline Cast: NONE SEEN /LPF
Ketones, ur: NEGATIVE
Nitrite: POSITIVE — AB
Protein, ur: NEGATIVE
RBC / HPF: NONE SEEN /HPF (ref 0–2)
Specific Gravity, Urine: 1.009 (ref 1.001–1.03)
pH: 7 (ref 5.0–8.0)

## 2020-06-23 LAB — TSH: TSH: 4.16 mIU/L (ref 0.40–4.50)

## 2020-06-23 LAB — LIPID PANEL W/REFLEX DIRECT LDL
Cholesterol: 183 mg/dL (ref <200)
HDL: 66 mg/dL (ref >=50)
LDL Cholesterol (Calc): 100 mg/dL (calc) — ABNORMAL HIGH
Non-HDL Cholesterol (Calc): 117 mg/dL (calc) (ref <130)
Total CHOL/HDL Ratio: 2.8 (calc) (ref <5.0)
Triglycerides: 77 mg/dL (ref <150)

## 2020-06-23 LAB — CBC
HCT: 37.7 % (ref 35.0–45.0)
Hemoglobin: 13 g/dL (ref 11.7–15.5)
MCH: 33.2 pg — ABNORMAL HIGH (ref 27.0–33.0)
MCHC: 34.5 g/dL (ref 32.0–36.0)
MCV: 96.4 fL (ref 80.0–100.0)
MPV: 9.5 fL (ref 7.5–12.5)
Platelets: 194 10*3/uL (ref 140–400)
RBC: 3.91 10*6/uL (ref 3.80–5.10)
RDW: 12.9 % (ref 11.0–15.0)
WBC: 6.1 10*3/uL (ref 3.8–10.8)

## 2020-06-23 LAB — BASIC METABOLIC PANEL WITH GFR
BUN: 23 mg/dL (ref 7–25)
CO2: 30 mmol/L (ref 20–32)
Calcium: 10.1 mg/dL (ref 8.6–10.4)
Chloride: 103 mmol/L (ref 98–110)
Creat: 0.76 mg/dL (ref 0.60–0.93)
GFR, Est African American: 91 mL/min/{1.73_m2} (ref >=60)
GFR, Est Non African American: 79 mL/min/{1.73_m2} (ref >=60)
Glucose, Bld: 92 mg/dL (ref 65–99)
Potassium: 4.1 mmol/L (ref 3.5–5.3)
Sodium: 140 mmol/L (ref 135–146)

## 2020-06-27 ENCOUNTER — Other Ambulatory Visit: Payer: Self-pay | Admitting: Family Medicine

## 2020-06-27 MED ORDER — SULFAMETHOXAZOLE-TRIMETHOPRIM 800-160 MG PO TABS: 1.0000 | ORAL_TABLET | Freq: Two times a day (BID) | ORAL | 0 refills | Status: AC

## 2020-06-28 ENCOUNTER — Ambulatory Visit (INDEPENDENT_AMBULATORY_CARE_PROVIDER_SITE_OTHER): Payer: Medicare HMO | Admitting: Family Medicine

## 2020-06-28 ENCOUNTER — Encounter: Payer: Self-pay | Admitting: Family Medicine

## 2020-06-28 DIAGNOSIS — N3090 Cystitis, unspecified without hematuria: Secondary | ICD-10-CM | POA: Insufficient documentation

## 2020-06-28 DIAGNOSIS — J449 Chronic obstructive pulmonary disease, unspecified: Secondary | ICD-10-CM

## 2020-06-28 DIAGNOSIS — Z9109 Other allergy status, other than to drugs and biological substances: Secondary | ICD-10-CM | POA: Diagnosis not present

## 2020-06-28 DIAGNOSIS — E039 Hypothyroidism, unspecified: Secondary | ICD-10-CM

## 2020-06-28 DIAGNOSIS — J432 Centrilobular emphysema: Secondary | ICD-10-CM

## 2020-06-28 DIAGNOSIS — I10 Essential (primary) hypertension: Secondary | ICD-10-CM

## 2020-06-28 MED ORDER — MONTELUKAST SODIUM 10 MG PO TABS: 10.0000 mg | ORAL_TABLET | Freq: Every day | ORAL | 3 refills | Status: DC

## 2020-06-28 MED ORDER — LISINOPRIL-HYDROCHLOROTHIAZIDE 10-12.5 MG PO TABS: 1.0000 | ORAL_TABLET | Freq: Every day | ORAL | 3 refills | Status: DC

## 2020-06-28 MED ORDER — SYMBICORT 160-4.5 MCG/ACT IN AERO: 2.0000 | INHALATION_SPRAY | Freq: Two times a day (BID) | RESPIRATORY_TRACT | 3 refills | Status: DC

## 2020-06-28 NOTE — Assessment & Plan Note (Signed)
Recent UA and symptoms consistent with UTI Rx for bactrim sent in.   She will let me know if not resolving with this.

## 2020-06-28 NOTE — Progress Notes (Signed)
Jennifer [name] - 74 y.o. female MRN [id number]  Date of birth: [birth year]  Subjective Chief Complaint  Patient presents with  . Urinary Tract Infection    HPI Jennifer [name] is a 73 y.o. female here today for follow up visit.  She has had UTI symptoms including frequency and incontinence for a few weeks.  UA consistent with UTI and she has been started on bactrim.  Otherwise she feels like she is doing well.    HTN has been well controlled with lisinopril/hctz.  She denies side effects from medication and she has not had any symptoms related to HTN including chest pain, shortness of breath, palpitations, headache or vision changes.    She also has a history of COPD/asthma.  She is taking symbicort and singulair daily with albuterol as needed.  She rarely need albuterol and feels that her maintenance medications are working well for her.  ROS:  A comprehensive ROS was completed and negative except as noted per HPI    Allergies  Allergen Reactions  . Prednisone Other (See Comments)    psychological reaction, hallucinations, Paranoia  . Grass Pollen(K-O-R-T-Swt Vern) Other (See Comments)  . Statins Other (See Comments)    Other reaction(s): muscle/joint pain Weakness and tiredness    Past Medical History:  Diagnosis Date  . Anxiety   . Arthritis   . Asthma   . COPD (chronic obstructive pulmonary disease) (HCC) 11/03/2018  . Dyspnea    aT TIMES WITH ASTHMA ATTACKS  . Familial hypercholanemia 11/03/2018  . HLD (hyperlipidemia) 11/03/2018  . HTN (hypertension) 11/03/2018  . Hypothyroid 11/03/2018  . Pneumonia   . Vitamin D deficiency 11/03/2018  . Von Willebrand disease (HCC) possible 11/03/2018   Seen in documentation from prior provider    Past Surgical History:  Procedure Laterality Date  . APPENDECTOMY  1978  . BIOPSY ENDOMETRIAL    . CESAREAN SECTION     triplets  . TOTAL HIP ARTHROPLASTY Right 08/02/2019   Procedure: TOTAL  RIGHT  HIP ARTHROPLASTY ANTERIOR APPROACH;   Surgeon: Durene Romans, MD;  Location: [facility];  Service: Orthopedics;  Laterality: Right;  70 mins  . TOTAL HIP ARTHROPLASTY Left 09/06/2019   Procedure: TOTAL HIP ARTHROPLASTY ANTERIOR APPROACH;  Surgeon: Durene Romans, MD;  Location: [facility];  Service: Orthopedics;  Laterality: Left;  70 mins  . TUBAL LIGATION      Social History   Socioeconomic History  . Marital status: Single    Spouse name: Not on file  . Number of children: Not on file  . Years of education: Not on file  . Highest education level: Not on file  Occupational History  . Not on file  Tobacco Use  . Smoking status: Former Smoker    Packs/day: 0.50    Years: 15.00    Pack years: 7.50    Types: Cigarettes    Quit date: 07/22/2014    Years since quitting: 5.9  . Smokeless tobacco: Never Used  Vaping Use  . Vaping Use: Never used  Substance and Sexual Activity  . Alcohol use: Not Currently  . Drug use: Never  . Sexual activity: Not Currently    Birth control/protection: Abstinence  Other Topics Concern  . Not on file  Social History Narrative  . Not on file   Social Determinants of Health   Financial Resource Strain:   . Difficulty of Paying Living Expenses: Not on file  Food Insecurity:   . Worried About Programme researcher, broadcasting/film/video in the Last  Year: Not on file  . Ran Out of Food in the Last Year: Not on file  Transportation Needs:   . Lack of Transportation (Medical): Not on file  . Lack of Transportation (Non-Medical): Not on file  Physical Activity:   . Days of Exercise per Week: Not on file  . Minutes of Exercise per Session: Not on file  Stress:   . Feeling of Stress : Not on file  Social Connections:   . Frequency of Communication with Friends and Family: Not on file  . Frequency of Social Gatherings with Friends and Family: Not on file  . Attends Religious Services: Not on file  . Active Member of Clubs or Organizations: Not on file  . Attends Banker Meetings: Not on file  .  Marital Status: Not on file    Family History  Problem Relation Age of Onset  . Parkinson's disease Mother   . Cancer Father        prostate  . Asthma Brother     Health Maintenance  Topic Date Due  . MAMMOGRAM  Never done  . COLONOSCOPY  Never done  . DEXA SCAN  12/25/2020 (Originally 07/22/2013)  . PNA vac Low Risk Adult (1 of 2 - PCV13) 12/25/2020 (Originally 07/22/2013)  . TETANUS/TDAP  03/04/2027  . COVID-19 Vaccine  Completed  . Hepatitis C Screening  Completed  . INFLUENZA VACCINE  Discontinued     ----------------------------------------------------------------------------------------------------------------------------------------------------------------------------------------------------------------- Physical Exam BP 138/83 (BP Location: Left Arm, Patient Position: Sitting, Cuff Size: Normal)   Pulse (!) 113   Temp (!) 97.5 F (36.4 C)   Wt 163 lb 9.6 oz (74.2 kg)   SpO2 94%   BMI 26.41 kg/m   Physical Exam Constitutional:      Appearance: Normal appearance.  HENT:     Head: Normocephalic and atraumatic.  Cardiovascular:     Rate and Rhythm: Normal rate and regular rhythm.  Pulmonary:     Effort: Pulmonary effort is normal.     Breath sounds: Normal breath sounds.  Neurological:     General: No focal deficit present.     Mental Status: She is alert.  Psychiatric:        Mood and Affect: Mood normal.        Behavior: Behavior normal.     ------------------------------------------------------------------------------------------------------------------------------------------------------------------------------------------------------------------- Assessment and Plan  Cystitis Recent UA and symptoms consistent with UTI Rx for bactrim sent in.   She will let me know if not resolving with this.   Hypothyroid Recent TSH normal.   COPD (chronic obstructive pulmonary disease) (HCC) This is stable with current maintenance regimen.  She has only  needed albuterol as few times this year.  Continue current medications.   Hypertension goal BP (blood pressure) < 140/90 Blood pressure is at goal at for age and co-morbidities.  I recommend continuation of lisnopril/hctz at current strength.  In addition they were instructed to follow a low sodium diet with regular exercise to help to maintain adequate control of blood pressure.     Meds ordered this encounter  Medications  . lisinopril-hydrochlorothiazide (ZESTORETIC) 10-12.5 MG tablet    Sig: Take 1 tablet by mouth daily.    Dispense:  90 tablet    Refill:  3  . SYMBICORT 160-4.5 MCG/ACT inhaler    Sig: Inhale 2 puffs into the lungs 2 (two) times daily.    Dispense:  30.6 g    Refill:  3  . montelukast (SINGULAIR) 10 MG tablet  Sig: Take 1 tablet (10 mg total) by mouth at bedtime.    Dispense:  90 tablet    Refill:  3    No follow-ups on file.    This visit occurred during the SARS-CoV-2 public health emergency.  Safety protocols were in place, including screening questions prior to the visit, additional usage of staff PPE, and extensive cleaning of exam room while observing appropriate contact time as indicated for disinfecting solutions.

## 2020-06-28 NOTE — Assessment & Plan Note (Signed)
Blood pressure is at goal at for age and co-morbidities.  I recommend continuation of lisnopril/hctz at current strength.  In addition they were instructed to follow a low sodium diet with regular exercise to help to maintain adequate control of blood pressure.

## 2020-06-28 NOTE — Assessment & Plan Note (Signed)
This is stable with current maintenance regimen.  She has only needed albuterol as few times this year.  Continue current medications.

## 2020-06-28 NOTE — Assessment & Plan Note (Signed)
Recent TSH normal.  

## 2020-07-06 DIAGNOSIS — Z96643 Presence of artificial hip joint, bilateral: Secondary | ICD-10-CM | POA: Diagnosis not present

## 2020-07-10 ENCOUNTER — Other Ambulatory Visit: Payer: Self-pay | Admitting: Orthopedic Surgery

## 2020-07-10 DIAGNOSIS — Z96643 Presence of artificial hip joint, bilateral: Secondary | ICD-10-CM

## 2020-10-04 ENCOUNTER — Ambulatory Visit (INDEPENDENT_AMBULATORY_CARE_PROVIDER_SITE_OTHER): Payer: Medicare HMO | Admitting: Sports Medicine

## 2020-10-04 ENCOUNTER — Other Ambulatory Visit: Payer: Self-pay

## 2020-10-04 ENCOUNTER — Ambulatory Visit (INDEPENDENT_AMBULATORY_CARE_PROVIDER_SITE_OTHER): Payer: Medicare HMO

## 2020-10-04 DIAGNOSIS — M65311 Trigger thumb, right thumb: Secondary | ICD-10-CM

## 2020-10-04 NOTE — Progress Notes (Signed)
    Procedures performed today:    Procedure: Real-time Ultrasound Guided injection of the right flexor pollicis longus tendon sheath Device: Samsung HS60  Verbal informed consent obtained.  Time-out conducted.  Noted no overlying erythema, induration, or other signs of local infection.  Skin prepped in a sterile fashion.  Local anesthesia: Topical Ethyl chloride.  With sterile technique and under real time ultrasound guidance:  1/2 cc lidocaine, 1/2 cc kenalog 40 injected easily.   Completed without difficulty  Advised to call if fevers/chills, erythema, induration, drainage, or persistent bleeding.  Images permanently stored and available for review in PACS.  Impression: Technically successful ultrasound guided injection.  Independent interpretation of notes and tests performed by another provider:   None.  Brief History, Exam, Impression, and Recommendations:    Trigger thumb, right thumb This is a very pleasant 75 year old female with increasing pain on the volar aspect of her right thumb, with visible and palpable triggering. She is really not tender over the IP or the MCP, only on the volar aspect. Today we performed a flexor pollicis longus tendon sheath injection, adding some home rehab exercises, return to see me in a month.    ___________________________________________ Ihor Austin. Benjamin Stain, M.D., ABFM., CAQSM. Primary Care and Sports Medicine [facility]  Adjunct Instructor of Family Medicine  [employer]

## 2020-10-04 NOTE — Assessment & Plan Note (Signed)
This is a very pleasant 75 year old female with increasing pain on the volar aspect of her right thumb, with visible and palpable triggering. She is really not tender over the IP or the MCP, only on the volar aspect. Today we performed a flexor pollicis longus tendon sheath injection, adding some home rehab exercises, return to see me in a month.

## 2020-11-02 ENCOUNTER — Other Ambulatory Visit: Payer: Self-pay

## 2020-11-02 ENCOUNTER — Ambulatory Visit (INDEPENDENT_AMBULATORY_CARE_PROVIDER_SITE_OTHER): Payer: Medicare HMO | Admitting: Sports Medicine

## 2020-11-02 DIAGNOSIS — M65311 Trigger thumb, right thumb: Secondary | ICD-10-CM

## 2020-11-02 DIAGNOSIS — Z96641 Presence of right artificial hip joint: Secondary | ICD-10-CM

## 2020-11-02 NOTE — Progress Notes (Signed)
    Procedures performed today:    None.  Independent interpretation of notes and tests performed by another provider:   None.  Brief History, Exam, Impression, and Recommendations:    Trigger thumb, right thumb This pleasant 75 year old female returns, we did a right flexor pollicis longus tendon sheath injection and she returns today with good resolution of pain and triggering, return as needed.  Status post total hip replacement, right Has had bilateral hip arthroplasty, she gets occasional trochanteric bursitis, we can inject this in the future if needed.    ___________________________________________ Ihor Austin. Benjamin Stain, M.D., ABFM., CAQSM. Primary Care and Sports Medicine [facility]  Adjunct Instructor of Family Medicine  [employer]

## 2020-11-02 NOTE — Assessment & Plan Note (Signed)
This pleasant 75 year old female returns, we did a right flexor pollicis longus tendon sheath injection and she returns today with good resolution of pain and triggering, return as needed.

## 2020-11-02 NOTE — Assessment & Plan Note (Signed)
Has had bilateral hip arthroplasty, she gets occasional trochanteric bursitis, we can inject this in the future if needed.

## 2020-12-06 IMAGING — DX DG PORTABLE PELVIS
1 series · 1 of 1 positions shown · non-contrast
Comparison: Right hip x-rays from same day.

CLINICAL DATA: Right hip replacement.

EXAM:
PORTABLE PELVIS 1-2 VIEWS

[pelvis ap]
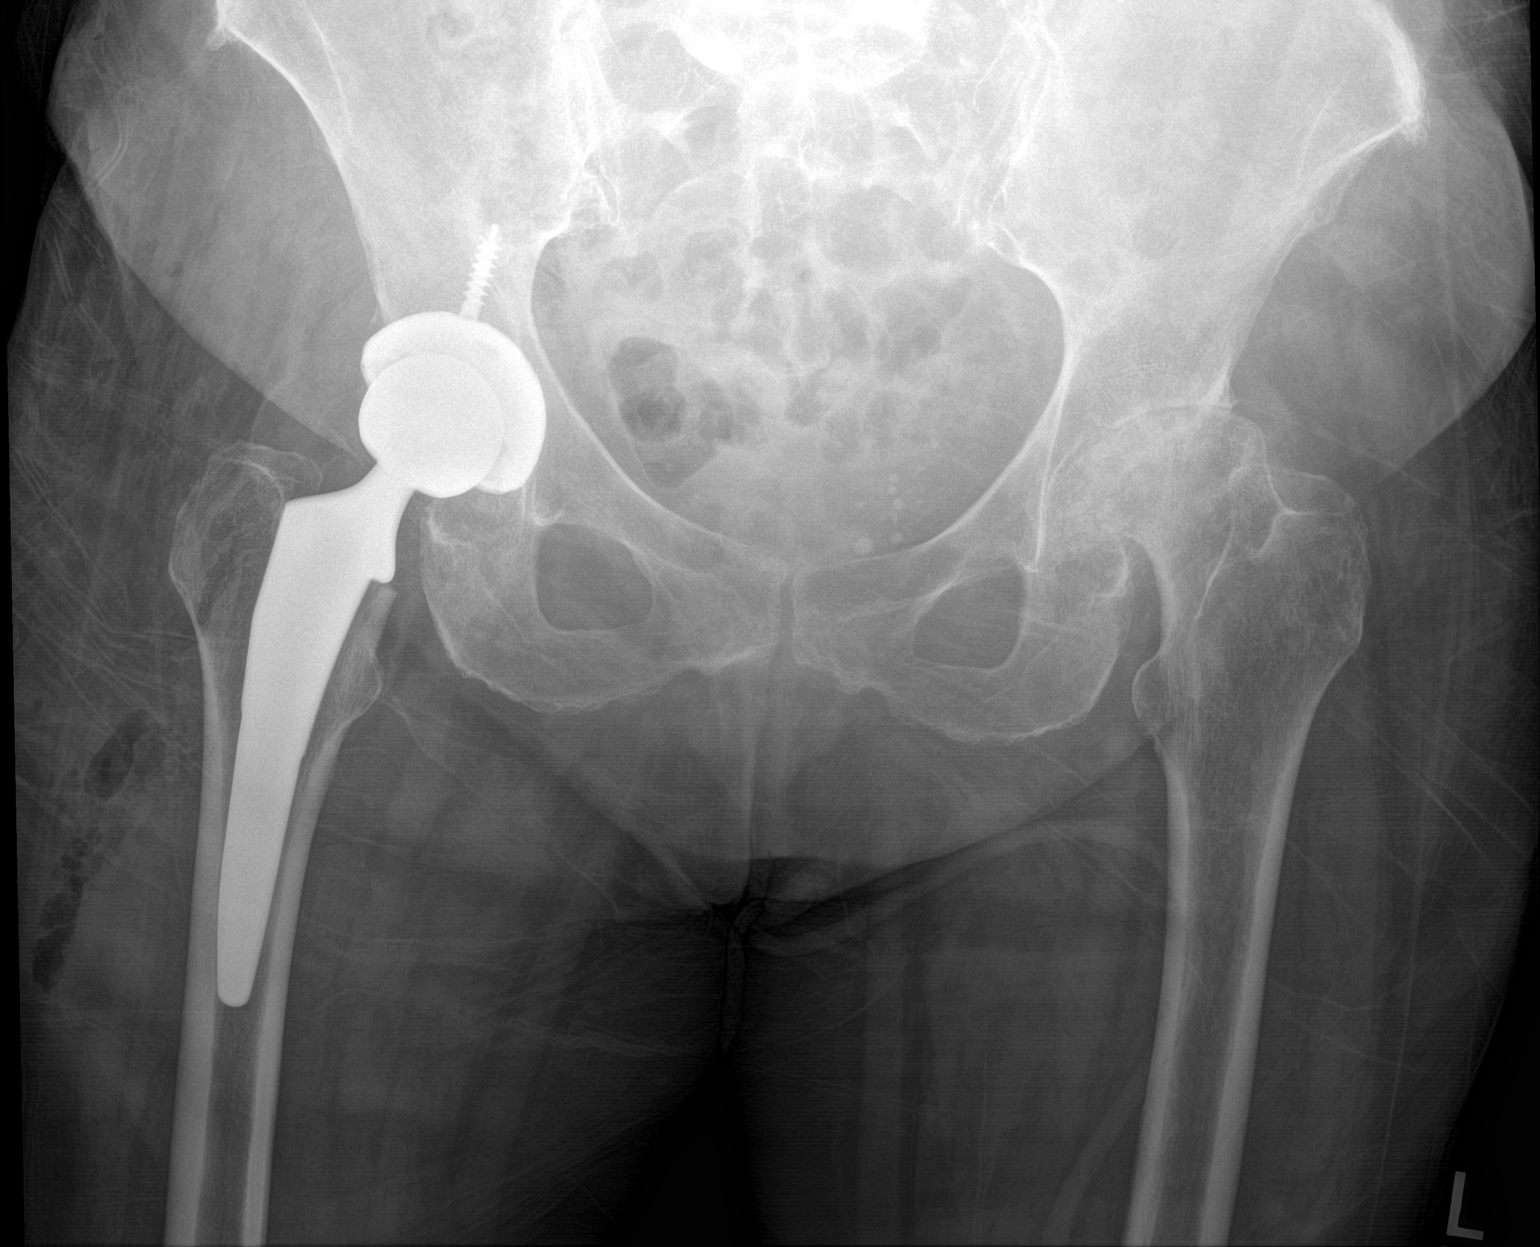

[1 of 1 positions shown; findings below may reference images not displayed]

FINDINGS: The right hip demonstrates a total arthroplasty without evidence of
hardware failure or complication. There is expected intra-articular
air. There is no fracture or dislocation. The alignment is anatomic.
Post-surgical changes noted in the surrounding soft tissues.
End-stage left hip osteoarthritis with femoral head flattening.
IMPRESSION: 1. Right total hip arthroplasty without acute postoperative
complication.
2. End-stage left hip osteoarthritis.

## 2020-12-06 IMAGING — RF DG HIP (WITH PELVIS) OPERATIVE*R*
1 series · 2 of 2 positions shown · non-contrast
Comparison: None.

CLINICAL DATA: 73-year-old female with right hip arthroplasty.

EXAM:
OPERATIVE right HIP (WITH PELVIS IF PERFORMED) 1 VIEW
TECHNIQUE: Fluoroscopic spot image(s) were submitted for interpretation
post-operatively.

[Series 1: unknown protocol · 0.20mm/px · 2 of 2 slices shown]
[im 1/2]
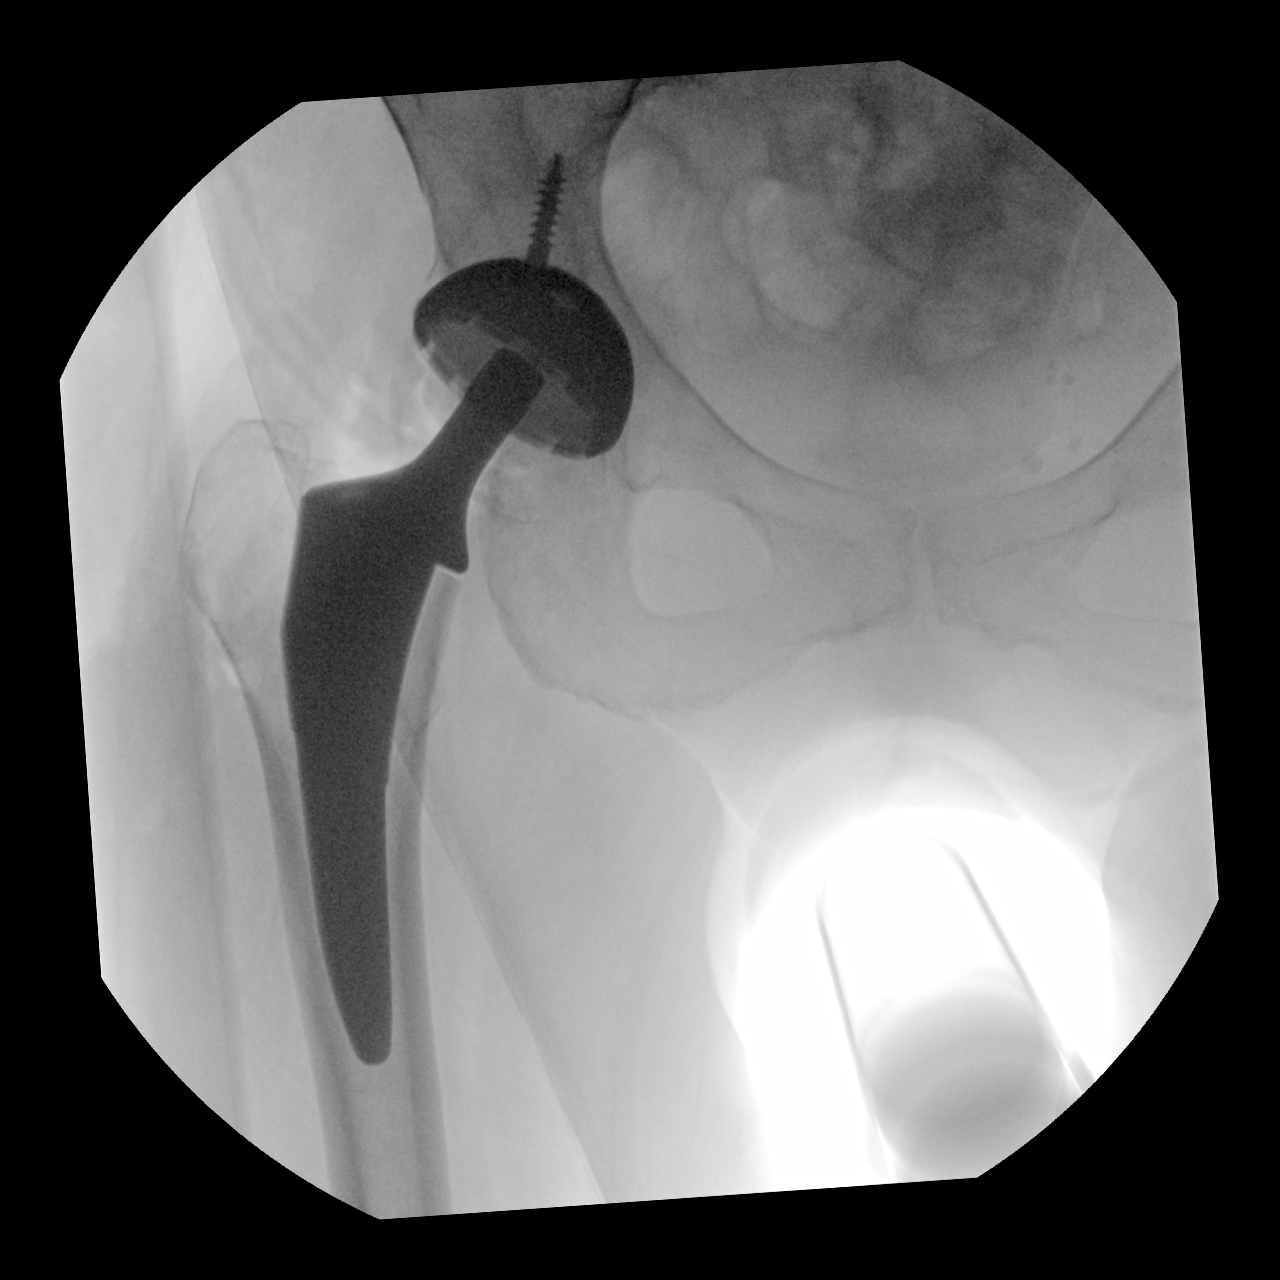
[im 2/2]
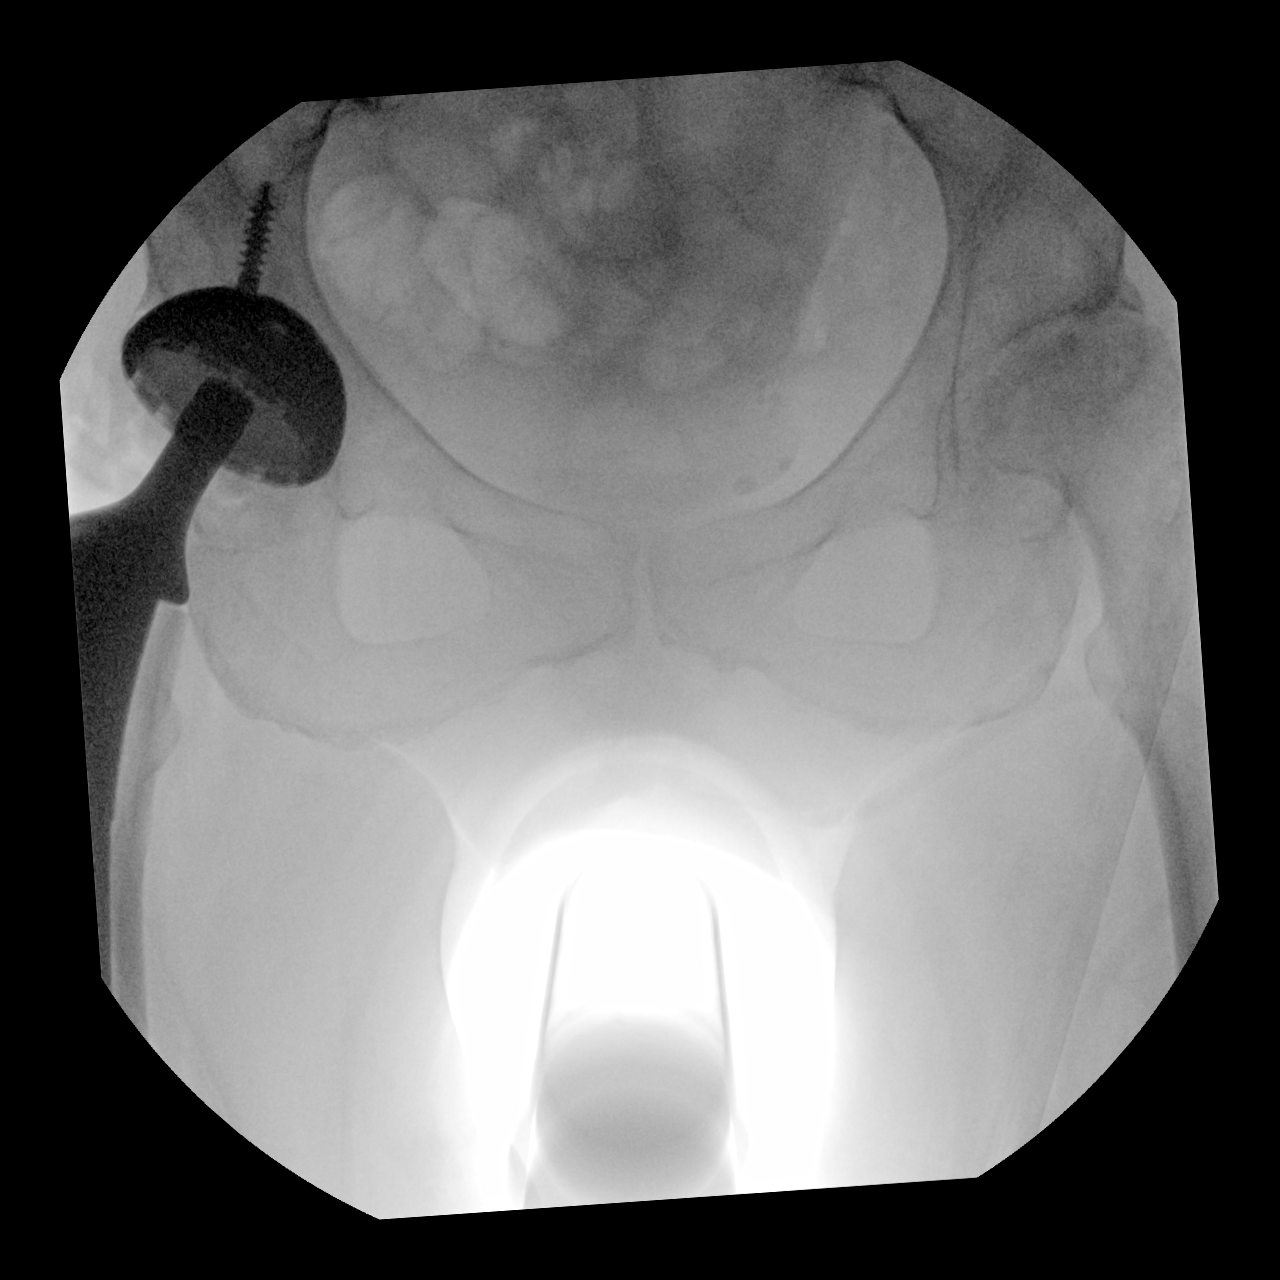

[2 of 2 positions shown; findings below may reference images not displayed]

FINDINGS: Two intraoperative fluoroscopic spot images of the right hip in AP
view provided. There is a right hip arthroplasty.
IMPRESSION: Right hip arthroplasty.

## 2020-12-13 DIAGNOSIS — H524 Presbyopia: Secondary | ICD-10-CM | POA: Diagnosis not present

## 2020-12-13 DIAGNOSIS — Z0100 Encounter for examination of eyes and vision without abnormal findings: Secondary | ICD-10-CM | POA: Diagnosis not present

## 2020-12-13 DIAGNOSIS — H5203 Hypermetropia, bilateral: Secondary | ICD-10-CM | POA: Diagnosis not present

## 2020-12-13 DIAGNOSIS — H269 Unspecified cataract: Secondary | ICD-10-CM | POA: Diagnosis not present

## 2020-12-13 DIAGNOSIS — H52209 Unspecified astigmatism, unspecified eye: Secondary | ICD-10-CM | POA: Diagnosis not present

## 2020-12-24 ENCOUNTER — Telehealth: Payer: Self-pay

## 2020-12-24 NOTE — Telephone Encounter (Signed)
Please order labs for patient's upcoming visit.  Thx

## 2020-12-24 NOTE — Telephone Encounter (Signed)
Please order labs for patient's upcoming visit.  Thx 

## 2020-12-25 ENCOUNTER — Other Ambulatory Visit: Payer: Self-pay | Admitting: Family Medicine

## 2020-12-25 DIAGNOSIS — E039 Hypothyroidism, unspecified: Secondary | ICD-10-CM

## 2020-12-25 DIAGNOSIS — E785 Hyperlipidemia, unspecified: Secondary | ICD-10-CM

## 2020-12-25 DIAGNOSIS — D68318 Other hemorrhagic disorder due to intrinsic circulating anticoagulants, antibodies, or inhibitors: Secondary | ICD-10-CM

## 2020-12-25 DIAGNOSIS — E559 Vitamin D deficiency, unspecified: Secondary | ICD-10-CM

## 2020-12-25 NOTE — Telephone Encounter (Signed)
Lab orders entered

## 2020-12-25 NOTE — Telephone Encounter (Signed)
Patient has been advised of lab orders.  

## 2020-12-27 ENCOUNTER — Ambulatory Visit: Payer: Medicare HMO | Admitting: Family Medicine

## 2020-12-31 DIAGNOSIS — E039 Hypothyroidism, unspecified: Secondary | ICD-10-CM | POA: Diagnosis not present

## 2020-12-31 DIAGNOSIS — E559 Vitamin D deficiency, unspecified: Secondary | ICD-10-CM | POA: Diagnosis not present

## 2020-12-31 DIAGNOSIS — E785 Hyperlipidemia, unspecified: Secondary | ICD-10-CM | POA: Diagnosis not present

## 2020-12-31 DIAGNOSIS — D68318 Other hemorrhagic disorder due to intrinsic circulating anticoagulants, antibodies, or inhibitors: Secondary | ICD-10-CM | POA: Diagnosis not present

## 2020-12-31 LAB — LIPID PANEL W/REFLEX DIRECT LDL
Cholesterol: 225 mg/dL — ABNORMAL HIGH (ref <200)
HDL: 74 mg/dL (ref >=50)
LDL Cholesterol (Calc): 134 mg/dL (calc) — ABNORMAL HIGH
Non-HDL Cholesterol (Calc): 151 mg/dL (calc) — ABNORMAL HIGH (ref <130)
Total CHOL/HDL Ratio: 3 (calc) (ref <5.0)
Triglycerides: 77 mg/dL (ref <150)

## 2020-12-31 LAB — COMPLETE METABOLIC PANEL WITH GFR
AG Ratio: 1.5 (calc) (ref 1.0–2.5)
ALT: 11 U/L (ref 6–29)
AST: 19 U/L (ref 10–35)
Albumin: 4.1 g/dL (ref 3.6–5.1)
Alkaline phosphatase (APISO): 64 U/L (ref 37–153)
BUN: 16 mg/dL (ref 7–25)
CO2: 30 mmol/L (ref 20–32)
Calcium: 9.9 mg/dL (ref 8.6–10.4)
Chloride: 101 mmol/L (ref 98–110)
Creat: 0.82 mg/dL (ref 0.60–0.93)
GFR, Est African American: 83 mL/min/{1.73_m2} (ref >=60)
GFR, Est Non African American: 71 mL/min/{1.73_m2} (ref >=60)
Globulin: 2.7 g/dL (calc) (ref 1.9–3.7)
Glucose, Bld: 91 mg/dL (ref 65–99)
Potassium: 3.9 mmol/L (ref 3.5–5.3)
Sodium: 139 mmol/L (ref 135–146)
Total Bilirubin: 0.8 mg/dL (ref 0.2–1.2)
Total Protein: 6.8 g/dL (ref 6.1–8.1)

## 2020-12-31 LAB — CBC WITH DIFFERENTIAL/PLATELET
Absolute Monocytes: 557 cells/uL (ref 200–950)
Basophils Absolute: 42 cells/uL (ref 0–200)
Basophils Relative: 0.8 %
Eosinophils Absolute: 170 cells/uL (ref 15–500)
Eosinophils Relative: 3.2 %
HCT: 39.9 % (ref 35.0–45.0)
Hemoglobin: 13.6 g/dL (ref 11.7–15.5)
Lymphs Abs: 1373 cells/uL (ref 850–3900)
MCH: 32.9 pg (ref 27.0–33.0)
MCHC: 34.1 g/dL (ref 32.0–36.0)
MCV: 96.6 fL (ref 80.0–100.0)
MPV: 9.6 fL (ref 7.5–12.5)
Monocytes Relative: 10.5 %
Neutro Abs: 3159 cells/uL (ref 1500–7800)
Neutrophils Relative %: 59.6 %
Platelets: 194 10*3/uL (ref 140–400)
RBC: 4.13 10*6/uL (ref 3.80–5.10)
RDW: 12.8 % (ref 11.0–15.0)
Total Lymphocyte: 25.9 %
WBC: 5.3 10*3/uL (ref 3.8–10.8)

## 2020-12-31 LAB — TSH: TSH: 5.35 mIU/L — ABNORMAL HIGH (ref 0.40–4.50)

## 2020-12-31 LAB — VITAMIN D 25 HYDROXY (VIT D DEFICIENCY, FRACTURES): Vit D, 25-Hydroxy: 28 ng/mL — ABNORMAL LOW (ref 30–100)

## 2021-01-03 ENCOUNTER — Ambulatory Visit (INDEPENDENT_AMBULATORY_CARE_PROVIDER_SITE_OTHER): Payer: Medicare HMO | Admitting: Family Medicine

## 2021-01-03 ENCOUNTER — Encounter: Payer: Self-pay | Admitting: Family Medicine

## 2021-01-03 ENCOUNTER — Other Ambulatory Visit: Payer: Self-pay

## 2021-01-03 DIAGNOSIS — J449 Chronic obstructive pulmonary disease, unspecified: Secondary | ICD-10-CM

## 2021-01-03 DIAGNOSIS — I10 Essential (primary) hypertension: Secondary | ICD-10-CM

## 2021-01-03 DIAGNOSIS — Z9109 Other allergy status, other than to drugs and biological substances: Secondary | ICD-10-CM

## 2021-01-03 DIAGNOSIS — E039 Hypothyroidism, unspecified: Secondary | ICD-10-CM | POA: Diagnosis not present

## 2021-01-03 MED ORDER — HYDROCHLOROTHIAZIDE 25 MG PO TABS: 25.0000 mg | ORAL_TABLET | Freq: Every day | ORAL | 3 refills | Status: DC

## 2021-01-03 MED ORDER — LISINOPRIL-HYDROCHLOROTHIAZIDE 10-12.5 MG PO TABS: 1.0000 | ORAL_TABLET | Freq: Every day | ORAL | 3 refills | Status: DC

## 2021-01-03 MED ORDER — MONTELUKAST SODIUM 10 MG PO TABS: 10.0000 mg | ORAL_TABLET | Freq: Every day | ORAL | 3 refills | Status: DC

## 2021-01-03 NOTE — Progress Notes (Signed)
Jennifer [name] - 75 y.o. female MRN [id number]  Date of birth: [birth year]  Subjective Chief Complaint  Patient presents with  . Follow-up    HPI Jennifer [name] is a 75 y.o. female here today for follow up visit.    HTN is currently managed with lisinopril/hctz 10/12.5mg .  She states that she is doing well with this.  BP has been well controlled with this at home.  Previously had additional HCTZ 25mg  that she took as needed for fluid retention.  Would like to add this back on. She denies chest pain, shortness of breath, palpitations, headache or vision changes.   Taking singulair and using symbicort daily.  Has needed albuterol a little more during current allergy season.  Needs refill of singulair.  ROS:  A comprehensive ROS was completed and negative except as noted per HPI  Allergies  Allergen Reactions  . Prednisone Other (See Comments)    psychological reaction, hallucinations, Paranoia  . Grass Pollen(K-O-R-T-Swt Vern) Other (See Comments)  . Statins Other (See Comments)    Other reaction(s): muscle/joint pain Weakness and tiredness    Past Medical History:  Diagnosis Date  . Anxiety   . Arthritis   . Asthma   . COPD (chronic obstructive pulmonary disease) (HCC) 11/03/2018  . Dyspnea    aT TIMES WITH ASTHMA ATTACKS  . Familial hypercholanemia 11/03/2018  . HLD (hyperlipidemia) 11/03/2018  . HTN (hypertension) 11/03/2018  . Hypothyroid 11/03/2018  . Pneumonia   . Vitamin D deficiency 11/03/2018  . Von Willebrand disease (HCC) possible 11/03/2018   Seen in documentation from prior provider    Past Surgical History:  Procedure Laterality Date  . APPENDECTOMY  1978  . BIOPSY ENDOMETRIAL    . CESAREAN SECTION     triplets  . TOTAL HIP ARTHROPLASTY Right 08/02/2019   Procedure: TOTAL  RIGHT  HIP ARTHROPLASTY ANTERIOR APPROACH;  Surgeon: 08/04/2019, MD;  Location: [facility];  Service: Orthopedics;  Laterality: Right;  70 mins  . TOTAL HIP ARTHROPLASTY Left 09/06/2019    Procedure: TOTAL HIP ARTHROPLASTY ANTERIOR APPROACH;  Surgeon: 09/08/2019, MD;  Location: [facility];  Service: Orthopedics;  Laterality: Left;  70 mins  . TUBAL LIGATION      Social History   Socioeconomic History  . Marital status: Single    Spouse name: Not on file  . Number of children: Not on file  . Years of education: Not on file  . Highest education level: Not on file  Occupational History  . Not on file  Tobacco Use  . Smoking status: Former Smoker    Packs/day: 0.50    Years: 15.00    Pack years: 7.50    Types: Cigarettes    Quit date: 07/22/2014    Years since quitting: 6.4  . Smokeless tobacco: Never Used  Vaping Use  . Vaping Use: Never used  Substance and Sexual Activity  . Alcohol use: Not Currently  . Drug use: Never  . Sexual activity: Not Currently    Birth control/protection: Abstinence  Other Topics Concern  . Not on file  Social History Narrative  . Not on file   Social Determinants of Health   Financial Resource Strain: Not on file  Food Insecurity: Not on file  Transportation Needs: Not on file  Physical Activity: Not on file  Stress: Not on file  Social Connections: Not on file    Family History  Problem Relation Age of Onset  . Parkinson's disease Mother   . Cancer Father  prostate  . Asthma Brother     Health Maintenance  Topic Date Due  . COLONOSCOPY (Pts 45-23yrs Insurance coverage will need to be confirmed)  Never done  . MAMMOGRAM  Never done  . DEXA SCAN  Never done  . PNA vac Low Risk Adult (1 of 2 - PCV13) Never done  . COVID-19 Vaccine (3 - Booster for Pfizer series) 06/22/2020  . TETANUS/TDAP  03/04/2027  . Hepatitis C Screening  Completed  . HPV VACCINES  Aged Out  . INFLUENZA VACCINE  Discontinued      ----------------------------------------------------------------------------------------------------------------------------------------------------------------------------------------------------------------- Physical Exam BP (!) 142/95 (BP Location: Left Arm, Patient Position: Sitting, Cuff Size: Normal)   Pulse (!) 119   Ht 5\' 6"  (1.676 m)   Wt 169 lb (76.7 kg)   SpO2 93%   BMI 27.28 kg/m   Physical Exam Constitutional:      Appearance: Normal appearance.  Eyes:     General: No scleral icterus. Cardiovascular:     Rate and Rhythm: Normal rate and regular rhythm.  Pulmonary:     Effort: Pulmonary effort is normal.     Breath sounds: Normal breath sounds.  Musculoskeletal:     Cervical back: Neck supple.  Neurological:     General: No focal deficit present.     Mental Status: She is alert.  Psychiatric:        Mood and Affect: Mood normal.        Behavior: Behavior normal.     ------------------------------------------------------------------------------------------------------------------------------------------------------------------------------------------------------------------- Assessment and Plan  Hypothyroid Mild elevation in TSH.  She doesn't feel any different when taking thyroid medication and TSH levels fluctuate.  She doesn't want to re-start this at this time.    COPD (chronic obstructive pulmonary disease) (HCC) Continue symbicort daily.  Has allergy component as well.  Singulair renewed.    Hypertension goal BP (blood pressure) < 140/90 BP mildly elevated with some mild edema.  Will add additional 25mg  HCTZ on.  Continue lisinopril/hctz at 10/12.5mg .     Declines mammogram, colon cancer screening and DEXA.  She is aware of risks of not having screenings completed.    Meds ordered this encounter  Medications  . lisinopril-hydrochlorothiazide (ZESTORETIC) 10-12.5 MG tablet    Sig: Take 1 tablet by mouth daily.    Dispense:  90 tablet     Refill:  3  . montelukast (SINGULAIR) 10 MG tablet    Sig: Take 1 tablet (10 mg total) by mouth at bedtime.    Dispense:  90 tablet    Refill:  3  . hydrochlorothiazide (HYDRODIURIL) 25 MG tablet    Sig: Take 1 tablet (25 mg total) by mouth daily.    Dispense:  90 tablet    Refill:  3    Return in about 6 months (around 07/05/2021) for HTN.    This visit occurred during the SARS-CoV-2 public health emergency.  Safety protocols were in place, including screening questions prior to the visit, additional usage of staff PPE, and extensive cleaning of exam room while observing appropriate contact time as indicated for disinfecting solutions.

## 2021-01-03 NOTE — Assessment & Plan Note (Signed)
Mild elevation in TSH.  She doesn't feel any different when taking thyroid medication and TSH levels fluctuate.  She doesn't want to re-start this at this time.

## 2021-01-03 NOTE — Assessment & Plan Note (Signed)
BP mildly elevated with some mild edema.  Will add additional 25mg  HCTZ on.  Continue lisinopril/hctz at 10/12.5mg .

## 2021-01-03 NOTE — Assessment & Plan Note (Signed)
Continue symbicort daily.  Has allergy component as well.  Singulair renewed.

## 2021-01-03 NOTE — Patient Instructions (Signed)
Great to see you! Add 25mg  hctz as needed for fluid retention.  See me again in 6 months.

## 2021-01-04 ENCOUNTER — Other Ambulatory Visit: Payer: Self-pay

## 2021-01-04 DIAGNOSIS — J432 Centrilobular emphysema: Secondary | ICD-10-CM

## 2021-01-04 MED ORDER — ALBUTEROL SULFATE HFA 108 (90 BASE) MCG/ACT IN AERS: 1.0000 | INHALATION_SPRAY | RESPIRATORY_TRACT | 6 refills | Status: DC | PRN

## 2021-01-10 IMAGING — RF DG HIP (WITH PELVIS) OPERATIVE*L*
1 series · 4 of 4 positions shown · non-contrast
Comparison: 08/02/2019

CLINICAL DATA: Left hip replacement

EXAM:
OPERATIVE LEFT HIP (WITH PELVIS IF PERFORMED) 4  VIEWS
TECHNIQUE: Fluoroscopic spot image(s) were submitted for interpretation
post-operatively.

[Series 1: unknown protocol · 0.20mm/px · 4 of 4 slices shown]
[im 1/4]
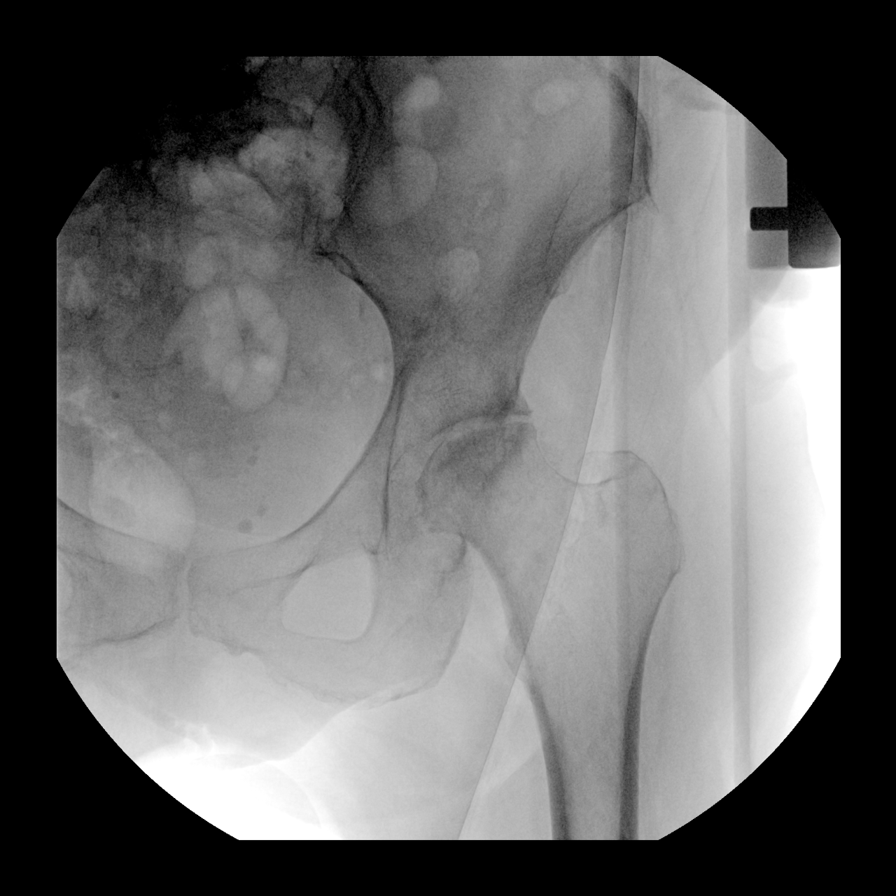
[im 2/4]
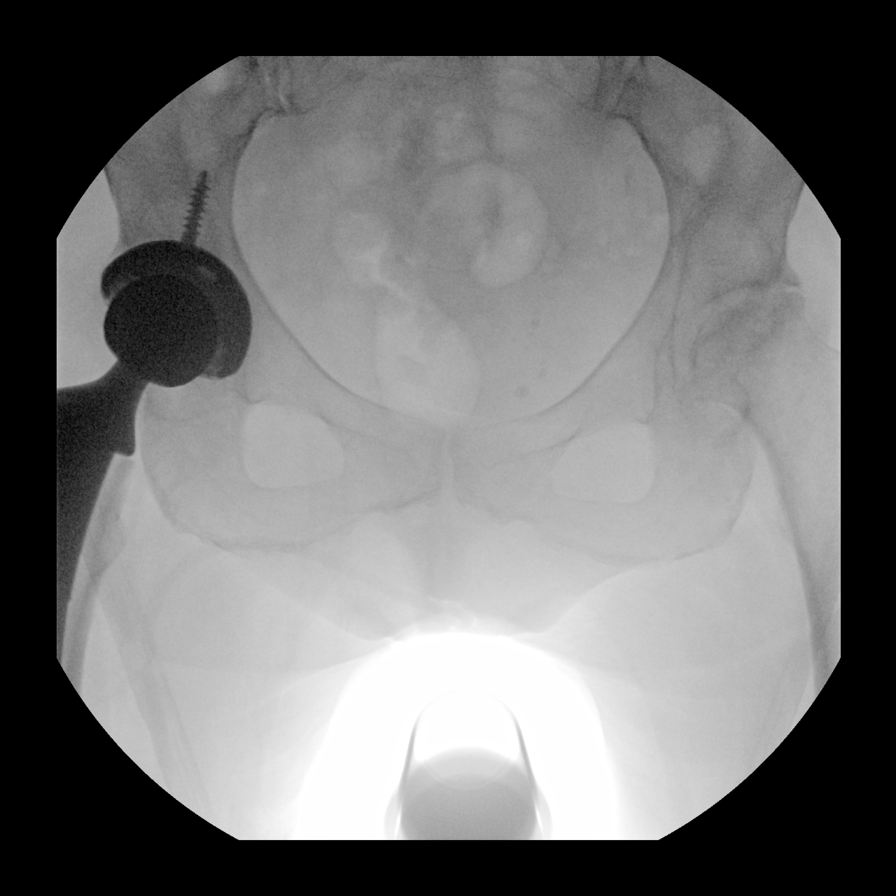
[im 3/4]
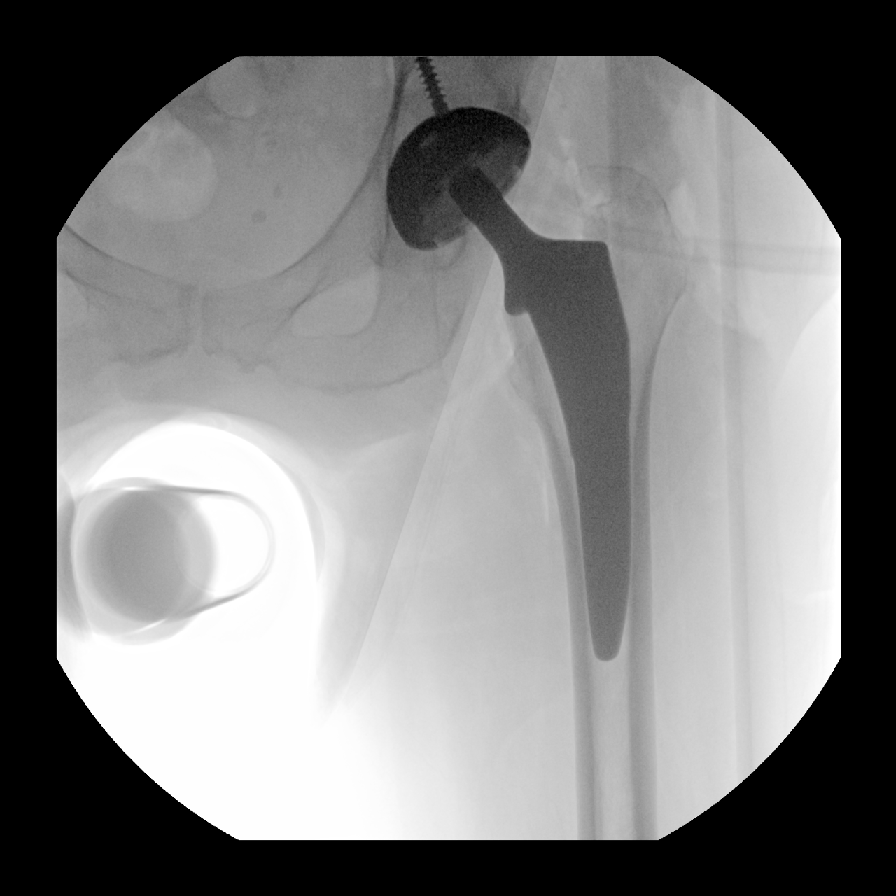
[im 4/4]
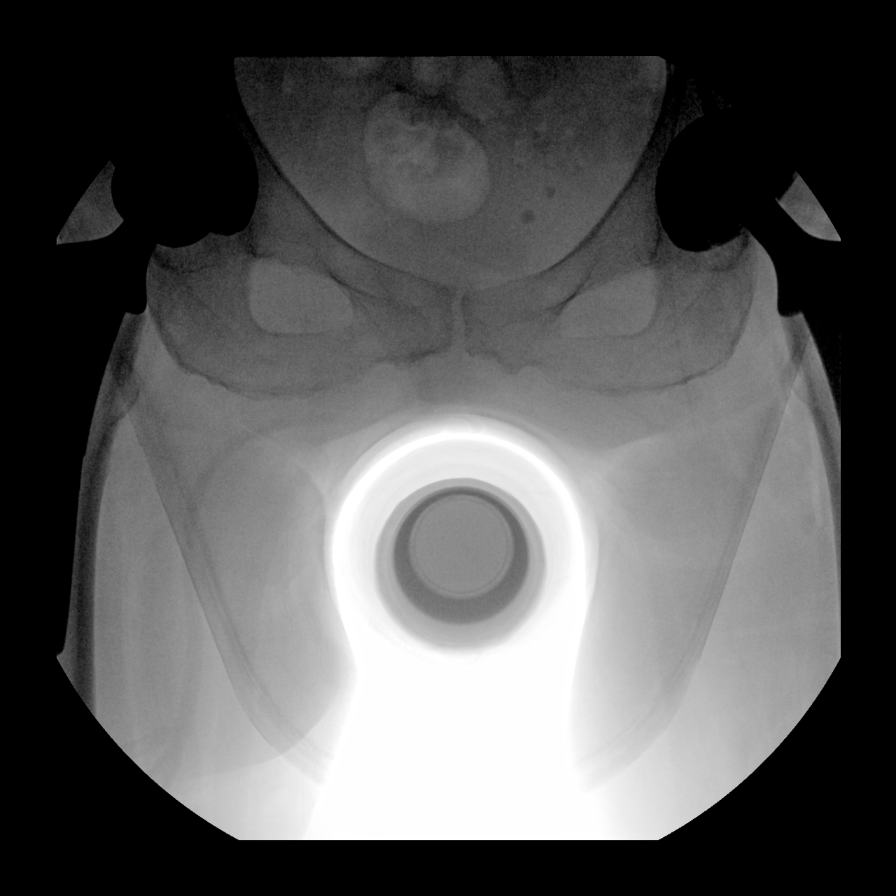

[4 of 4 positions shown; findings below may reference images not displayed]

FINDINGS: intraoperative spot images demonstrate changes of left hip
replacement. No hardware bony complicating feature. Normal AP
alignment. Remote right hip replacement changes.
IMPRESSION: Interval left hip replacement.  No visible complicating feature.

## 2021-01-10 IMAGING — DX DG PORTABLE PELVIS
1 series · 1 of 1 positions shown · non-contrast
Comparison: 08/02/2019

CLINICAL DATA: Left hip replacement

EXAM:
PORTABLE PELVIS 1-2 VIEWS

[pelvis ap]
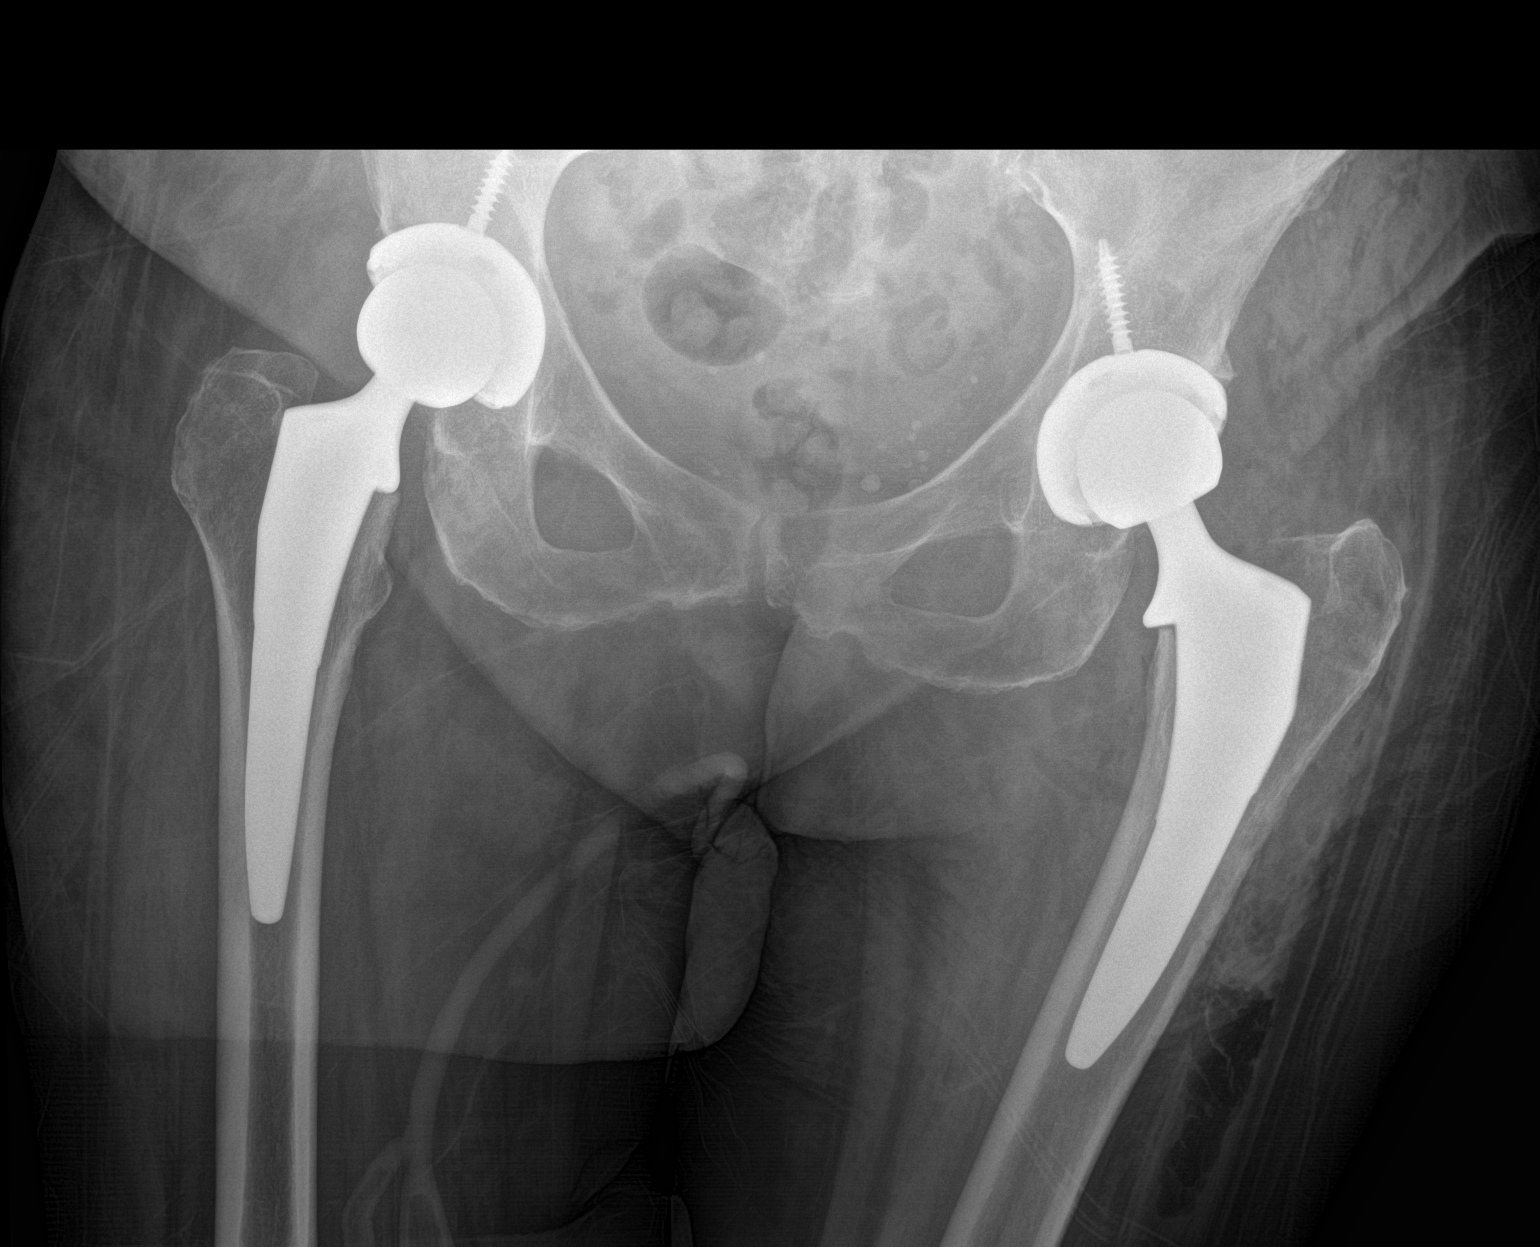

[1 of 1 positions shown; findings below may reference images not displayed]

FINDINGS: Changes of new left hip replacement. Normal AP alignment. No
hardware bony complicating feature. Remote right hip replacement
changes.
IMPRESSION: New left hip replacement and remote right hip replacement. No
hardware complicating feature.

## 2021-01-25 ENCOUNTER — Telehealth: Payer: Self-pay | Admitting: Family Medicine

## 2021-01-25 NOTE — Progress Notes (Signed)
  Chronic Care Management   Note  01/25/2021 Name: Jennifer [name] MRN: [id number] DOB: [birth year]  Jennifer [name] is a 75 y.o. year old female who is a primary care patient of Everrett Coombe, DO. I reached out to [employer] by phone today in response to a referral sent by Jennifer [name]'s PCP, Everrett Coombe, DO.   Jennifer [name] was given information about Chronic Care Management services today including:  1. CCM service includes personalized support from designated clinical staff supervised by her physician, including individualized plan of care and coordination with other care providers 2. 24/7 contact phone numbers for assistance for urgent and routine care needs. 3. Service will only be billed when office clinical staff spend 20 minutes or more in a month to coordinate care. 4. Only one practitioner may furnish and bill the service in a calendar month. 5. The patient may stop CCM services at any time (effective at the end of the month) by phone call to the office staff.   Patient did not agree to enrollment in care management services and does not wish to consider at this time.  Follow up plan:   Carmell Austria [employer] Scheduler

## 2021-06-11 ENCOUNTER — Ambulatory Visit (INDEPENDENT_AMBULATORY_CARE_PROVIDER_SITE_OTHER): Payer: Medicare HMO | Admitting: Sports Medicine

## 2021-06-11 ENCOUNTER — Ambulatory Visit (INDEPENDENT_AMBULATORY_CARE_PROVIDER_SITE_OTHER): Payer: Medicare HMO

## 2021-06-11 ENCOUNTER — Telehealth: Payer: Self-pay | Admitting: Family Medicine

## 2021-06-11 DIAGNOSIS — M65311 Trigger thumb, right thumb: Secondary | ICD-10-CM

## 2021-06-11 DIAGNOSIS — E785 Hyperlipidemia, unspecified: Secondary | ICD-10-CM

## 2021-06-11 DIAGNOSIS — E559 Vitamin D deficiency, unspecified: Secondary | ICD-10-CM

## 2021-06-11 DIAGNOSIS — E039 Hypothyroidism, unspecified: Secondary | ICD-10-CM

## 2021-06-11 NOTE — Telephone Encounter (Signed)
Pt has scheduled an appointment on 10/27 and wants her labs drawn on 10/24 to check cholesterol, etc.

## 2021-06-11 NOTE — Progress Notes (Signed)
    Procedures performed today:    Procedure: Real-time Ultrasound Guided injection of the right flexor pollicis longus tendon sheath Device: Samsung HS60  Verbal informed consent obtained.  Time-out conducted.  Noted no overlying erythema, induration, or other signs of local infection.  Skin prepped in a sterile fashion.  Local anesthesia: Topical Ethyl chloride.  With sterile technique and under real time ultrasound guidance: Noted tendon nodule and sheath synovitis, 1/2 cc lidocaine, 1/2 cc kenalog 40 injected easily. Completed without difficulty  Advised to call if fevers/chills, erythema, induration, drainage, or persistent bleeding.  Images permanently stored and available for review in PACS.  Impression: Technically successful ultrasound guided injection.  Independent interpretation of notes and tests performed by another provider:   None.  Brief History, Exam, Impression, and Recommendations:    Trigger thumb, right thumb 9 months relief from previous flexor pollicis longus tendon sheath injection, recurrence of triggering, repeat flexor pollicis longus tendon sheath injection today, return as needed.    ___________________________________________ Ihor Austin. Benjamin Stain, M.D., ABFM., CAQSM. Primary Care and Sports Medicine [facility]  Adjunct Instructor of Family Medicine  [employer]

## 2021-06-11 NOTE — Assessment & Plan Note (Signed)
9 months relief from previous flexor pollicis longus tendon sheath injection, recurrence of triggering, repeat flexor pollicis longus tendon sheath injection today, return as needed.

## 2021-07-04 ENCOUNTER — Ambulatory Visit: Payer: Medicare HMO | Admitting: Family Medicine

## 2021-07-24 ENCOUNTER — Other Ambulatory Visit: Payer: Self-pay | Admitting: Family Medicine

## 2021-07-24 DIAGNOSIS — J432 Centrilobular emphysema: Secondary | ICD-10-CM

## 2021-08-04 DIAGNOSIS — S0990XA Unspecified injury of head, initial encounter: Secondary | ICD-10-CM | POA: Diagnosis not present

## 2021-08-04 DIAGNOSIS — S0101XA Laceration without foreign body of scalp, initial encounter: Secondary | ICD-10-CM | POA: Diagnosis not present

## 2021-08-04 DIAGNOSIS — S51811A Laceration without foreign body of right forearm, initial encounter: Secondary | ICD-10-CM | POA: Diagnosis not present

## 2021-08-14 NOTE — Progress Notes (Signed)
Acute Office Visit  Subjective:    Patient ID: Jennifer [name], female    DOB: 04-01-1946, 75 y.o.   MRN: [id number]  Chief Complaint  Patient presents with   Suture / Staple Removal    (3) top of scalp    HPI Patient is in today for staple removal.   08/04/21 - ED visit after falling and hitting her head. Reports history of von Willebrand and was afraid of bleeding out when she noted bleeding from her head and a forearm injury. She denied loss of consciousness. CT head was negative. Right forearm skin tear repaired with steri strips. Head laceration was repaired with staples (patient reports 3 were placed.   She is here for staple removal. Wound appears to be healing well. Edges are approximated with some slight scabbing. No drainage, erythema, edema.       Past Medical History:  Diagnosis Date   Anxiety    Arthritis    Asthma    COPD (chronic obstructive pulmonary disease) (HCC) 11/03/2018   Dyspnea    aT TIMES WITH ASTHMA ATTACKS   Familial hypercholanemia 11/03/2018   HLD (hyperlipidemia) 11/03/2018   HTN (hypertension) 11/03/2018   Hypothyroid 11/03/2018   Pneumonia    Vitamin D deficiency 11/03/2018   Von Willebrand disease (HCC) possible 11/03/2018   Seen in documentation from prior provider    Past Surgical History:  Procedure Laterality Date   APPENDECTOMY  1978   BIOPSY ENDOMETRIAL     CESAREAN SECTION     triplets   TOTAL HIP ARTHROPLASTY Right 08/02/2019   Procedure: TOTAL  RIGHT  HIP ARTHROPLASTY ANTERIOR APPROACH;  Surgeon: Durene Romans, MD;  Location: [facility];  Service: Orthopedics;  Laterality: Right;  70 mins   TOTAL HIP ARTHROPLASTY Left 09/06/2019   Procedure: TOTAL HIP ARTHROPLASTY ANTERIOR APPROACH;  Surgeon: Durene Romans, MD;  Location: [facility];  Service: Orthopedics;  Laterality: Left;  70 mins   TUBAL LIGATION      Family History  Problem Relation Age of Onset   Parkinson's disease Mother    Cancer Father        prostate   Asthma Brother      Social History   Socioeconomic History   Marital status: Single    Spouse name: Not on file   Number of children: Not on file   Years of education: Not on file   Highest education level: Not on file  Occupational History   Not on file  Tobacco Use   Smoking status: Former    Packs/day: 0.50    Years: 15.00    Pack years: 7.50    Types: Cigarettes    Quit date: 07/22/2014    Years since quitting: 7.0   Smokeless tobacco: Never  Vaping Use   Vaping Use: Never used  Substance and Sexual Activity   Alcohol use: Not Currently   Drug use: Never   Sexual activity: Not Currently    Birth control/protection: Abstinence  Other Topics Concern   Not on file  Social History Narrative   Not on file   Social Determinants of Health   Financial Resource Strain: Not on file  Food Insecurity: Not on file  Transportation Needs: Not on file  Physical Activity: Not on file  Stress: Not on file  Social Connections: Not on file  Intimate Partner Violence: Not on file    Outpatient Medications Prior to Visit  Medication Sig Dispense Refill   albuterol (VENTOLIN HFA) 108 (90 Base) MCG/ACT  inhaler Inhale 1-2 puffs into the lungs every 4 (four) hours as needed for wheezing or shortness of breath. 16 g 6   chlorpheniramine (CHLOR-TRIMETON) 4 MG tablet Take 4 mg by mouth daily.     hydrochlorothiazide (HYDRODIURIL) 25 MG tablet Take 1 tablet (25 mg total) by mouth daily. 90 tablet 3   lisinopril-hydrochlorothiazide (ZESTORETIC) 10-12.5 MG tablet Take 1 tablet by mouth daily. 90 tablet 3   montelukast (SINGULAIR) 10 MG tablet Take 1 tablet (10 mg total) by mouth at bedtime. 90 tablet 3   polyethylene glycol (MIRALAX / GLYCOLAX) 17 g packet Take 17 g by mouth 2 (two) times daily. 28 packet 0   SYMBICORT 160-4.5 MCG/ACT inhaler TAKE 2 PUFFS BY MOUTH TWICE A DAY 10.2 each 9   No facility-administered medications prior to visit.    Allergies  Allergen Reactions   Prednisone Other (See  Comments)    psychological reaction, hallucinations, Paranoia   Grass Pollen(K-O-R-T-Swt Vern) Other (See Comments)   Statins Other (See Comments)    Other reaction(s): muscle/joint pain Weakness and tiredness    Review of Systems All review of systems negative except what is listed in the HPI     Objective:    Physical Exam Vitals reviewed.  Constitutional:      Appearance: Normal appearance. She is normal weight.  HENT:     Head:     Comments: 3 staples to scalp, no signs of infection, wound is closed appropriately Skin:    Capillary Refill: Capillary refill takes less than 2 seconds.  Neurological:     Mental Status: She is alert.  Psychiatric:        Mood and Affect: Mood normal.        Behavior: Behavior normal.        Thought Content: Thought content normal.        Judgment: Judgment normal.    There were no vitals taken for this visit. Wt Readings from Last 3 Encounters:  01/03/21 169 lb (76.7 kg)  06/28/20 163 lb 9.6 oz (74.2 kg)  12/26/19 164 lb (74.4 kg)    Health Maintenance Due  Topic Date Due   COLONOSCOPY (Pts 45-59yrs Insurance coverage will need to be confirmed)  Never done   MAMMOGRAM  Never done   DEXA SCAN  Never done    There are no preventive care reminders to display for this patient.   Lab Results  Component Value Date   TSH 5.35 (H) 12/31/2020   Lab Results  Component Value Date   WBC 5.3 12/31/2020   HGB 13.6 12/31/2020   HCT 39.9 12/31/2020   MCV 96.6 12/31/2020   PLT 194 12/31/2020   Lab Results  Component Value Date   NA 139 12/31/2020   K 3.9 12/31/2020   CO2 30 12/31/2020   GLUCOSE 91 12/31/2020   BUN 16 12/31/2020   CREATININE 0.82 12/31/2020   BILITOT 0.8 12/31/2020   ALKPHOS 78 06/18/2018   AST 19 12/31/2020   ALT 11 12/31/2020   PROT 6.8 12/31/2020   CALCIUM 9.9 12/31/2020   ANIONGAP 8 09/07/2019   Lab Results  Component Value Date   CHOL 225 (H) 12/31/2020   Lab Results  Component Value Date   HDL 74  12/31/2020   Lab Results  Component Value Date   LDLCALC 134 (H) 12/31/2020   Lab Results  Component Value Date   TRIG 77 12/31/2020   Lab Results  Component Value Date   CHOLHDL 3.0 12/31/2020  No results found for: HGBA1C     Assessment & Plan:   1. Encounter for staple removal Scalp inspected. No signs of infection. All 3 staples removed easily per protocol. Topical antibiotic ointment applied. Patient aware of signs/symptoms requiring further/urgent evaluation.    Follow-up as needed.   Lollie Marrow Reola Calkins, DNP, FNP-C

## 2021-08-15 ENCOUNTER — Encounter: Payer: Self-pay | Admitting: Family Medicine

## 2021-08-15 ENCOUNTER — Ambulatory Visit (INDEPENDENT_AMBULATORY_CARE_PROVIDER_SITE_OTHER): Payer: Medicare HMO | Admitting: Family Medicine

## 2021-08-15 DIAGNOSIS — Z4802 Encounter for removal of sutures: Secondary | ICD-10-CM

## 2021-11-12 DIAGNOSIS — E559 Vitamin D deficiency, unspecified: Secondary | ICD-10-CM | POA: Diagnosis not present

## 2021-11-12 DIAGNOSIS — E039 Hypothyroidism, unspecified: Secondary | ICD-10-CM | POA: Diagnosis not present

## 2021-11-12 DIAGNOSIS — E785 Hyperlipidemia, unspecified: Secondary | ICD-10-CM | POA: Diagnosis not present

## 2021-11-13 LAB — LIPID PANEL W/REFLEX DIRECT LDL
Cholesterol: 228 mg/dL — ABNORMAL HIGH (ref <200)
HDL: 79 mg/dL (ref >=50)
LDL Cholesterol (Calc): 130 mg/dL (calc) — ABNORMAL HIGH
Non-HDL Cholesterol (Calc): 149 mg/dL (calc) — ABNORMAL HIGH (ref <130)
Total CHOL/HDL Ratio: 2.9 (calc) (ref <5.0)
Triglycerides: 93 mg/dL (ref <150)

## 2021-11-13 LAB — TSH: TSH: 4.74 mIU/L — ABNORMAL HIGH (ref 0.40–4.50)

## 2021-11-13 LAB — VITAMIN D 25 HYDROXY (VIT D DEFICIENCY, FRACTURES): Vit D, 25-Hydroxy: 19 ng/mL — ABNORMAL LOW (ref 30–100)

## 2021-11-15 ENCOUNTER — Other Ambulatory Visit: Payer: Self-pay | Admitting: Family Medicine

## 2021-11-15 MED ORDER — VITAMIN D (ERGOCALCIFEROL) 1.25 MG (50000 UNIT) PO CAPS: 50000.0000 [IU] | ORAL_CAPSULE | ORAL | 0 refills | Status: AC

## 2021-12-09 ENCOUNTER — Telehealth: Payer: Self-pay | Admitting: Family Medicine

## 2022-02-06 ENCOUNTER — Other Ambulatory Visit: Payer: Self-pay | Admitting: Family Medicine

## 2022-02-06 DIAGNOSIS — Z9109 Other allergy status, other than to drugs and biological substances: Secondary | ICD-10-CM

## 2022-02-06 NOTE — Telephone Encounter (Signed)
Please contact the patient to schedule an office visit for HTN follow-up. Not seen since 12/2020. Updated labs required as well. Sending 60 day HTN & COPD medication refill to hold until schedule opening. Thanks

## 2022-02-06 NOTE — Telephone Encounter (Signed)
LVM for patient to call back to get appt scheduled. AMUCK 

## 2022-03-23 ENCOUNTER — Other Ambulatory Visit: Payer: Self-pay | Admitting: Family Medicine

## 2022-03-23 DIAGNOSIS — J432 Centrilobular emphysema: Secondary | ICD-10-CM

## 2022-03-27 ENCOUNTER — Other Ambulatory Visit: Payer: Self-pay | Admitting: Family Medicine

## 2022-03-27 DIAGNOSIS — E785 Hyperlipidemia, unspecified: Secondary | ICD-10-CM

## 2022-03-27 DIAGNOSIS — E039 Hypothyroidism, unspecified: Secondary | ICD-10-CM

## 2022-03-27 DIAGNOSIS — E559 Vitamin D deficiency, unspecified: Secondary | ICD-10-CM

## 2022-03-27 DIAGNOSIS — I10 Essential (primary) hypertension: Secondary | ICD-10-CM

## 2022-03-27 NOTE — Telephone Encounter (Signed)
Pls contact pt to schedule appt for HTN. Sending 30 day medication refill. No addt'l refills.

## 2022-03-27 NOTE — Telephone Encounter (Signed)
Patient has been scheduled for 04/23/22 and wanted to come a few days prior to visit to have lab orders for a full panel checked, wants thyroid and everything checked. AMUCK

## 2022-03-28 NOTE — Telephone Encounter (Signed)
Patient notified. AMUCK

## 2022-03-28 NOTE — Telephone Encounter (Signed)
Orders entered

## 2022-04-08 ENCOUNTER — Other Ambulatory Visit: Payer: Self-pay | Admitting: Family Medicine

## 2022-04-08 DIAGNOSIS — Z9109 Other allergy status, other than to drugs and biological substances: Secondary | ICD-10-CM

## 2022-04-15 DIAGNOSIS — E559 Vitamin D deficiency, unspecified: Secondary | ICD-10-CM | POA: Diagnosis not present

## 2022-04-15 DIAGNOSIS — I10 Essential (primary) hypertension: Secondary | ICD-10-CM | POA: Diagnosis not present

## 2022-04-15 DIAGNOSIS — E785 Hyperlipidemia, unspecified: Secondary | ICD-10-CM | POA: Diagnosis not present

## 2022-04-15 DIAGNOSIS — E039 Hypothyroidism, unspecified: Secondary | ICD-10-CM | POA: Diagnosis not present

## 2022-04-16 LAB — CBC WITH DIFFERENTIAL/PLATELET
Absolute Monocytes: 587 cells/uL (ref 200–950)
Basophils Absolute: 59 cells/uL (ref 0–200)
Basophils Relative: 0.9 %
Eosinophils Absolute: 172 cells/uL (ref 15–500)
Eosinophils Relative: 2.6 %
HCT: 41.4 % (ref 35.0–45.0)
Hemoglobin: 14.5 g/dL (ref 11.7–15.5)
Lymphs Abs: 1967 cells/uL (ref 850–3900)
MCH: 33.6 pg — ABNORMAL HIGH (ref 27.0–33.0)
MCHC: 35 g/dL (ref 32.0–36.0)
MCV: 96.1 fL (ref 80.0–100.0)
MPV: 9.6 fL (ref 7.5–12.5)
Monocytes Relative: 8.9 %
Neutro Abs: 3815 cells/uL (ref 1500–7800)
Neutrophils Relative %: 57.8 %
Platelets: 195 10*3/uL (ref 140–400)
RBC: 4.31 10*6/uL (ref 3.80–5.10)
RDW: 12.3 % (ref 11.0–15.0)
Total Lymphocyte: 29.8 %
WBC: 6.6 10*3/uL (ref 3.8–10.8)

## 2022-04-16 LAB — COMPLETE METABOLIC PANEL WITH GFR
AG Ratio: 1.5 (calc) (ref 1.0–2.5)
ALT: 14 U/L (ref 6–29)
AST: 24 U/L (ref 10–35)
Albumin: 4.1 g/dL (ref 3.6–5.1)
Alkaline phosphatase (APISO): 69 U/L (ref 37–153)
BUN: 14 mg/dL (ref 7–25)
CO2: 27 mmol/L (ref 20–32)
Calcium: 9.5 mg/dL (ref 8.6–10.4)
Chloride: 103 mmol/L (ref 98–110)
Creat: 0.8 mg/dL (ref 0.60–1.00)
Globulin: 2.8 g/dL (calc) (ref 1.9–3.7)
Glucose, Bld: 97 mg/dL (ref 65–99)
Potassium: 4.1 mmol/L (ref 3.5–5.3)
Sodium: 139 mmol/L (ref 135–146)
Total Bilirubin: 0.9 mg/dL (ref 0.2–1.2)
Total Protein: 6.9 g/dL (ref 6.1–8.1)
eGFR: 78 mL/min/{1.73_m2} (ref >=60)

## 2022-04-16 LAB — LIPID PANEL W/REFLEX DIRECT LDL
Cholesterol: 234 mg/dL — ABNORMAL HIGH (ref <200)
HDL: 77 mg/dL (ref >=50)
LDL Cholesterol (Calc): 136 mg/dL (calc) — ABNORMAL HIGH
Non-HDL Cholesterol (Calc): 157 mg/dL (calc) — ABNORMAL HIGH (ref <130)
Total CHOL/HDL Ratio: 3 (calc) (ref <5.0)
Triglycerides: 99 mg/dL (ref <150)

## 2022-04-16 LAB — TSH: TSH: 4.31 mIU/L (ref 0.40–4.50)

## 2022-04-16 LAB — VITAMIN D 25 HYDROXY (VIT D DEFICIENCY, FRACTURES): Vit D, 25-Hydroxy: 42 ng/mL (ref 30–100)

## 2022-04-20 ENCOUNTER — Other Ambulatory Visit: Payer: Self-pay | Admitting: Family Medicine

## 2022-04-23 ENCOUNTER — Ambulatory Visit (INDEPENDENT_AMBULATORY_CARE_PROVIDER_SITE_OTHER): Payer: Medicare HMO | Admitting: Family Medicine

## 2022-04-23 ENCOUNTER — Encounter: Payer: Self-pay | Admitting: Family Medicine

## 2022-04-23 VITALS — BP 134/96 | HR 98 | Ht 66.0 in | Wt 174.4 lb

## 2022-04-23 DIAGNOSIS — J432 Centrilobular emphysema: Secondary | ICD-10-CM | POA: Diagnosis not present

## 2022-04-23 DIAGNOSIS — E039 Hypothyroidism, unspecified: Secondary | ICD-10-CM

## 2022-04-23 DIAGNOSIS — I10 Essential (primary) hypertension: Secondary | ICD-10-CM

## 2022-04-23 DIAGNOSIS — Z23 Encounter for immunization: Secondary | ICD-10-CM

## 2022-04-23 DIAGNOSIS — J449 Chronic obstructive pulmonary disease, unspecified: Secondary | ICD-10-CM | POA: Diagnosis not present

## 2022-04-23 MED ORDER — ALBUTEROL SULFATE HFA 108 (90 BASE) MCG/ACT IN AERS: INHALATION_SPRAY | RESPIRATORY_TRACT | 3 refills | Status: DC

## 2022-04-23 MED ORDER — LISINOPRIL-HYDROCHLOROTHIAZIDE 20-25 MG PO TABS: 1.0000 | ORAL_TABLET | Freq: Every day | ORAL | 3 refills | Status: DC

## 2022-04-23 MED ORDER — LEVOTHYROXINE SODIUM 25 MCG PO TABS: 25.0000 ug | ORAL_TABLET | Freq: Every day | ORAL | 0 refills | Status: DC

## 2022-04-23 MED ORDER — BUDESONIDE-FORMOTEROL FUMARATE 160-4.5 MCG/ACT IN AERO: INHALATION_SPRAY | RESPIRATORY_TRACT | 9 refills | Status: DC

## 2022-04-23 NOTE — Assessment & Plan Note (Signed)
Continue daily Symbicort with albuterol as needed.

## 2022-04-23 NOTE — Assessment & Plan Note (Signed)
Recent TSH was at the upper limits of normal.  She is having increased symptoms.  We will add levothyroxine 25 mcg on and recheck TSH in approximately 8 weeks.

## 2022-04-23 NOTE — Progress Notes (Signed)
Jennifer [name] - 76 y.o. female MRN [id number]  Date of birth: 31-Dec-1945  Subjective Chief Complaint  Patient presents with   Hypertension    HPI Jennifer [name] is a 76 year old female here today for follow-up visit.  She did recently have a fall at home.  Reports that she fell asleep with her legs crossed and woke up with her leg asleep.  She tried to stand and her leg gave out and she fell.  She has bruising on her arm.  No loss of consciousness and she did not hit her head.  She has full range of motion of the arm.  Blood pressure is elevated.  Currently taking lisinopril/hydrochlorothiazide 10/12.5 mg daily.  She was previously on 20/25 mg.  Denies symptoms related to hypertension including chest pain, shortness of breath, palpitations, headaches or vision changes.  Feels like she needs to be back on thyroid medication.  She has had decreased energy and some weight gain.  Most recent TSH was at the higher end of normal.  She does need renewals of her inhalers.  These continue to work well for her.  She would like to have her pneumonia vaccine today.  ROS:  A comprehensive ROS was completed and negative except as noted per HPI  Allergies  Allergen Reactions   Prednisone Other (See Comments)    psychological reaction, hallucinations, Paranoia   Grass Pollen(K-O-R-T-Swt Vern) Other (See Comments)   Statins Other (See Comments)    Other reaction(s): muscle/joint pain Weakness and tiredness    Past Medical History:  Diagnosis Date   Anxiety    Arthritis    Asthma    COPD (chronic obstructive pulmonary disease) (HCC) 11/03/2018   Dyspnea    aT TIMES WITH ASTHMA ATTACKS   Familial hypercholanemia 11/03/2018   HLD (hyperlipidemia) 11/03/2018   HTN (hypertension) 11/03/2018   Hypothyroid 11/03/2018   Pneumonia    Vitamin D deficiency 11/03/2018   Von Willebrand disease (HCC) possible 11/03/2018   Seen in documentation from prior provider    Past Surgical History:   Procedure Laterality Date   APPENDECTOMY  1978   BIOPSY ENDOMETRIAL     CESAREAN SECTION     triplets   TOTAL HIP ARTHROPLASTY Right 08/02/2019   Procedure: TOTAL  RIGHT  HIP ARTHROPLASTY ANTERIOR APPROACH;  Surgeon: Durene Romans, MD;  Location: [facility];  Service: Orthopedics;  Laterality: Right;  70 mins   TOTAL HIP ARTHROPLASTY Left 09/06/2019   Procedure: TOTAL HIP ARTHROPLASTY ANTERIOR APPROACH;  Surgeon: Durene Romans, MD;  Location: [facility];  Service: Orthopedics;  Laterality: Left;  70 mins   TUBAL LIGATION      Social History   Socioeconomic History   Marital status: Single    Spouse name: Not on file   Number of children: Not on file   Years of education: Not on file   Highest education level: Not on file  Occupational History   Not on file  Tobacco Use   Smoking status: Former    Packs/day: 0.50    Years: 15.00    Total pack years: 7.50    Types: Cigarettes    Quit date: 07/22/2014    Years since quitting: 7.7   Smokeless tobacco: Never  Vaping Use   Vaping Use: Never used  Substance and Sexual Activity   Alcohol use: Not Currently   Drug use: Never   Sexual activity: Not Currently    Birth control/protection: Abstinence  Other Topics Concern   Not on file  Social  History Narrative   Not on file   Social Determinants of Health   Financial Resource Strain: Not on file  Food Insecurity: Not on file  Transportation Needs: Not on file  Physical Activity: Not on file  Stress: Not on file  Social Connections: Not on file    Family History  Problem Relation Age of Onset   Parkinson's disease Mother    Cancer Father        prostate   Asthma Brother     Health Maintenance  Topic Date Due   COVID-19 Vaccine (3 - Pfizer series) 09/08/2022 (Originally 02/16/2020)   Zoster Vaccines- Shingrix (1 of 2) 09/08/2022 (Originally 07/22/1998)   MAMMOGRAM  04/24/2023 (Originally 07/22/1998)   DEXA SCAN  04/24/2023 (Originally 07/22/2013)   COLONOSCOPY (Pts  45-23yrs Insurance coverage will need to be confirmed)  04/24/2023 (Originally 07/22/1993)   TETANUS/TDAP  03/04/2027   Pneumonia Vaccine 53+ Years old  Completed   Hepatitis C Screening  Completed   HPV VACCINES  Aged Out   INFLUENZA VACCINE  Discontinued     ----------------------------------------------------------------------------------------------------------------------------------------------------------------------------------------------------------------- Physical Exam BP (!) 134/96 (BP Location: Left Arm, Patient Position: Sitting, Cuff Size: Normal)   Pulse 98   Ht 5\' 6"  (1.676 m)   Wt 174 lb 6.4 oz (79.1 kg)   SpO2 96%   BMI 28.15 kg/m   Physical Exam Constitutional:      Appearance: Normal appearance.  Cardiovascular:     Rate and Rhythm: Normal rate and regular rhythm.  Pulmonary:     Effort: Pulmonary effort is normal.     Breath sounds: Normal breath sounds.  Musculoskeletal:     Cervical back: Neck supple.  Skin:    Comments: She does have some bruising on the right upper arm.  Range of motion is normal.  No significant tenderness.  Neurological:     General: No focal deficit present.     Mental Status: She is alert and oriented to person, place, and time.  Psychiatric:        Mood and Affect: Mood normal.        Behavior: Behavior normal.     ------------------------------------------------------------------------------------------------------------------------------------------------------------------------------------------------------------------- Assessment and Plan  Hypertension goal BP (blood pressure) < 140/90 Blood pressure is elevated today.  We will increase lisinopril/hydrochlorothiazide to 20/25 mg daily.  She will monitor blood pressures at home.  COPD (chronic obstructive pulmonary disease) (HCC) Continue daily Symbicort with albuterol as needed.    Hypothyroid Recent TSH was at the upper limits of normal.  She is having increased  symptoms.  We will add levothyroxine 25 mcg on and recheck TSH in approximately 8 weeks.   Meds ordered this encounter  Medications   lisinopril-hydrochlorothiazide (ZESTORETIC) 20-25 MG tablet    Sig: Take 1 tablet by mouth daily.    Dispense:  90 tablet    Refill:  3   levothyroxine (SYNTHROID) 25 MCG tablet    Sig: Take 1 tablet (25 mcg total) by mouth daily.    Dispense:  90 tablet    Refill:  0   budesonide-formoterol (SYMBICORT) 160-4.5 MCG/ACT inhaler    Sig: TAKE 2 PUFFS BY MOUTH TWICE A DAY    Dispense:  10.2 each    Refill:  9   albuterol (VENTOLIN HFA) 108 (90 Base) MCG/ACT inhaler    Sig: INHALE 1-2 PUFFS INTO THE LUNGS EVERY 4 HOURS AS NEEDED FOR WHEEZING OR SHORTNESS OF BREATH.    Dispense:  17 each    Refill:  3  Return in about 6 months (around 10/24/2022) for HTN.    This visit occurred during the SARS-CoV-2 public health emergency.  Safety protocols were in place, including screening questions prior to the visit, additional usage of staff PPE, and extensive cleaning of exam room while observing appropriate contact time as indicated for disinfecting solutions.

## 2022-04-23 NOTE — Assessment & Plan Note (Addendum)
Blood pressure is elevated today.  We will increase lisinopril/hydrochlorothiazide to 20/25 mg daily.  She will monitor blood pressures at home.

## 2022-04-23 NOTE — Patient Instructions (Addendum)
Great to see you today! Increase lisinopril/hctz to 20/25mg .  Add levothyroxine daily.  Return for labs in 8 weeks to recheck thyroid. See me again in 6 months.

## 2022-05-01 ENCOUNTER — Telehealth: Payer: Self-pay

## 2022-05-01 NOTE — Telephone Encounter (Signed)
Patient left a vm msg requesting advise from provider. Patient stated the site where she was given the pneumococcal vaccine gave her a reaction. Per patient, since getting the vaccine last Wednesday, the area has been red, swollen with a knot and warm to the touch. Should the patient make another appointment for an evaluation? Or will this resolve on it's own? Please advise, thanks.

## 2022-05-02 NOTE — Telephone Encounter (Signed)
Task completed. Left a detailed vm msg for patient regarding provider's note. Patient notified to contact the clinic if she still has any questions. Direct call back info provided.

## 2022-06-15 ENCOUNTER — Other Ambulatory Visit: Payer: Self-pay | Admitting: Family Medicine

## 2022-06-15 DIAGNOSIS — Z9109 Other allergy status, other than to drugs and biological substances: Secondary | ICD-10-CM

## 2022-06-24 DIAGNOSIS — E039 Hypothyroidism, unspecified: Secondary | ICD-10-CM | POA: Diagnosis not present

## 2022-06-25 LAB — TSH: TSH: 2.21 mIU/L (ref 0.40–4.50)

## 2022-07-23 ENCOUNTER — Other Ambulatory Visit: Payer: Self-pay | Admitting: Family Medicine

## 2022-10-22 ENCOUNTER — Telehealth: Payer: Self-pay | Admitting: Family Medicine

## 2022-10-22 NOTE — Telephone Encounter (Signed)
I think we can wait until August to recheck labs unless there is something specific she is wanting checked.   CM

## 2022-10-22 NOTE — Telephone Encounter (Signed)
Pt called to move her f/u appt for HTN to 11/27/2022. She would like to come in a few days prior to have her bloodwork done.She would like you to place the orders.

## 2022-10-23 ENCOUNTER — Other Ambulatory Visit: Payer: Self-pay

## 2022-10-23 MED ORDER — LEVOTHYROXINE SODIUM 25 MCG PO TABS: 25.0000 ug | ORAL_TABLET | Freq: Every day | ORAL | 1 refills | Status: DC

## 2022-10-23 MED ORDER — LISINOPRIL-HYDROCHLOROTHIAZIDE 20-25 MG PO TABS: 1.0000 | ORAL_TABLET | Freq: Every day | ORAL | 1 refills | Status: DC

## 2022-10-23 NOTE — Telephone Encounter (Signed)
Refilled Levothyroxine and lisinopril ./ HCTZ to [facility].  And Made this her preferred pharmacy in chart.

## 2022-10-23 NOTE — Telephone Encounter (Signed)
I would recommend keeping appt. To follow up on blood pressure.  We need to keep an eye on blood pressure every 6 months.

## 2022-10-24 NOTE — Telephone Encounter (Signed)
Patient advised.

## 2022-10-27 ENCOUNTER — Ambulatory Visit: Payer: Medicare HMO | Admitting: Family Medicine

## 2022-11-03 ENCOUNTER — Telehealth: Payer: Self-pay | Admitting: Family Medicine

## 2022-11-03 NOTE — Telephone Encounter (Signed)
Contacted Jennifer [name] to schedule their annual wellness visit. Patient declined to schedule AWV at this time.  Jennifer [name] Patient [employer] administrator II Direct Dial: [phone]

## 2022-11-27 ENCOUNTER — Encounter: Payer: Self-pay | Admitting: Family Medicine

## 2022-11-27 ENCOUNTER — Ambulatory Visit (INDEPENDENT_AMBULATORY_CARE_PROVIDER_SITE_OTHER): Payer: Medicare HMO | Admitting: Family Medicine

## 2022-11-27 ENCOUNTER — Telehealth: Payer: Self-pay

## 2022-11-27 VITALS — BP 127/83 | HR 112 | Ht 66.0 in | Wt 163.0 lb

## 2022-11-27 DIAGNOSIS — J449 Chronic obstructive pulmonary disease, unspecified: Secondary | ICD-10-CM | POA: Diagnosis not present

## 2022-11-27 DIAGNOSIS — I10 Essential (primary) hypertension: Secondary | ICD-10-CM

## 2022-11-27 DIAGNOSIS — E559 Vitamin D deficiency, unspecified: Secondary | ICD-10-CM

## 2022-11-27 DIAGNOSIS — E039 Hypothyroidism, unspecified: Secondary | ICD-10-CM | POA: Diagnosis not present

## 2022-11-27 DIAGNOSIS — E785 Hyperlipidemia, unspecified: Secondary | ICD-10-CM

## 2022-11-27 DIAGNOSIS — Z9981 Dependence on supplemental oxygen: Secondary | ICD-10-CM

## 2022-11-27 MED ORDER — AMBULATORY NON FORMULARY MEDICATION: 0 refills | Status: DC

## 2022-11-27 NOTE — Telephone Encounter (Signed)
Rx rcvd from Dr. Zigmund Daniel.  Copy of office notes needed to fax along with Rx, insurance card, and facesheet to [employer].

## 2022-11-27 NOTE — Assessment & Plan Note (Signed)
She will continue Symbicort daily with albuterol as needed.  She does have home O2.  I am prescribing portable oxygen generator as well for her to use while traveling.

## 2022-11-27 NOTE — Assessment & Plan Note (Signed)
Blood pressure is well-controlled.  Recommend continuation of lisinopril with hydrochlorothiazide at current strength.

## 2022-11-27 NOTE — Progress Notes (Signed)
Jennifer [name] - 77 y.o. female MRN [id number]  Date of birth: 12-Jan-1946  Subjective Chief Complaint  Patient presents with   Hypertension    HPI Jennifer [name] is a 77 y.o. female here today for follow up visit.   She reports she is feeling well.    She continues on Symbicort daily with albuterol as needed for COPD.  She does have home oxygen that she uses as needed at night and or if ill.  She is requesting a portable oxygen concentrator to use when traveling as she does travel quite a bit between [place] and [place].  Blood pressure mains well-controlled with lisinopril/hydrochlorothiazide.  Denies side effects at this time.  She has not had chest pain, increased shortness of breath, palpitations, headaches or vision changes.  Feels well with current strength of levothyroxine.  ROS:  A comprehensive ROS was completed and negative except as noted per HPI  Allergies  Allergen Reactions   Prednisone Other (See Comments)    psychological reaction, hallucinations, Paranoia   Grass Pollen(K-O-R-T-Swt Vern) Other (See Comments)   Statins Other (See Comments)    Other reaction(s): muscle/joint pain Weakness and tiredness    Past Medical History:  Diagnosis Date   Anxiety    Arthritis    Asthma    COPD (chronic obstructive pulmonary disease) ([place]) 11/03/2018   Dyspnea    aT TIMES WITH ASTHMA ATTACKS   Familial hypercholanemia 11/03/2018   HLD (hyperlipidemia) 11/03/2018   HTN (hypertension) 11/03/2018   Hypothyroid 11/03/2018   Pneumonia    Vitamin D deficiency 11/03/2018   Von Willebrand disease ([place]) possible 11/03/2018   Seen in documentation from prior provider    Past Surgical History:  Procedure Laterality Date   APPENDECTOMY  1978   BIOPSY ENDOMETRIAL     CESAREAN SECTION     triplets   TOTAL HIP ARTHROPLASTY Right 08/02/2019   Procedure: TOTAL  RIGHT  HIP ARTHROPLASTY ANTERIOR APPROACH;  Surgeon: Paralee Cancel, MD;  Location: [facility];  Service:  Orthopedics;  Laterality: Right;  70 mins   TOTAL HIP ARTHROPLASTY Left 09/06/2019   Procedure: TOTAL HIP ARTHROPLASTY ANTERIOR APPROACH;  Surgeon: Paralee Cancel, MD;  Location: [facility];  Service: Orthopedics;  Laterality: Left;  70 mins   TUBAL LIGATION      Social History   Socioeconomic History   Marital status: Single    Spouse name: Not on file   Number of children: Not on file   Years of education: Not on file   Highest education level: Not on file  Occupational History   Not on file  Tobacco Use   Smoking status: Former    Packs/day: 0.50    Years: 15.00    Additional pack years: 0.00    Total pack years: 7.50    Types: Cigarettes    Quit date: 07/22/2014    Years since quitting: 8.3   Smokeless tobacco: Never  Vaping Use   Vaping Use: Never used  Substance and Sexual Activity   Alcohol use: Not Currently   Drug use: Never   Sexual activity: Not Currently    Birth control/protection: Abstinence  Other Topics Concern   Not on file  Social History Narrative   Not on file   Social Determinants of Health   Financial Resource Strain: Not on file  Food Insecurity: Not on file  Transportation Needs: Not on file  Physical Activity: Not on file  Stress: Not on file  Social Connections: Not on file  Family History  Problem Relation Age of Onset   Parkinson's disease Mother    Cancer Father        prostate   Asthma Brother     Health Maintenance  Topic Date Due   MAMMOGRAM  04/24/2023 (Originally 07/22/1998)   DEXA SCAN  04/24/2023 (Originally 07/22/2013)   COLONOSCOPY (Pts 45-37yrs Insurance coverage will need to be confirmed)  04/24/2023 (Originally 07/22/1993)   Medicare Annual Wellness (AWV)  05/30/2023 (Originally Sep 13, 1945)   COVID-19 Vaccine (3 - 2023-24 season) 12/13/2023 (Originally 05/09/2022)   Zoster Vaccines- Shingrix (1 of 2) 02/27/2024 (Originally 07/22/1998)   DTaP/Tdap/Td (2 - Td or Tdap) 03/04/2027   Pneumonia Vaccine 58+ Years old   Completed   Hepatitis C Screening  Completed   HPV VACCINES  Aged Out   INFLUENZA VACCINE  Discontinued     ----------------------------------------------------------------------------------------------------------------------------------------------------------------------------------------------------------------- Physical Exam BP 127/83 (BP Location: Left Arm, Patient Position: Sitting, Cuff Size: Normal)   Pulse (!) 112   Ht 5\' 6"  (1.676 m)   Wt 163 lb (73.9 kg)   SpO2 91%   BMI 26.31 kg/m   Physical Exam Constitutional:      Appearance: Normal appearance.  HENT:     Head: Normocephalic and atraumatic.  Eyes:     General: No scleral icterus. Cardiovascular:     Rate and Rhythm: Normal rate and regular rhythm.  Pulmonary:     Effort: Pulmonary effort is normal.     Breath sounds: Normal breath sounds.  Neurological:     Mental Status: She is alert.  Psychiatric:        Mood and Affect: Mood normal.        Behavior: Behavior normal.     ------------------------------------------------------------------------------------------------------------------------------------------------------------------------------------------------------------------- Assessment and Plan  Hypertension goal BP (blood pressure) < 140/90 Blood pressure is well-controlled.  Recommend continuation of lisinopril with hydrochlorothiazide at current strength.  COPD (chronic obstructive pulmonary disease) ([place]) She will continue Symbicort daily with albuterol as needed.  She does have home O2.  I am prescribing portable oxygen generator as well for her to use while traveling.  Hypothyroid Feels good with current dose of levothyroxine.  Will plan to continue.   Meds ordered this encounter  Medications   AMBULATORY NON FORMULARY MEDICATION    Sig: Please provide portable oxygen concentrator.  Flow of 2-3L/min as needed.  Dx: COPD J44.9, On Home O2 Z99.81    Dispense:  1 Device    Refill:  0     Return in about 5 months (around 04/29/2023) for HTN/Thyroid.    This visit occurred during the SARS-CoV-2 public health emergency.  Safety protocols were in place, including screening questions prior to the visit, additional usage of staff PPE, and extensive cleaning of exam room while observing appropriate contact time as indicated for disinfecting solutions.

## 2022-11-27 NOTE — Assessment & Plan Note (Signed)
Feels good with current dose of levothyroxine.  Will plan to continue.

## 2022-12-01 ENCOUNTER — Other Ambulatory Visit: Payer: Self-pay

## 2022-12-01 MED ORDER — AMBULATORY NON FORMULARY MEDICATION: 0 refills | Status: AC

## 2022-12-01 NOTE — Telephone Encounter (Signed)
I have faxed over everything and got confirmation it was received they will call patient to set everything up task complete.

## 2022-12-08 DIAGNOSIS — H43813 Vitreous degeneration, bilateral: Secondary | ICD-10-CM | POA: Diagnosis not present

## 2022-12-08 DIAGNOSIS — H25813 Combined forms of age-related cataract, bilateral: Secondary | ICD-10-CM | POA: Diagnosis not present

## 2022-12-08 DIAGNOSIS — H526 Other disorders of refraction: Secondary | ICD-10-CM | POA: Diagnosis not present

## 2023-01-27 DIAGNOSIS — H524 Presbyopia: Secondary | ICD-10-CM | POA: Diagnosis not present

## 2023-03-30 ENCOUNTER — Telehealth: Payer: Self-pay | Admitting: Family Medicine

## 2023-03-30 NOTE — Telephone Encounter (Signed)
Patient's is requesting a refill on albuterol 108 mcg/act inhaler Until appt on 04-29-23 [facility] [address] [phone]

## 2023-03-31 ENCOUNTER — Other Ambulatory Visit: Payer: Self-pay | Admitting: Family Medicine

## 2023-03-31 DIAGNOSIS — J432 Centrilobular emphysema: Secondary | ICD-10-CM

## 2023-03-31 MED ORDER — ALBUTEROL SULFATE HFA 108 (90 BASE) MCG/ACT IN AERS: INHALATION_SPRAY | RESPIRATORY_TRACT | 3 refills | Status: DC

## 2023-04-22 ENCOUNTER — Other Ambulatory Visit: Payer: Self-pay

## 2023-04-22 DIAGNOSIS — E559 Vitamin D deficiency, unspecified: Secondary | ICD-10-CM

## 2023-04-22 DIAGNOSIS — I10 Essential (primary) hypertension: Secondary | ICD-10-CM

## 2023-04-22 DIAGNOSIS — E785 Hyperlipidemia, unspecified: Secondary | ICD-10-CM

## 2023-04-22 DIAGNOSIS — E038 Other specified hypothyroidism: Secondary | ICD-10-CM | POA: Diagnosis not present

## 2023-04-23 LAB — COMPREHENSIVE METABOLIC PANEL
ALT: 10 IU/L (ref 0–32)
AST: 28 IU/L (ref 0–40)
Albumin: 4.2 g/dL (ref 3.8–4.8)
Alkaline Phosphatase: 71 IU/L (ref 44–121)
BUN/Creatinine Ratio: 19 (ref 12–28)
BUN: 17 mg/dL (ref 8–27)
Bilirubin Total: 0.8 mg/dL (ref 0.0–1.2)
CO2: 23 mmol/L (ref 20–29)
Calcium: 10.4 mg/dL — ABNORMAL HIGH (ref 8.7–10.3)
Chloride: 97 mmol/L (ref 96–106)
Creatinine, Ser: 0.88 mg/dL (ref 0.57–1.00)
Globulin, Total: 2.6 g/dL (ref 1.5–4.5)
Glucose: 94 mg/dL (ref 70–99)
Potassium: 3.8 mmol/L (ref 3.5–5.2)
Sodium: 134 mmol/L (ref 134–144)
Total Protein: 6.8 g/dL (ref 6.0–8.5)
eGFR: 69 mL/min/{1.73_m2} (ref >59)

## 2023-04-23 LAB — CBC WITH DIFFERENTIAL/PLATELET
Basophils Absolute: 0.1 10*3/uL (ref 0.0–0.2)
Basos: 1 %
EOS (ABSOLUTE): 0.2 10*3/uL (ref 0.0–0.4)
Eos: 4 %
Hematocrit: 40.2 % (ref 34.0–46.6)
Hemoglobin: 14.1 g/dL (ref 11.1–15.9)
Immature Grans (Abs): 0 10*3/uL (ref 0.0–0.1)
Immature Granulocytes: 0 %
Lymphocytes Absolute: 1.6 10*3/uL (ref 0.7–3.1)
Lymphs: 26 %
MCH: 32.9 pg (ref 26.6–33.0)
MCHC: 35.1 g/dL (ref 31.5–35.7)
MCV: 94 fL (ref 79–97)
Monocytes Absolute: 0.6 10*3/uL (ref 0.1–0.9)
Monocytes: 9 %
Neutrophils Absolute: 3.6 10*3/uL (ref 1.4–7.0)
Neutrophils: 60 %
Platelets: 141 10*3/uL — ABNORMAL LOW (ref 150–450)
RBC: 4.28 x10E6/uL (ref 3.77–5.28)
RDW: 12.2 % (ref 11.7–15.4)
WBC: 6.1 10*3/uL (ref 3.4–10.8)

## 2023-04-23 LAB — LIPID PANEL WITH LDL/HDL RATIO
Cholesterol, Total: 211 mg/dL — ABNORMAL HIGH (ref 100–199)
HDL: 75 mg/dL (ref >39)
LDL Chol Calc (NIH): 123 mg/dL — ABNORMAL HIGH (ref 0–99)
LDL/HDL Ratio: 1.6 ratio (ref 0.0–3.2)
Triglycerides: 71 mg/dL (ref 0–149)
VLDL Cholesterol Cal: 13 mg/dL (ref 5–40)

## 2023-04-23 LAB — VITAMIN D 25 HYDROXY (VIT D DEFICIENCY, FRACTURES): Vit D, 25-Hydroxy: 19.9 ng/mL — ABNORMAL LOW (ref 30.0–100.0)

## 2023-04-23 LAB — TSH: TSH: 2.88 u[IU]/mL (ref 0.450–4.500)

## 2023-04-29 ENCOUNTER — Ambulatory Visit: Payer: Medicare HMO | Admitting: Sports Medicine

## 2023-04-29 ENCOUNTER — Ambulatory Visit (INDEPENDENT_AMBULATORY_CARE_PROVIDER_SITE_OTHER): Payer: Medicare HMO | Admitting: Family Medicine

## 2023-04-29 ENCOUNTER — Encounter: Payer: Self-pay | Admitting: Family Medicine

## 2023-04-29 VITALS — BP 118/75 | HR 113 | Ht 66.0 in | Wt 159.0 lb

## 2023-04-29 DIAGNOSIS — E039 Hypothyroidism, unspecified: Secondary | ICD-10-CM

## 2023-04-29 DIAGNOSIS — J432 Centrilobular emphysema: Secondary | ICD-10-CM

## 2023-04-29 DIAGNOSIS — Z9109 Other allergy status, other than to drugs and biological substances: Secondary | ICD-10-CM

## 2023-04-29 DIAGNOSIS — I10 Essential (primary) hypertension: Secondary | ICD-10-CM | POA: Diagnosis not present

## 2023-04-29 DIAGNOSIS — Z789 Other specified health status: Secondary | ICD-10-CM | POA: Diagnosis not present

## 2023-04-29 DIAGNOSIS — Z5320 Procedure and treatment not carried out because of patient's decision for unspecified reasons: Secondary | ICD-10-CM | POA: Diagnosis not present

## 2023-04-29 DIAGNOSIS — J449 Chronic obstructive pulmonary disease, unspecified: Secondary | ICD-10-CM

## 2023-04-29 DIAGNOSIS — E785 Hyperlipidemia, unspecified: Secondary | ICD-10-CM

## 2023-04-29 MED ORDER — ALBUTEROL SULFATE HFA 108 (90 BASE) MCG/ACT IN AERS
INHALATION_SPRAY | RESPIRATORY_TRACT | 3 refills | Status: DC
Start: 2023-04-29 — End: 2024-06-06

## 2023-04-29 MED ORDER — MONTELUKAST SODIUM 10 MG PO TABS
10.0000 mg | ORAL_TABLET | Freq: Every day | ORAL | 3 refills | Status: DC
Start: 2023-04-29 — End: 2024-06-06

## 2023-04-29 MED ORDER — LEVOTHYROXINE SODIUM 25 MCG PO TABS: 25.0000 ug | ORAL_TABLET | Freq: Every day | ORAL | 1 refills | Status: DC

## 2023-04-29 MED ORDER — LISINOPRIL-HYDROCHLOROTHIAZIDE 20-25 MG PO TABS: 1.0000 | ORAL_TABLET | Freq: Every day | ORAL | 1 refills | Status: DC

## 2023-04-29 NOTE — Assessment & Plan Note (Signed)
Intolerant to statins.  Continue to monitor with routine lab work.

## 2023-04-29 NOTE — Assessment & Plan Note (Signed)
Blood pressure is well-controlled.  Recommend continuation of lisinopril with hydrochlorothiazide at current strength.

## 2023-04-29 NOTE — Assessment & Plan Note (Signed)
Feels good with current dose of levothyroxine.  Recent TSH within normal limits.  Will plan to continue.

## 2023-04-29 NOTE — Progress Notes (Signed)
Jennifer [name] - 77 y.o. female MRN [id number]  Date of birth: 24-Mar-1946  Subjective Chief Complaint  Patient presents with   Hypertension   Hypothyroidism    HPI Jennifer [name] is a 77 y.o. female here today for follow up visit.   She reports that she is doing pretty well..   She had labs completed prior to visit today which we reviewed in detail.  Vitamin D levels noted to be low.  Mild elevation in LDL. She feels good with current strength of levothyroxine.  Her blood pressure remains well-controlled with lisinopril thiazide.  She denies side effects to current strength medications.  She has not had chest pain, shortness of breath, palpitations, headaches or vision changes.  She is due for mammogram, colon cancer and bone density screening however she does not want to have anything done.  ROS:  A comprehensive ROS was completed and negative except as noted per HPI  Allergies  Allergen Reactions   Prednisone Other (See Comments)    psychological reaction, hallucinations, Paranoia   Grass Pollen(K-O-R-T-Swt Vern) Other (See Comments)   Statins Other (See Comments)    Other reaction(s): muscle/joint pain Weakness and tiredness    Past Medical History:  Diagnosis Date   Anxiety    Arthritis    Asthma    COPD (chronic obstructive pulmonary disease) (HCC) 11/03/2018   Dyspnea    aT TIMES WITH ASTHMA ATTACKS   Familial hypercholanemia 11/03/2018   HLD (hyperlipidemia) 11/03/2018   HTN (hypertension) 11/03/2018   Hypothyroid 11/03/2018   Pneumonia    Vitamin D deficiency 11/03/2018   Von Willebrand disease (HCC) possible 11/03/2018   Seen in documentation from prior provider    Past Surgical History:  Procedure Laterality Date   APPENDECTOMY  1978   BIOPSY ENDOMETRIAL     CESAREAN SECTION     triplets   TOTAL HIP ARTHROPLASTY Right 08/02/2019   Procedure: TOTAL  RIGHT  HIP ARTHROPLASTY ANTERIOR APPROACH;  Surgeon: Durene Romans, MD;  Location: [facility];  Service:  Orthopedics;  Laterality: Right;  70 mins   TOTAL HIP ARTHROPLASTY Left 09/06/2019   Procedure: TOTAL HIP ARTHROPLASTY ANTERIOR APPROACH;  Surgeon: Durene Romans, MD;  Location: [facility];  Service: Orthopedics;  Laterality: Left;  70 mins   TUBAL LIGATION      Social History   Socioeconomic History   Marital status: Single    Spouse name: Not on file   Number of children: Not on file   Years of education: Not on file   Highest education level: Not on file  Occupational History   Not on file  Tobacco Use   Smoking status: Former    Current packs/day: 0.00    Average packs/day: 0.5 packs/day for 15.0 years (7.5 ttl pk-yrs)    Types: Cigarettes    Start date: 07/23/1999    Quit date: 07/22/2014    Years since quitting: 8.7   Smokeless tobacco: Never  Vaping Use   Vaping status: Never Used  Substance and Sexual Activity   Alcohol use: Not Currently   Drug use: Never   Sexual activity: Not Currently    Birth control/protection: Abstinence  Other Topics Concern   Not on file  Social History Narrative   Not on file   Social Determinants of Health   Financial Resource Strain: Not on file  Food Insecurity: Not on file  Transportation Needs: Not on file  Physical Activity: Not on file  Stress: Not on file  Social Connections: Unknown (01/21/2022)  Received from [facility]   Social Network    Social Network: Not on file    Family History  Problem Relation Age of Onset   Parkinson's disease Mother    Cancer Father        prostate   Asthma Brother     Health Maintenance  Topic Date Due   Colonoscopy  Never done   MAMMOGRAM  Never done   DEXA SCAN  Never done   [employer] Wellness (AWV)  05/30/2023 (Originally 14-Sep-1945)   COVID-19 Vaccine (3 - 2023-24 season) 12/13/2023 (Originally 05/09/2022)   Zoster Vaccines- Shingrix (1 of 2) 02/27/2024 (Originally 07/22/1998)   DTaP/Tdap/Td (2 - Td or Tdap) 03/04/2027   Pneumonia Vaccine 61+ Years old   Completed   Hepatitis C Screening  Completed   HPV VACCINES  Aged Out   INFLUENZA VACCINE  Discontinued     ----------------------------------------------------------------------------------------------------------------------------------------------------------------------------------------------------------------- Physical Exam BP 118/75   Pulse (!) 113 Comment: patient used rescue inhaler just before appointment today  Ht 5\' 6"  (1.676 m)   Wt 159 lb (72.1 kg) Comment: patient self reporting weight- refused to weigh in office  SpO2 92%   BMI 25.66 kg/m   Physical Exam Constitutional:      Appearance: Normal appearance.  HENT:     Head: Normocephalic and atraumatic.  Eyes:     General: No scleral icterus. Cardiovascular:     Rate and Rhythm: Normal rate and regular rhythm.  Pulmonary:     Effort: Pulmonary effort is normal.     Breath sounds: Normal breath sounds.  Neurological:     Mental Status: She is alert.  Psychiatric:        Mood and Affect: Mood normal.        Behavior: Behavior normal.     ------------------------------------------------------------------------------------------------------------------------------------------------------------------------------------------------------------------- Assessment and Plan  COPD (chronic obstructive pulmonary disease) (HCC) She will continue Symbicort daily with albuterol as needed.  She does have home O2.  Portable oxygen concentrator to use while traveling.  Hypertension goal BP (blood pressure) < 140/90 Blood pressure is well-controlled.  Recommend continuation of lisinopril with hydrochlorothiazide at current strength.  HLD (hyperlipidemia) Intolerant to statins.  Continue to monitor with routine lab work.   Hypothyroid Feels good with current dose of levothyroxine.  Recent TSH within normal limits.  Will plan to continue.  Colon cancer screening declined She declines colon cancer screening due to her  daughter's experience with complications.  She understands risk of not screening.  Mammogram declined Patient understands risk of not screening for breast cancer.  Declines mammogram.   Meds ordered this encounter  Medications   montelukast (SINGULAIR) 10 MG tablet    Sig: Take 1 tablet (10 mg total) by mouth at bedtime. TAKE 1 TABLET BY MOUTH EVERYDAY AT BEDTIME    Dispense:  90 tablet    Refill:  3   lisinopril-hydrochlorothiazide (ZESTORETIC) 20-25 MG tablet    Sig: Take 1 tablet by mouth daily.    Dispense:  90 tablet    Refill:  1   levothyroxine (SYNTHROID) 25 MCG tablet    Sig: Take 1 tablet (25 mcg total) by mouth daily.    Dispense:  90 tablet    Refill:  1   albuterol (VENTOLIN HFA) 108 (90 Base) MCG/ACT inhaler    Sig: INHALE 1-2 PUFFS INTO THE LUNGS EVERY 4 HOURS AS NEEDED FOR WHEEZING OR SHORTNESS OF BREATH.    Dispense:  17 each    Refill:  3  No follow-ups on file.    This visit occurred during the SARS-CoV-2 public health emergency.  Safety protocols were in place, including screening questions prior to the visit, additional usage of staff PPE, and extensive cleaning of exam room while observing appropriate contact time as indicated for disinfecting solutions.

## 2023-04-29 NOTE — Assessment & Plan Note (Signed)
She will continue Symbicort daily with albuterol as needed.  She does have home O2.  Portable oxygen concentrator to use while traveling.

## 2023-04-29 NOTE — Assessment & Plan Note (Signed)
She declines colon cancer screening due to her daughter's experience with complications.  She understands risk of not screening.

## 2023-04-29 NOTE — Assessment & Plan Note (Signed)
Patient understands risk of not screening for breast cancer.  Declines mammogram.

## 2023-05-05 ENCOUNTER — Telehealth: Payer: Self-pay | Admitting: Family Medicine

## 2023-05-05 NOTE — Telephone Encounter (Signed)
Patient called in wanting to know more information about blood work, specifically the platelets from CBC. Wants to know what is the next step or regimen and/or a follow up is needed. Please Advice.

## 2023-05-07 NOTE — Telephone Encounter (Signed)
Patient informed. 

## 2023-06-01 ENCOUNTER — Other Ambulatory Visit: Payer: Self-pay | Admitting: Family Medicine

## 2023-06-01 DIAGNOSIS — J432 Centrilobular emphysema: Secondary | ICD-10-CM

## 2023-07-18 ENCOUNTER — Other Ambulatory Visit: Payer: Self-pay | Admitting: Family Medicine

## 2023-07-18 DIAGNOSIS — J432 Centrilobular emphysema: Secondary | ICD-10-CM

## 2023-09-01 ENCOUNTER — Other Ambulatory Visit: Payer: Self-pay | Admitting: Family Medicine

## 2023-09-01 DIAGNOSIS — J432 Centrilobular emphysema: Secondary | ICD-10-CM

## 2023-10-01 ENCOUNTER — Other Ambulatory Visit: Payer: Self-pay | Admitting: Family Medicine

## 2023-10-01 DIAGNOSIS — J432 Centrilobular emphysema: Secondary | ICD-10-CM

## 2023-10-05 ENCOUNTER — Other Ambulatory Visit: Payer: Self-pay | Admitting: Family Medicine

## 2023-10-05 DIAGNOSIS — J432 Centrilobular emphysema: Secondary | ICD-10-CM

## 2023-10-05 NOTE — Telephone Encounter (Signed)
Copied from CRM [id number]. Topic: Clinical - Medication Refill >> Oct 05, 2023 11:51 AM Nila Nephew wrote: Most Recent Primary Care Visit:  Provider: Everrett Coombe  Department: [facility]  Visit Type: OFFICE VISIT  Date: 04/29/2023  Medication:  SYMBICORT 160-4.5 MCG/ACT inhaler  Has the patient contacted their pharmacy? Yes - Pharmacy requested but not all the way through?  Is this the correct pharmacy for this prescription? Yes  This is the patient's preferred pharmacy:  [facility] [address] [place] - [address] [place] [address] Phone: [phone] Fax: [phone]  Has the prescription been filled recently? No  Is the patient out of the medication? Yes - 2 days left  Has the patient been seen for an appointment in the last year OR does the patient have an upcoming appointment? Yes  Can we respond through MyChart? No - Phone Call  Agent: Please be advised that Rx refills may take up to 3 business days. We ask that you follow-up with your pharmacy.

## 2023-10-06 ENCOUNTER — Telehealth: Payer: Self-pay

## 2023-10-06 ENCOUNTER — Other Ambulatory Visit: Payer: Self-pay | Admitting: Family Medicine

## 2023-10-06 DIAGNOSIS — J432 Centrilobular emphysema: Secondary | ICD-10-CM

## 2023-10-06 NOTE — Telephone Encounter (Signed)
Fyi- Patient called -  Symbicort refill sent on 10/01/23 failed  Phoned in rx to pharmacy and gave verbally.

## 2023-10-06 NOTE — Telephone Encounter (Signed)
Copied from CRM [id number]. Topic: Clinical - Medication Refill >> Oct 06, 2023 11:59 AM Gildardo Pounds wrote: Most Recent Primary Care Visit:  Provider: Everrett Coombe  Department: [facility]  Visit Type: OFFICE VISIT  Date: 04/29/2023  Medication: SYMBICORT 160-4.5 MCG/ACT inhaler  Has the patient contacted their pharmacy? Yes (Agent: If no, request that the patient contact the pharmacy for the refill. If patient does not wish to contact the pharmacy document the reason why and proceed with request.) (Agent: If yes, when and what did the pharmacy advise?)  Is this the correct pharmacy for this prescription? Yes If no, delete pharmacy and type the correct one.  This is the patient's preferred pharmacy:  [facility] [address] [place] - [address] [place] [address] Phone: [phone] Fax: [phone]  [facility] [phone] - [place], [place] - [address] [place] [address] Phone: [phone] Fax: [phone]   Has the prescription been filled recently? Yes  Is the patient out of the medication? No  Has the patient been seen for an appointment in the last year OR does the patient have an upcoming appointment? Yes  Can we respond through MyChart? Yes  Agent: Please be advised that Rx refills may take up to 3 business days. We ask that you follow-up with your pharmacy.

## 2023-10-06 NOTE — Telephone Encounter (Signed)
Medication was sent to the pharmacy

## 2023-10-13 ENCOUNTER — Ambulatory Visit: Payer: Medicare HMO | Admitting: Sports Medicine

## 2023-10-13 ENCOUNTER — Other Ambulatory Visit: Payer: Self-pay

## 2023-11-12 ENCOUNTER — Other Ambulatory Visit: Payer: Self-pay | Admitting: Family Medicine

## 2023-11-12 DIAGNOSIS — J432 Centrilobular emphysema: Secondary | ICD-10-CM

## 2023-11-17 ENCOUNTER — Other Ambulatory Visit: Payer: Self-pay | Admitting: Family Medicine

## 2023-11-17 ENCOUNTER — Telehealth: Payer: Self-pay

## 2023-11-17 DIAGNOSIS — J432 Centrilobular emphysema: Secondary | ICD-10-CM

## 2023-11-17 DIAGNOSIS — E039 Hypothyroidism, unspecified: Secondary | ICD-10-CM

## 2023-11-17 DIAGNOSIS — E785 Hyperlipidemia, unspecified: Secondary | ICD-10-CM

## 2023-11-17 DIAGNOSIS — I10 Essential (primary) hypertension: Secondary | ICD-10-CM

## 2023-11-17 DIAGNOSIS — E559 Vitamin D deficiency, unspecified: Secondary | ICD-10-CM

## 2023-11-17 NOTE — Telephone Encounter (Signed)
 Lab order signed.  ___________________________________________ Thayer Ohm, DNP, APRN, FNP-BC Primary Care and Sports Medicine [facility]

## 2023-11-17 NOTE — Telephone Encounter (Signed)
 Copied from CRM [id number]. Topic: Clinical - Prescription Issue >> Nov 17, 2023 11:29 AM [name] wrote: Reason for CRM: The patient called in stating while she was on vacation she needed a prescription called in to [facility] but she has been back for a while and needed her SYMBICORT 160-4.5 MCG/ACT inhaler to be called into her local pharmacy  [facility] [address] [place], [place] - [address]  Phone: [phone] Fax: [phone]  Not the pharmacy in [place] in which for some reason it was called into on March 6th. I took that pharmacy off the patients file as she will not be using that again. Please redirect this refill to the appropriate local pharmacy and let her know when this has been done since she really needs this for her COPD.

## 2023-11-17 NOTE — Telephone Encounter (Signed)
 Pended labs for Provider approval.   Front Desk: Please contact patient and advise of lab orders after signed by [facility]. Thanks   Copied from CRM [id number]. Topic: Appointments - Appointment Scheduling >> Nov 17, 2023 11:43 AM [name] wrote: The patient called in to schedule an upcoming appt and would like an order to be put in for blood work a couple of days before so she can go over it with her provider at the appt. I will assist with scheduling and confirm the details. She has been scheduled next Monday 3/19 and would like to get the blood work done this week so please contact her to let her know the order has been put in so she can get it done. Please assist further

## 2023-11-23 DIAGNOSIS — E559 Vitamin D deficiency, unspecified: Secondary | ICD-10-CM | POA: Diagnosis not present

## 2023-11-23 DIAGNOSIS — E785 Hyperlipidemia, unspecified: Secondary | ICD-10-CM | POA: Diagnosis not present

## 2023-11-23 DIAGNOSIS — I10 Essential (primary) hypertension: Secondary | ICD-10-CM | POA: Diagnosis not present

## 2023-11-23 DIAGNOSIS — E039 Hypothyroidism, unspecified: Secondary | ICD-10-CM | POA: Diagnosis not present

## 2023-11-24 LAB — CBC
Hematocrit: 39.6 % (ref 34.0–46.6)
Hemoglobin: 13.5 g/dL (ref 11.1–15.9)
MCH: 33.1 pg — ABNORMAL HIGH (ref 26.6–33.0)
MCHC: 34.1 g/dL (ref 31.5–35.7)
MCV: 97 fL (ref 79–97)
Platelets: 208 10*3/uL (ref 150–450)
RBC: 4.08 x10E6/uL (ref 3.77–5.28)
RDW: 12.7 % (ref 11.7–15.4)
WBC: 5.9 10*3/uL (ref 3.4–10.8)

## 2023-11-24 LAB — LIPID PANEL
Chol/HDL Ratio: 3.4 ratio (ref 0.0–4.4)
Cholesterol, Total: 234 mg/dL — ABNORMAL HIGH (ref 100–199)
HDL: 69 mg/dL (ref >39)
LDL Chol Calc (NIH): 146 mg/dL — ABNORMAL HIGH (ref 0–99)
Triglycerides: 109 mg/dL (ref 0–149)
VLDL Cholesterol Cal: 19 mg/dL (ref 5–40)

## 2023-11-24 LAB — CMP14+EGFR
ALT: 13 IU/L (ref 0–32)
AST: 24 IU/L (ref 0–40)
Albumin: 4.1 g/dL (ref 3.8–4.8)
Alkaline Phosphatase: 62 IU/L (ref 44–121)
BUN/Creatinine Ratio: 14 (ref 12–28)
BUN: 10 mg/dL (ref 8–27)
Bilirubin Total: 0.8 mg/dL (ref 0.0–1.2)
CO2: 22 mmol/L (ref 20–29)
Calcium: 10 mg/dL (ref 8.7–10.3)
Chloride: 97 mmol/L (ref 96–106)
Creatinine, Ser: 0.74 mg/dL (ref 0.57–1.00)
Globulin, Total: 2.3 g/dL (ref 1.5–4.5)
Glucose: 87 mg/dL (ref 70–99)
Potassium: 3.9 mmol/L (ref 3.5–5.2)
Sodium: 135 mmol/L (ref 134–144)
Total Protein: 6.4 g/dL (ref 6.0–8.5)
eGFR: 84 mL/min/{1.73_m2} (ref >59)

## 2023-11-24 LAB — TSH: TSH: 5 u[IU]/mL — ABNORMAL HIGH (ref 0.450–4.500)

## 2023-11-24 LAB — VITAMIN D 25 HYDROXY (VIT D DEFICIENCY, FRACTURES): Vit D, 25-Hydroxy: 95.2 ng/mL (ref 30.0–100.0)

## 2023-11-25 ENCOUNTER — Encounter: Payer: Self-pay | Admitting: Family Medicine

## 2023-11-25 ENCOUNTER — Ambulatory Visit (INDEPENDENT_AMBULATORY_CARE_PROVIDER_SITE_OTHER): Admitting: Family Medicine

## 2023-11-25 VITALS — BP 137/84 | HR 103 | Ht 66.0 in | Wt 161.2 lb

## 2023-11-25 DIAGNOSIS — J432 Centrilobular emphysema: Secondary | ICD-10-CM | POA: Diagnosis not present

## 2023-11-25 DIAGNOSIS — E785 Hyperlipidemia, unspecified: Secondary | ICD-10-CM | POA: Diagnosis not present

## 2023-11-25 DIAGNOSIS — E039 Hypothyroidism, unspecified: Secondary | ICD-10-CM | POA: Diagnosis not present

## 2023-11-25 DIAGNOSIS — J449 Chronic obstructive pulmonary disease, unspecified: Secondary | ICD-10-CM | POA: Diagnosis not present

## 2023-11-25 DIAGNOSIS — I10 Essential (primary) hypertension: Secondary | ICD-10-CM

## 2023-11-25 MED ORDER — LEVOTHYROXINE SODIUM 50 MCG PO TABS
50.0000 ug | ORAL_TABLET | Freq: Every day | ORAL | 1 refills | Status: DC
Start: 2023-11-25 — End: 2024-06-06

## 2023-11-25 MED ORDER — SYMBICORT 160-4.5 MCG/ACT IN AERO: 2.0000 | INHALATION_SPRAY | Freq: Every day | RESPIRATORY_TRACT | 11 refills | Status: DC

## 2023-11-25 NOTE — Progress Notes (Signed)
 Jennifer [name] - 78 y.o. female MRN [id number]  Date of birth: Jun 25, 1946  Subjective Chief Complaint  Patient presents with   Results    HPI Jennifer [name] is a 78 year old female here today for follow-up visit.  She had labs completed prior to visit today and her TSH was elevated.  She is taking 25 mcg of levothyroxine on a regular basis, first thing on empty stomach.  Her cholesterol has increased some.  She is aware of dietary changes to help with this.  Blood pressure is well-controlled on lisinopril and hydrochlorothiazide.  Denies chest pain, shortness of breath, palpitations, headaches or vision changes.   She is using Symbicort on a more regular basis at this point.  She reports that her breathing is much better with regular use of this.  ROS:  A comprehensive ROS was completed and negative except as noted per HPI  Allergies  Allergen Reactions   Prednisone Other (See Comments)    psychological reaction, hallucinations, Paranoia   Grass Pollen(K-O-R-T-Swt Vern) Other (See Comments)   Statins Other (See Comments)    Other reaction(s): muscle/joint pain Weakness and tiredness    Past Medical History:  Diagnosis Date   Anxiety    Arthritis    Asthma    COPD (chronic obstructive pulmonary disease) (HCC) 11/03/2018   Dyspnea    aT TIMES WITH ASTHMA ATTACKS   Familial hypercholanemia 11/03/2018   HLD (hyperlipidemia) 11/03/2018   HTN (hypertension) 11/03/2018   Hypothyroid 11/03/2018   Pneumonia    Vitamin D deficiency 11/03/2018   Von Willebrand disease (HCC) possible 11/03/2018   Seen in documentation from prior provider    Past Surgical History:  Procedure Laterality Date   APPENDECTOMY  1978   BIOPSY ENDOMETRIAL     CESAREAN SECTION     triplets   TOTAL HIP ARTHROPLASTY Right 08/02/2019   Procedure: TOTAL  RIGHT  HIP ARTHROPLASTY ANTERIOR APPROACH;  Surgeon: Durene Romans, MD;  Location: [facility];  Service: Orthopedics;  Laterality: Right;  70 mins   TOTAL  HIP ARTHROPLASTY Left 09/06/2019   Procedure: TOTAL HIP ARTHROPLASTY ANTERIOR APPROACH;  Surgeon: Durene Romans, MD;  Location: [facility];  Service: Orthopedics;  Laterality: Left;  70 mins   TUBAL LIGATION      Social History   Socioeconomic History   Marital status: Single    Spouse name: Not on file   Number of children: Not on file   Years of education: Not on file   Highest education level: Not on file  Occupational History   Not on file  Tobacco Use   Smoking status: Former    Current packs/day: 0.00    Average packs/day: 0.5 packs/day for 15.0 years (7.5 ttl pk-yrs)    Types: Cigarettes    Start date: 07/23/1999    Quit date: 07/22/2014    Years since quitting: 9.3   Smokeless tobacco: Never  Vaping Use   Vaping status: Never Used  Substance and Sexual Activity   Alcohol use: Not Currently   Drug use: Never   Sexual activity: Not Currently    Birth control/protection: Abstinence  Other Topics Concern   Not on file  Social History Narrative   Not on file   Social Drivers of Health   Financial Resource Strain: Not on file  Food Insecurity: Not on file  Transportation Needs: Not on file  Physical Activity: Not on file  Stress: Not on file  Social Connections: Unknown (01/21/2022)   [facility]  Social Network    Social Network: Not on file    Family History  Problem Relation Age of Onset   Parkinson's disease Mother    Cancer Father        prostate   Asthma Brother     Health Maintenance  Topic Date Due   Medicare Annual Wellness (AWV)  12/26/2023 (Originally 1946-03-27)   Zoster Vaccines- Shingrix (1 of 2) 02/27/2024 (Originally 07/22/1998)   COVID-19 Vaccine (3 - 2024-25 season) 05/12/2024 (Originally 05/10/2023)   DEXA SCAN  11/24/2024 (Originally 07/22/2013)   Colonoscopy  11/24/2024 (Originally 07/22/1993)   DTaP/Tdap/Td (2 - Td or Tdap) 03/04/2027   Pneumonia Vaccine 32+ Years old  Completed   Hepatitis C Screening   Completed   HPV VACCINES  Aged Out   INFLUENZA VACCINE  Discontinued     ----------------------------------------------------------------------------------------------------------------------------------------------------------------------------------------------------------------- Physical Exam BP 137/84 (BP Location: Left Arm, Patient Position: Sitting, Cuff Size: Normal)   Pulse (!) 103   Ht 5\' 6"  (1.676 m)   Wt 161 lb 3.2 oz (73.1 kg)   SpO2 95%   BMI 26.02 kg/m   Physical Exam Constitutional:      Appearance: Normal appearance.  HENT:     Head: Normocephalic and atraumatic.  Eyes:     General: No scleral icterus. Cardiovascular:     Rate and Rhythm: Normal rate and regular rhythm.  Pulmonary:     Effort: Pulmonary effort is normal.     Breath sounds: Normal breath sounds.  Musculoskeletal:     Cervical back: Neck supple.  Neurological:     Mental Status: She is alert.  Psychiatric:        Mood and Affect: Mood normal.        Behavior: Behavior normal.     ------------------------------------------------------------------------------------------------------------------------------------------------------------------------------------------------------------------- Assessment and Plan  Hypothyroid Recent TSH was elevated.  Increasing levothyroxine to 50 mcg daily.  Recheck TSH in 6 to 8 weeks.  Hypertension goal BP (blood pressure) < 140/90 Blood pressure is well-controlled at this time.  She will continue lisinopril/hydrochlorothiazide at current strength.  HLD (hyperlipidemia) Intolerant to statins.  Her cholesterol has increased since last visit.  Encouraged to work on dietary changes to help with improvement of cholesterol.  COPD (chronic obstructive pulmonary disease) (HCC) Continue Symbicort daily.  Albuterol as needed.   Meds ordered this encounter  Medications   levothyroxine (SYNTHROID) 50 MCG tablet    Sig: Take 1 tablet (50 mcg total) by mouth  daily.    Dispense:  90 tablet    Refill:  1   SYMBICORT 160-4.5 MCG/ACT inhaler    Sig: Inhale 2 puffs into the lungs daily.    Dispense:  11 g    Refill:  11    Return in about 9 months (around 08/26/2024) for Hypertension.    This visit occurred during the SARS-CoV-2 public health emergency.  Safety protocols were in place, including screening questions prior to the visit, additional usage of staff PPE, and extensive cleaning of exam room while observing appropriate contact time as indicated for disinfecting solutions.

## 2023-11-25 NOTE — Progress Notes (Signed)
 Pt state she will not participate in preventative screening tests. "If I"m sick, I should show some signs."

## 2023-11-25 NOTE — Assessment & Plan Note (Signed)
 Intolerant to statins.  Her cholesterol has increased since last visit.  Encouraged to work on dietary changes to help with improvement of cholesterol.

## 2023-11-25 NOTE — Assessment & Plan Note (Signed)
 Continue Symbicort daily.  Albuterol as needed.

## 2023-11-25 NOTE — Assessment & Plan Note (Signed)
 Blood pressure is well-controlled at this time.  She will continue lisinopril/hydrochlorothiazide at current strength.

## 2023-11-25 NOTE — Assessment & Plan Note (Signed)
 Recent TSH was elevated.  Increasing levothyroxine to 50 mcg daily.  Recheck TSH in 6 to 8 weeks.

## 2023-12-28 ENCOUNTER — Telehealth: Payer: Self-pay

## 2023-12-28 ENCOUNTER — Ambulatory Visit: Payer: Self-pay

## 2023-12-28 DIAGNOSIS — J432 Centrilobular emphysema: Secondary | ICD-10-CM

## 2023-12-28 MED ORDER — SYMBICORT 160-4.5 MCG/ACT IN AERO: 2.0000 | INHALATION_SPRAY | Freq: Two times a day (BID) | RESPIRATORY_TRACT | 11 refills | Status: DC

## 2023-12-28 NOTE — Telephone Encounter (Signed)
 [facility] pharmacy called and states the prescription for the Symbicort  prescription has been changed from 2 puffs twice daily to 2 puffs once daily. They wanted to know if this change was correct.

## 2023-12-28 NOTE — Telephone Encounter (Signed)
 Copied from CRM [id number]. Topic: Clinical - Prescription Issue >> Dec 28, 2023 10:26 AM Lenon Radar A wrote: Reason for CRM: Patient called in regarding medication SYMBICORT  160-4.5 MCG/ACT inhaler. Patient stated that it needs to be re-written to show 4 puffs for 30 days not 2 puffs for 60 days. Please correct as patient is being charged more due to how the prescription was written. Please contact patient for additional assistance.

## 2024-01-14 ENCOUNTER — Other Ambulatory Visit: Payer: Self-pay | Admitting: Family Medicine

## 2024-02-05 DIAGNOSIS — E039 Hypothyroidism, unspecified: Secondary | ICD-10-CM | POA: Diagnosis not present

## 2024-02-06 LAB — TSH: TSH: 1.56 u[IU]/mL (ref 0.450–4.500)

## 2024-02-17 ENCOUNTER — Ambulatory Visit: Payer: Self-pay | Admitting: Medical-Surgical

## 2024-04-26 ENCOUNTER — Other Ambulatory Visit: Payer: Self-pay | Admitting: Family Medicine

## 2024-05-05 NOTE — Progress Notes (Signed)
 Pharmacy Quality Measure Review  This patient is appearing on a report for being at risk of failing the adherence measure for hypertension (ACEi/ARB) medications this calendar year.   Medication: lisinopril -hydrochlorothiazide  20-25 mg daily Last fill date: 04/26/2024 for 90 day supply  Insurance report was not up to date. No action needed at this time.   Woodie Jock, PharmD PGY1 Pharmacy Resident

## 2024-05-12 ENCOUNTER — Encounter: Payer: Self-pay | Admitting: Sports Medicine

## 2024-05-27 ENCOUNTER — Telehealth: Payer: Self-pay

## 2024-05-27 NOTE — Telephone Encounter (Signed)
 Copied from CRM [id number]. Topic: Clinical - Request for Lab/Test Order >> May 27, 2024 11:39 AM Farrel B wrote: Reason for CRM: Patient is calling in regards to labs, she stated her 6 month f/u has been scheduled for 9/29, she would like her labs done the week before so that she and the provider can communicate about the blood work   Please call patient and advise [phone] (H)

## 2024-05-31 ENCOUNTER — Other Ambulatory Visit: Payer: Self-pay | Admitting: Family Medicine

## 2024-05-31 DIAGNOSIS — E785 Hyperlipidemia, unspecified: Secondary | ICD-10-CM

## 2024-05-31 DIAGNOSIS — E039 Hypothyroidism, unspecified: Secondary | ICD-10-CM

## 2024-05-31 DIAGNOSIS — I10 Essential (primary) hypertension: Secondary | ICD-10-CM

## 2024-05-31 DIAGNOSIS — E559 Vitamin D deficiency, unspecified: Secondary | ICD-10-CM

## 2024-06-01 DIAGNOSIS — E039 Hypothyroidism, unspecified: Secondary | ICD-10-CM | POA: Diagnosis not present

## 2024-06-01 DIAGNOSIS — I10 Essential (primary) hypertension: Secondary | ICD-10-CM | POA: Diagnosis not present

## 2024-06-01 DIAGNOSIS — E785 Hyperlipidemia, unspecified: Secondary | ICD-10-CM | POA: Diagnosis not present

## 2024-06-01 DIAGNOSIS — E559 Vitamin D deficiency, unspecified: Secondary | ICD-10-CM | POA: Diagnosis not present

## 2024-06-01 NOTE — Telephone Encounter (Signed)
 Patient aware labs have been entered and she can come at her convenience for the lab draw.

## 2024-06-02 LAB — CMP14+EGFR
ALT: 14 IU/L (ref 0–32)
AST: 25 IU/L (ref 0–40)
Albumin: 4 g/dL (ref 3.8–4.8)
Alkaline Phosphatase: 60 IU/L (ref 49–135)
BUN/Creatinine Ratio: 20 (ref 12–28)
BUN: 17 mg/dL (ref 8–27)
Bilirubin Total: 0.8 mg/dL (ref 0.0–1.2)
CO2: 23 mmol/L (ref 20–29)
Calcium: 10.4 mg/dL — ABNORMAL HIGH (ref 8.7–10.3)
Chloride: 100 mmol/L (ref 96–106)
Creatinine, Ser: 0.86 mg/dL (ref 0.57–1.00)
Globulin, Total: 2.5 g/dL (ref 1.5–4.5)
Glucose: 95 mg/dL (ref 70–99)
Potassium: 3.9 mmol/L (ref 3.5–5.2)
Sodium: 139 mmol/L (ref 134–144)
Total Protein: 6.5 g/dL (ref 6.0–8.5)
eGFR: 70 mL/min/1.73 (ref >59)

## 2024-06-02 LAB — CBC WITH DIFFERENTIAL/PLATELET
Basophils Absolute: 0.1 x10E3/uL (ref 0.0–0.2)
Basos: 1 %
EOS (ABSOLUTE): 0.2 x10E3/uL (ref 0.0–0.4)
Eos: 4 %
Hematocrit: 40.3 % (ref 34.0–46.6)
Hemoglobin: 13.4 g/dL (ref 11.1–15.9)
Immature Grans (Abs): 0 x10E3/uL (ref 0.0–0.1)
Immature Granulocytes: 0 %
Lymphocytes Absolute: 2.3 x10E3/uL (ref 0.7–3.1)
Lymphs: 37 %
MCH: 33.5 pg — ABNORMAL HIGH (ref 26.6–33.0)
MCHC: 33.3 g/dL (ref 31.5–35.7)
MCV: 101 fL — ABNORMAL HIGH (ref 79–97)
Monocytes Absolute: 0.7 x10E3/uL (ref 0.1–0.9)
Monocytes: 12 %
Neutrophils Absolute: 3 x10E3/uL (ref 1.4–7.0)
Neutrophils: 46 %
Platelets: 177 x10E3/uL (ref 150–450)
RBC: 4 x10E6/uL (ref 3.77–5.28)
RDW: 12.4 % (ref 11.7–15.4)
WBC: 6.3 x10E3/uL (ref 3.4–10.8)

## 2024-06-02 LAB — LIPID PANEL WITH LDL/HDL RATIO
Cholesterol, Total: 223 mg/dL — ABNORMAL HIGH (ref 100–199)
HDL: 67 mg/dL (ref >39)
LDL Chol Calc (NIH): 136 mg/dL — ABNORMAL HIGH (ref 0–99)
LDL/HDL Ratio: 2 ratio (ref 0.0–3.2)
Triglycerides: 113 mg/dL (ref 0–149)
VLDL Cholesterol Cal: 20 mg/dL (ref 5–40)

## 2024-06-02 LAB — TSH: TSH: 4.21 u[IU]/mL (ref 0.450–4.500)

## 2024-06-02 LAB — VITAMIN D 25 HYDROXY (VIT D DEFICIENCY, FRACTURES): Vit D, 25-Hydroxy: 110 ng/mL — ABNORMAL HIGH (ref 30.0–100.0)

## 2024-06-06 ENCOUNTER — Encounter: Payer: Self-pay | Admitting: Family Medicine

## 2024-06-06 ENCOUNTER — Ambulatory Visit: Admitting: Family Medicine

## 2024-06-06 VITALS — BP 115/73 | HR 99 | Ht 66.0 in | Wt 162.3 lb

## 2024-06-06 DIAGNOSIS — E559 Vitamin D deficiency, unspecified: Secondary | ICD-10-CM | POA: Diagnosis not present

## 2024-06-06 DIAGNOSIS — E785 Hyperlipidemia, unspecified: Secondary | ICD-10-CM

## 2024-06-06 DIAGNOSIS — E039 Hypothyroidism, unspecified: Secondary | ICD-10-CM

## 2024-06-06 DIAGNOSIS — J449 Chronic obstructive pulmonary disease, unspecified: Secondary | ICD-10-CM | POA: Diagnosis not present

## 2024-06-06 DIAGNOSIS — I10 Essential (primary) hypertension: Secondary | ICD-10-CM

## 2024-06-06 DIAGNOSIS — J432 Centrilobular emphysema: Secondary | ICD-10-CM

## 2024-06-06 DIAGNOSIS — Z9109 Other allergy status, other than to drugs and biological substances: Secondary | ICD-10-CM

## 2024-06-06 MED ORDER — LISINOPRIL-HYDROCHLOROTHIAZIDE 20-25 MG PO TABS: 1.0000 | ORAL_TABLET | Freq: Every day | ORAL | 3 refills | Status: AC

## 2024-06-06 MED ORDER — MONTELUKAST SODIUM 10 MG PO TABS: 10.0000 mg | ORAL_TABLET | Freq: Every day | ORAL | 3 refills | Status: AC

## 2024-06-06 MED ORDER — LEVOTHYROXINE SODIUM 50 MCG PO TABS: 50.0000 ug | ORAL_TABLET | Freq: Every day | ORAL | 3 refills | Status: AC

## 2024-06-06 MED ORDER — SYMBICORT 160-4.5 MCG/ACT IN AERO: 2.0000 | INHALATION_SPRAY | Freq: Two times a day (BID) | RESPIRATORY_TRACT | 11 refills | Status: AC

## 2024-06-06 MED ORDER — ALBUTEROL SULFATE HFA 108 (90 BASE) MCG/ACT IN AERS: INHALATION_SPRAY | RESPIRATORY_TRACT | 3 refills | Status: AC

## 2024-06-06 NOTE — Assessment & Plan Note (Signed)
 Continue Symbicort daily.  Albuterol as needed.

## 2024-06-06 NOTE — Progress Notes (Signed)
 Jennifer [name] - 78 y.o. female MRN [id number]  Date of birth: 1946/06/28  Subjective Chief Complaint  Patient presents with   Hypotension    HPI Jennifer [name] is a 78 y.o. female here today for follow up visit.   She reports that she is doing pretty well.   She continues on lisinopril /hydrochlorothiazide  for management of HTN.  BP is well controlled.   She denies side effects fro medication at current strength.  She has not had chest pain, shortness of breath, palpitations, headache or vision changes.   She remains on symbicort  and singulair  daily for management of COPD/Asthma overalp.  She is a former smoker.  She does have O2 at home that she uses PRN.   Doing well with current strength of levothyroxine .  TSH WNL on recent labs.   ROS:  A comprehensive ROS was completed and negative except as noted per HPI    Allergies  Allergen Reactions   Prednisone Other (See Comments)    psychological reaction, hallucinations, Paranoia   Grass Pollen([id number] Vern) Other (See Comments)   Statins Other (See Comments)    Other reaction(s): muscle/joint pain Weakness and tiredness    Past Medical History:  Diagnosis Date   Anxiety    Arthritis    Asthma    COPD (chronic obstructive pulmonary disease) (HCC) 11/03/2018   Dyspnea    aT TIMES WITH ASTHMA ATTACKS   Familial hypercholanemia 11/03/2018   HLD (hyperlipidemia) 11/03/2018   HTN (hypertension) 11/03/2018   Hypothyroid 11/03/2018   Pneumonia    Vitamin D  deficiency 11/03/2018   Von Willebrand disease (HCC) possible 11/03/2018   Seen in documentation from prior provider    Past Surgical History:  Procedure Laterality Date   APPENDECTOMY  1978   BIOPSY ENDOMETRIAL     CESAREAN SECTION     triplets   TOTAL HIP ARTHROPLASTY Right 08/02/2019   Procedure: TOTAL  RIGHT  HIP ARTHROPLASTY ANTERIOR APPROACH;  Surgeon: Ernie Cough, MD;  Location: [facility];  Service: Orthopedics;  Laterality: Right;  70 mins   TOTAL HIP  ARTHROPLASTY Left 09/06/2019   Procedure: TOTAL HIP ARTHROPLASTY ANTERIOR APPROACH;  Surgeon: Ernie Cough, MD;  Location: [facility];  Service: Orthopedics;  Laterality: Left;  70 mins   TUBAL LIGATION      Social History   Socioeconomic History   Marital status: Single    Spouse name: Not on file   Number of children: Not on file   Years of education: Not on file   Highest education level: 12th grade  Occupational History   Not on file  Tobacco Use   Smoking status: Former    Current packs/day: 0.00    Average packs/day: 0.5 packs/day for 15.0 years (7.5 ttl pk-yrs)    Types: Cigarettes    Start date: 07/23/1999    Quit date: 07/22/2014    Years since quitting: 9.8   Smokeless tobacco: Never  Vaping Use   Vaping status: Never Used  Substance and Sexual Activity   Alcohol use: Not Currently   Drug use: Never   Sexual activity: Not Currently    Birth control/protection: Abstinence  Other Topics Concern   Not on file  Social History Narrative   Not on file   Social Drivers of Health   Financial Resource Strain: Patient Declined (06/02/2024)   Overall Financial Resource Strain (CARDIA)    Difficulty of Paying Living Expenses: Patient declined  Food Insecurity: Patient Declined (06/02/2024)   Hunger Vital Sign    Worried  About Running Out of Food in the Last Year: Patient declined    Ran Out of Food in the Last Year: Patient declined  Transportation Needs: No Transportation Needs (06/02/2024)   PRAPARE - Administrator, Civil Service (Medical): No    Lack of Transportation (Non-Medical): No  Physical Activity: Insufficiently Active (06/02/2024)   Exercise Vital Sign    Days of Exercise per Week: 3 days    Minutes of Exercise per Session: 30 min  Stress: No Stress Concern Present (06/02/2024)   [employer] of Occupational Health - Occupational Stress Questionnaire    Feeling of Stress: Not at all  Social Connections: Unknown (06/02/2024)   Social  Connection and Isolation Panel    Frequency of Communication with Friends and Family: More than three times a week    Frequency of Social Gatherings with Friends and Family: Once a week    Attends Religious Services: Patient declined    Database administrator or Organizations: No    Attends Engineer, structural: Not on file    Marital Status: Patient declined    Family History  Problem Relation Age of Onset   Parkinson's disease Mother    Cancer Father        prostate   Asthma Brother     Health Maintenance  Topic Date Due   Medicare Annual Wellness (AWV)  Never done   Zoster Vaccines- Shingrix (1 of 2) Never done   COVID-19 Vaccine (3 - 2025-26 season) 05/09/2024   DEXA SCAN  11/24/2024 (Originally 07/22/2013)   Colonoscopy  11/24/2024 (Originally 07/22/1993)   DTaP/Tdap/Td (2 - Td or Tdap) 03/04/2027   Pneumococcal Vaccine: 50+ Years  Completed   Hepatitis C Screening  Completed   HPV VACCINES  Aged Out   Meningococcal B Vaccine  Aged Out   Influenza Vaccine  Discontinued     ----------------------------------------------------------------------------------------------------------------------------------------------------------------------------------------------------------------- Physical Exam BP 115/73 (BP Location: Left Arm, Patient Position: Sitting, Cuff Size: Normal)   Pulse 99   Ht 5' 6 (1.676 m)   Wt 162 lb 4.8 oz (73.6 kg)   SpO2 95%   BMI 26.20 kg/m   Physical Exam Constitutional:      Appearance: Normal appearance.  HENT:     Head: Normocephalic and atraumatic.  Cardiovascular:     Rate and Rhythm: Normal rate and regular rhythm.  Pulmonary:     Effort: Pulmonary effort is normal.     Breath sounds: Normal breath sounds.  Musculoskeletal:     Cervical back: Neck supple.  Neurological:     General: No focal deficit present.     Mental Status: She is alert.  Psychiatric:        Mood and Affect: Mood normal.        Behavior: Behavior  normal.     ------------------------------------------------------------------------------------------------------------------------------------------------------------------------------------------------------------------- Assessment and Plan  COPD (chronic obstructive pulmonary disease) (HCC) Continue Symbicort  daily.  Albuterol  as needed.  Hypertension goal BP (blood pressure) < 140/90 Blood pressure is well-controlled at this time.  She will continue lisinopril /hydrochlorothiazide  at current strength.  HLD (hyperlipidemia) Intolerant to statins.  Her cholesterol has improved some since last visit  Encouraged to work on dietary changes to help with improvement of cholesterol.  Vitamin D  deficiency Vitamin d  levels are high on recent labs.  She will decrease supplement.   Hypothyroid Recent TSH WNL.  Continue levothyroxine  at current strength.    Meds ordered this encounter  Medications   albuterol  (VENTOLIN  HFA) 108 (90 Base) MCG/ACT  inhaler    Sig: INHALE 1-2 PUFFS INTO THE LUNGS EVERY 4 HOURS AS NEEDED FOR WHEEZING OR SHORTNESS OF BREATH.    Dispense:  17 each    Refill:  3   levothyroxine  (SYNTHROID ) 50 MCG tablet    Sig: Take 1 tablet (50 mcg total) by mouth daily.    Dispense:  90 tablet    Refill:  3   lisinopril -hydrochlorothiazide  (ZESTORETIC ) 20-25 MG tablet    Sig: Take 1 tablet by mouth daily.    Dispense:  90 tablet    Refill:  3   montelukast  (SINGULAIR ) 10 MG tablet    Sig: Take 1 tablet (10 mg total) by mouth at bedtime. TAKE 1 TABLET BY MOUTH EVERYDAY AT BEDTIME    Dispense:  90 tablet    Refill:  3   SYMBICORT  160-4.5 MCG/ACT inhaler    Sig: Inhale 2 puffs into the lungs in the Jennifer and at bedtime.    Dispense:  11 g    Refill:  11    Return in about 6 months (around 12/04/2024) for Hypertension.

## 2024-06-06 NOTE — Assessment & Plan Note (Signed)
 Recent TSH WNL.  Continue levothyroxine  at current strength.

## 2024-06-06 NOTE — Assessment & Plan Note (Signed)
 Blood pressure is well-controlled at this time.  She will continue lisinopril/hydrochlorothiazide at current strength.

## 2024-06-06 NOTE — Assessment & Plan Note (Signed)
 Intolerant to statins.  Her cholesterol has improved some since last visit  Encouraged to work on dietary changes to help with improvement of cholesterol.

## 2024-06-06 NOTE — Assessment & Plan Note (Signed)
 Vitamin d  levels are high on recent labs.  She will decrease supplement.
